# Patient Record
Sex: Male | Born: 1950 | Race: Black or African American | Hispanic: No | Marital: Single
Health system: Midwestern US, Community
[De-identification: ages and names within clinical notes are randomized; demographics above are authoritative.]

## PROBLEM LIST (undated history)

## (undated) DIAGNOSIS — I714 Abdominal aortic aneurysm, without rupture, unspecified: Secondary | ICD-10-CM

## (undated) DIAGNOSIS — K922 Gastrointestinal hemorrhage, unspecified: Secondary | ICD-10-CM

## (undated) DIAGNOSIS — E785 Hyperlipidemia, unspecified: Secondary | ICD-10-CM

## (undated) DIAGNOSIS — I10 Essential (primary) hypertension: Secondary | ICD-10-CM

## (undated) DIAGNOSIS — K219 Gastro-esophageal reflux disease without esophagitis: Secondary | ICD-10-CM

## (undated) DIAGNOSIS — E7800 Pure hypercholesterolemia, unspecified: Secondary | ICD-10-CM

## (undated) DIAGNOSIS — R3914 Feeling of incomplete bladder emptying: Secondary | ICD-10-CM

## (undated) DIAGNOSIS — K2961 Other gastritis with bleeding: Secondary | ICD-10-CM

## (undated) DIAGNOSIS — Z87891 Personal history of nicotine dependence: Secondary | ICD-10-CM

## (undated) DIAGNOSIS — N401 Enlarged prostate with lower urinary tract symptoms: Secondary | ICD-10-CM

## (undated) DIAGNOSIS — D509 Iron deficiency anemia, unspecified: Secondary | ICD-10-CM

## (undated) HISTORY — DX: Hyperlipidemia, unspecified: E78.5

## (undated) HISTORY — DX: Gastrointestinal hemorrhage, unspecified: K92.2

## (undated) HISTORY — DX: Gastro-esophageal reflux disease without esophagitis: K21.9

## (undated) HISTORY — DX: Essential (primary) hypertension: I10

## (undated) HISTORY — PX: OTHER SURGICAL HISTORY: SHX169

## (undated) HISTORY — DX: Abdominal aortic aneurysm, without rupture: I71.4

## (undated) MED ORDER — ENALAPRIL-HYDROCHLOROTHIAZIDE 5 MG-12.5 MG TAB
ORAL_TABLET | ORAL | Status: DC
Start: ? — End: 2012-12-14

## (undated) MED ORDER — LOVASTATIN 20 MG TAB
20 mg | ORAL_TABLET | Freq: Every evening | ORAL | Status: DC
Start: ? — End: 2013-11-01

## (undated) MED ORDER — RANITIDINE 150 MG CAP
150 mg | ORAL_CAPSULE | Freq: Two times a day (BID) | ORAL | Status: DC
Start: ? — End: 2013-05-31

## (undated) MED ORDER — ENALAPRIL-HYDROCHLOROTHIAZIDE 5 MG-12.5 MG TAB
ORAL_TABLET | ORAL | Status: DC
Start: ? — End: 2012-08-31

## (undated) MED ORDER — PRAVASTATIN 40 MG TAB
40 mg | ORAL_TABLET | ORAL | Status: DC
Start: ? — End: 2012-12-14

## (undated) MED ORDER — RANITIDINE 150 MG CAP
150 mg | ORAL_CAPSULE | Freq: Two times a day (BID) | ORAL | Status: DC
Start: ? — End: 2012-12-14

## (undated) MED ORDER — LOVASTATIN 20 MG TAB
20 mg | ORAL_TABLET | Freq: Every evening | ORAL | Status: DC
Start: ? — End: 2013-05-31

## (undated) MED ORDER — ENALAPRIL-HYDROCHLOROTHIAZIDE 5 MG-12.5 MG TAB
ORAL_TABLET | ORAL | Status: DC
Start: ? — End: 2013-11-01

## (undated) MED ORDER — ENALAPRIL-HYDROCHLOROTHIAZIDE 5 MG-12.5 MG TAB
ORAL_TABLET | ORAL | Status: DC
Start: ? — End: 2013-05-31

## (undated) MED ORDER — RANITIDINE 150 MG CAP
150 mg | ORAL_CAPSULE | Freq: Two times a day (BID) | ORAL | Status: DC | PRN
Start: ? — End: 2013-11-01

## (undated) MED ORDER — PRAVASTATIN 20 MG TAB
20 mg | ORAL_TABLET | ORAL | Status: DC
Start: ? — End: 2012-08-31

## (undated) MED ORDER — RANITIDINE 150 MG CAP
150 mg | ORAL_CAPSULE | Freq: Two times a day (BID) | ORAL | Status: DC
Start: ? — End: 2012-08-31

---

## 2009-10-09 LAB — METABOLIC PANEL, COMPREHENSIVE
A-G Ratio: 1 — ABNORMAL LOW (ref 1.1–2.2)
ALT (SGPT): 29 U/L (ref 12–78)
AST (SGOT): 17 U/L (ref 15–37)
Albumin: 4 g/dL (ref 3.5–5.0)
Alk. phosphatase: 103 U/L (ref 50–136)
Anion gap: 8 mmol/L (ref 5–15)
BUN/Creatinine ratio: 12 (ref 12–20)
BUN: 11 MG/DL (ref 6–20)
Bilirubin, total: 0.7 MG/DL (ref 0.2–1.0)
CO2: 27 MMOL/L (ref 21–32)
Calcium: 9.9 MG/DL (ref 8.5–10.1)
Chloride: 104 MMOL/L (ref 97–108)
Creatinine: 0.9 MG/DL (ref 0.6–1.3)
GFR est AA: >60 mL/min/{1.73_m2} (ref >60)
GFR est non-AA: >60 mL/min/{1.73_m2} (ref >60)
Globulin: 3.9 g/dL (ref 2.0–4.0)
Glucose: 78 MG/DL (ref 65–100)
Potassium: 4.6 MMOL/L (ref 3.5–5.1)
Protein, total: 7.9 g/dL (ref 6.4–8.2)
Sodium: 139 MMOL/L (ref 136–145)

## 2009-10-09 LAB — LIPID PANEL
CHOL/HDL Ratio: 6.4 — ABNORMAL HIGH (ref 0–5.0)
Cholesterol, total: 242 MG/DL — ABNORMAL HIGH (ref <200)
HDL Cholesterol: 38 MG/DL
LDL, calculated: 188.2 MG/DL — ABNORMAL HIGH (ref 0–100)
Triglyceride: 79 MG/DL (ref <150)
VLDL, calculated: 15.8 MG/DL

## 2010-11-18 LAB — METABOLIC PANEL, COMPREHENSIVE
A-G Ratio: 0.9 — ABNORMAL LOW (ref 1.1–2.2)
ALT (SGPT): 28 U/L (ref 12–78)
AST (SGOT): 20 U/L (ref 15–37)
Albumin: 4.1 g/dL (ref 3.5–5.0)
Alk. phosphatase: 106 U/L (ref 50–136)
Anion gap: 9 mmol/L (ref 5–15)
BUN/Creatinine ratio: 18 (ref 12–20)
BUN: 16 MG/DL (ref 6–20)
Bilirubin, total: 1 MG/DL (ref 0.2–1.0)
CO2: 27 MMOL/L (ref 21–32)
Calcium: 9.4 MG/DL (ref 8.5–10.1)
Chloride: 102 MMOL/L (ref 97–108)
Creatinine: 0.9 MG/DL (ref 0.6–1.3)
GFR est AA: >60 mL/min/{1.73_m2} (ref >60)
GFR est non-AA: >60 mL/min/{1.73_m2} (ref >60)
Globulin: 4.5 g/dL — ABNORMAL HIGH (ref 2.0–4.0)
Glucose: 88 MG/DL (ref 65–100)
Potassium: 4.6 MMOL/L (ref 3.5–5.1)
Protein, total: 8.6 g/dL — ABNORMAL HIGH (ref 6.4–8.2)
Sodium: 138 MMOL/L (ref 136–145)

## 2010-11-18 LAB — LIPID PANEL
CHOL/HDL Ratio: 4.4 (ref 0–5.0)
Cholesterol, total: 160 MG/DL (ref <200)
HDL Cholesterol: 36 MG/DL
LDL, calculated: 107.6 MG/DL — ABNORMAL HIGH (ref 0–100)
Triglyceride: 82 MG/DL (ref <150)
VLDL, calculated: 16.4 MG/DL

## 2011-03-31 LAB — METABOLIC PANEL, COMPREHENSIVE
A-G Ratio: 0.9 — ABNORMAL LOW (ref 1.1–2.2)
ALT (SGPT): 20 U/L (ref 12–78)
AST (SGOT): 12 U/L — ABNORMAL LOW (ref 15–37)
Albumin: 4.3 g/dL (ref 3.5–5.0)
Alk. phosphatase: 131 U/L (ref 50–136)
Anion gap: 9 mmol/L (ref 5–15)
BUN/Creatinine ratio: 13 (ref 12–20)
BUN: 13 MG/DL (ref 6–20)
Bilirubin, total: 0.9 MG/DL (ref 0.2–1.0)
CO2: 32 MMOL/L (ref 21–32)
Calcium: 10 MG/DL (ref 8.5–10.1)
Chloride: 101 MMOL/L (ref 97–108)
Creatinine: 1 MG/DL (ref 0.6–1.3)
GFR est AA: >60 mL/min/{1.73_m2} (ref >60)
GFR est non-AA: >60 mL/min/{1.73_m2} (ref >60)
Globulin: 4.6 g/dL — ABNORMAL HIGH (ref 2.0–4.0)
Glucose: 75 MG/DL (ref 65–100)
Potassium: 4.5 MMOL/L (ref 3.5–5.1)
Protein, total: 8.9 g/dL — ABNORMAL HIGH (ref 6.4–8.2)
Sodium: 142 MMOL/L (ref 136–145)

## 2011-08-20 LAB — METABOLIC PANEL, COMPREHENSIVE
A-G Ratio: 0.9 — ABNORMAL LOW (ref 1.1–2.2)
ALT (SGPT): 22 U/L (ref 12–78)
AST (SGOT): 12 U/L — ABNORMAL LOW (ref 15–37)
Albumin: 4 g/dL (ref 3.5–5.0)
Alk. phosphatase: 106 U/L (ref 50–136)
Anion gap: 7 mmol/L (ref 5–15)
BUN/Creatinine ratio: 18 (ref 12–20)
BUN: 16 MG/DL (ref 6–20)
Bilirubin, total: 0.8 MG/DL (ref 0.2–1.0)
CO2: 28 MMOL/L (ref 21–32)
Calcium: 9.6 MG/DL (ref 8.5–10.1)
Chloride: 102 MMOL/L (ref 97–108)
Creatinine: 0.87 MG/DL (ref 0.45–1.15)
GFR est AA: >60 mL/min/{1.73_m2} (ref >60)
GFR est non-AA: >60 mL/min/{1.73_m2} (ref >60)
Globulin: 4.5 g/dL — ABNORMAL HIGH (ref 2.0–4.0)
Glucose: 86 MG/DL (ref 65–100)
Potassium: 4.8 MMOL/L (ref 3.5–5.1)
Protein, total: 8.5 g/dL — ABNORMAL HIGH (ref 6.4–8.2)
Sodium: 137 MMOL/L (ref 136–145)

## 2011-11-04 DIAGNOSIS — K219 Gastro-esophageal reflux disease without esophagitis: Secondary | ICD-10-CM

## 2011-11-04 DIAGNOSIS — I10 Essential (primary) hypertension: Secondary | ICD-10-CM

## 2011-11-04 HISTORY — DX: Essential (primary) hypertension: I10

## 2011-11-04 HISTORY — DX: Gastro-esophageal reflux disease without esophagitis: K21.9

## 2011-11-04 MED ORDER — HYDROCHLOROTHIAZIDE 25 MG TAB
25 mg | ORAL_TABLET | Freq: Every day | ORAL | Status: DC
Start: 2011-11-04 — End: 2012-03-02

## 2011-11-04 MED ORDER — RANITIDINE 150 MG CAP
150 mg | ORAL_CAPSULE | Freq: Two times a day (BID) | ORAL | Status: DC
Start: 2011-11-04 — End: 2012-03-02

## 2011-11-04 NOTE — Progress Notes (Signed)
Care A Randa Ngo, RN, MSN, FNP-BC      Chief Complaint:   Chief Complaint   Patient presents with   ??? Hypertension     Pt needs refill on medication. Pt has not had any meds x 1 day.   ??? Indigestion     Pt states that meds make him feel better, but still contiunues to have sx and he needs refill on medication .       Medications:   No current outpatient prescriptions on file.         BP 183/83   Pulse 68   Temp(Src) 98.2 ??F (36.8 ??C) (Oral)   Wt 175 lb (79.379 kg)       HISTORY OF PRESENT ILLNESS  Damon [name] is a 61 y.o. male.  HPI    Review of Systems   Constitutional: Negative.    HENT: Negative.    Eyes: Negative for blurred vision and double vision.   Respiratory: Negative.    Cardiovascular: Negative.    Gastrointestinal: Negative.        Physical Exam   Constitutional: He appears well-developed.   Eyes: EOM are normal. Pupils are equal, round, and reactive to light.   Neck: Neck supple. No JVD present. No thyromegaly present.   Cardiovascular: Normal rate, regular rhythm, normal heart sounds and intact distal pulses.  Exam reveals no gallop and no friction rub.    No murmur heard.  Pulmonary/Chest: Effort normal and breath sounds normal.   Lymphadenopathy:     He has no cervical adenopathy.       ASSESSMENT and PLAN  Damon [name] was seen today for hypertension and indigestion.    Diagnoses and associated orders for this visit:    Hypertension  - hydrochlorothiazide (HYDRODIURIL) 25 mg tablet; Take 1 Tab by mouth daily for 120 days.    Gerd (gastroesophageal reflux disease)  - ranitidine hcl 150 mg capsule; Take 150 mg by mouth two (2) times a day.

## 2012-03-02 MED ORDER — RANITIDINE 150 MG CAP
150 mg | ORAL_CAPSULE | Freq: Two times a day (BID) | ORAL | Status: DC
Start: 2012-03-02 — End: 2012-06-29

## 2012-03-02 MED ORDER — ENALAPRIL-HYDROCHLOROTHIAZIDE 5 MG-12.5 MG TAB
ORAL_TABLET | ORAL | Status: DC
Start: 2012-03-02 — End: 2012-06-29

## 2012-03-02 NOTE — Progress Notes (Signed)
S: here for f/up bp and took his last HCTZ this morning. He states he feels fine now. He has not had any side effects from the HCTZ. He still smokes and add salt to most of his food.     O: Gen NAD, WDWN BP 172/113  Cor RRR  Lungs CTA  EXt no cce    A: HTN not controlled on current med  Past hx of slightly elevated cholesterol    P: enalapril/hctz 5/12.5 mg start with 2 each morning  D/c HCTZ  RTC in one month  Healthy lifestyle discussed  Consider labs soon

## 2012-03-17 NOTE — Progress Notes (Signed)
Chart review, agree with plan.  Teresita Madura, MD  03/17/2012  2:53 PM

## 2012-03-30 NOTE — Progress Notes (Signed)
Subjective:     Damon [name] is a 61 y.o. male who presents for follow up of hypertension.    Diet and Lifestyle: not attempting to follow a low sodium diet, sedentary, smoker   Home BP Monitoring: is not measured at home    Cardiovascular ROS: taking medications as instructed, no medication side effects noted, no chest pain on exertion, no dyspnea on exertion, no swelling of ankles, no orthostatic dizziness or lightheadedness.     New concerns: none.     Patient Active Problem List   Diagnoses Code   ??? Hypertension 401.9   ??? GERD (gastroesophageal reflux disease) 530.81     Current Outpatient Prescriptions   Medication Sig Dispense Refill   ??? enalapril-hydrochlorothiazide (VASERETIC) 5-12.5 mg tablet Take 2 tabs in the morning  60 Tab  3   ??? ranitidine hcl 150 mg capsule Take 150 mg by mouth two (2) times a day.  60 Cap  4     No Known Allergies          Review of Systems, additional:  Pertinent items are noted in HPI.    Objective:     BP 144/83   Pulse 57   Temp 98.3 ??F (36.8 ??C)   Wt 171 lb (77.565 kg)  Appearance: alert, well appearing, and in no distress.  General exam: CVS exam BP noted to be borderline elevated today in office, S1, S2 normal, no gallop, no murmur, chest clear, no JVD, no HSM, no edema.  Lab review: no lab studies available for review at time of visit.     Assessment/Plan:     hypertension improved.  Discussed needing to be more vigilant about a low salt diet and start an exercise routine or the next step would be adding an additional  bp medication to get [place] to goal    1. Hypertension  LIPID PANEL, METABOLIC PANEL, BASIC     Damon [name] was seen today for hypertension.    Diagnoses and associated orders for this visit:    Hypertension  - LIPID PANEL  - METABOLIC PANEL, BASIC        Follow-up Disposition: Not on File

## 2012-03-30 NOTE — Addendum Note (Signed)
Addended by: Delmar Landau on: 03/30/2012 09:04 AM     Modules accepted: Orders

## 2012-03-31 LAB — METABOLIC PANEL, BASIC
Anion gap: 8 mmol/L (ref 5–15)
BUN/Creatinine ratio: 15 (ref 12–20)
BUN: 18 MG/DL (ref 6–20)
CO2: 26 MMOL/L (ref 21–32)
Calcium: 9 MG/DL (ref 8.5–10.1)
Chloride: 101 MMOL/L (ref 97–108)
Creatinine: 1.22 MG/DL — ABNORMAL HIGH (ref 0.45–1.15)
GFR est AA: >60 mL/min/{1.73_m2} (ref >60)
GFR est non-AA: >60 mL/min/{1.73_m2} (ref >60)
Glucose: 76 MG/DL (ref 65–100)
Potassium: 4.6 MMOL/L (ref 3.5–5.1)
Sodium: 135 MMOL/L — ABNORMAL LOW (ref 136–145)

## 2012-03-31 LAB — LIPID PANEL
CHOL/HDL Ratio: 7.7 — ABNORMAL HIGH (ref 0–5.0)
Cholesterol, total: 245 MG/DL — ABNORMAL HIGH (ref <200)
HDL Cholesterol: 32 MG/DL
LDL, calculated: 189.8 MG/DL — ABNORMAL HIGH (ref 0–100)
Triglyceride: 116 MG/DL (ref <150)
VLDL, calculated: 23.2 MG/DL

## 2012-04-11 NOTE — Progress Notes (Signed)
Quick Note:    Please notify patient that his cholesterol was above goal on his recent cholesterol screening. Please review a low cholesterol diet with him, encourage him to exercise daily (30 minutes of aerobic exercise) and offer him an appointment with our nutritionist.     This patient will also need to start cholesterol medications at this point in addition to making diet and exercise changes. Please send in a prescription for the the following to his preferred pharmacy <Pravastatin 10mg  #90 sig: one tab by mouth daily for cholesterol. No refills>    Please have him return to clinic for fasting lipid profile in 6 weeks. (He can make that an appointment for follow up on his blood pressure at that time as well)        ______

## 2012-04-15 ENCOUNTER — Encounter

## 2012-04-15 MED ORDER — PRAVASTATIN 10 MG TAB
10 mg | ORAL_TABLET | ORAL | Status: DC
Start: 2012-04-15 — End: 2012-06-29

## 2012-04-15 NOTE — Progress Notes (Signed)
Quick Note:    Pc to pt to discuss lab results. Told him that his cholesterol is too high and that he needs to start a medication for this. He is currently in [place], [place] and would like the rx sent to the [facility] there. Discussed low cholesterol diet and importance of exercise. Offered nutrition appt but pt wants to wait and talk to Dr. Vita Barley at next visit. Advised him that he should rtc in 6 wks for both provider visit and fasting labs. Pt indicated understanding and had no questions. Damon Jozwiak Concha Se, RN    ______

## 2012-05-04 NOTE — Progress Notes (Signed)
Subjective:     Damon [name] is a 61 y.o. male who presents for follow up of hypertension and hyperlipidemia.    Diet and Lifestyle: generally follows a low sodium diet, exercises sporadically, smoker   Home BP Monitoring: is not measured at home    Cardiovascular ROS: taking medications as instructed, no medication side effects noted, no TIA's, no chest pain on exertion, no dyspnea on exertion, no swelling of ankles, no orthostatic dizziness or lightheadedness.       Current Outpatient Prescriptions   Medication Sig Dispense Refill   ??? pravastatin (PRAVACHOL) 10 mg tablet Take 1 tablet by mouth daily for cholesterol  90 Tab  0   ??? enalapril-hydrochlorothiazide (VASERETIC) 5-12.5 mg tablet Take 2 tabs in the morning  60 Tab  3   ??? ranitidine hcl 150 mg capsule Take 150 mg by mouth two (2) times a day.  60 Cap  4     No Known Allergies  Past Medical History   Diagnosis Date   ??? HTN (hypertension)    ??? GERD (gastroesophageal reflux disease)    ??? Hyperlipidemia           Review of Systems, additional:  Pertinent items are noted in HPI.    Objective:     BP 137/87   Pulse 56   Temp(Src) 97.8 ??F (36.6 ??C) (Oral)   Wt 174 lb (78.926 kg)  Appearance: alert, well appearing, and in no distress.  General exam: CVS exam BP noted to be well controlled today in office, S1, S2 normal, no gallop, no murmur, chest clear, no JVD,  no edema.      Assessment/Plan:     hypertension well controlled.  current treatment plan is effective, no change in therapy.   1. Hypertension      Melanie was seen today for hypertension.    Diagnoses and associated orders for this visit:    Hypertension        Follow-up Disposition:  Return in about 8 weeks (around 06/29/2012) for htn, fasting lipids..    Cont current regimen  Phone consult nutrition  Smoking, advised smoking cessation  rtc 2 months, fasting for repeat lipids.

## 2012-05-20 NOTE — Telephone Encounter (Signed)
Low cholesterol diet education provided this date.  Nutrition education materials sent this date.

## 2012-06-29 NOTE — Progress Notes (Signed)
Kendrick Ranch, MSN, RN, FNP-BC, BC-ADM  [facility]    Subjective:     Damon [name] is a 61 y.o. male who presents for follow up of hypertension.  Chief Complaint   Patient presents with   ??? Hypertension   ??? Immunization/Injection   Also with VERY HIGH chol.  BP was 120s/80s at [employer].  He has a lady friend in [place] that he visits often.    History of Present Illness:  Diet and Lifestyle: not attempting to follow a low fat, low cholesterol diet, not attempting to follow a low sodium diet, sedentary, smoker   Home BP Monitoring: is well controlled at [employer] measurements, ranging 120's/80's    Cardiovascular Review of Systems:  taking medications as instructed, no medication side effects noted, no TIA's, no chest pain on exertion, no dyspnea on exertion, no swelling of ankles  Review of Systems, Additional:  Respiratory: negative  Gastrointestinal: negative  Genitourinary:negative  Musculoskeletal:negative    Patient Active Problem List    Diagnosis Date Noted   ??? HTN (hypertension)    ??? Hyperlipidemia    ??? Hypertension 11/04/2011   ??? GERD (gastroesophageal reflux disease) 11/04/2011     Current Outpatient Prescriptions   Medication Sig Dispense Refill   ??? pravastatin (PRAVACHOL) 20 mg tablet Take 1 tablet by mouth daily for cholesterol  90 Tab  0   ??? ranitidine hcl 150 mg capsule Take 150 mg by mouth two (2) times a day.  180 Cap  0   ??? enalapril-hydrochlorothiazide (VASERETIC) 5-12.5 mg tablet Take 2 tabs in the morning  180 Tab  0   Has been spreading out the vaseretic some.  HTN - brother, sister have, mom had it;   Increase prav to 20.  Sister on dialysis.      No Known Allergies  Past Medical History   Diagnosis Date   ??? HTN (hypertension)    ??? GERD (gastroesophageal reflux disease)    ??? Hyperlipidemia      No past surgical history on file.  Family History   Problem Relation Age of Onset   ??? Hypertension Mother    ??? Hypertension Sister    ??? Hypertension Brother    ??? Kidney Disease Sister      on  dialysis     History   Substance Use Topics   ??? Smoking status: Current Everyday Smoker -- 0.5 packs/day     Types: Cigarettes   ??? Smokeless tobacco: Not on file    Comment: pack last 2 days   ??? Alcohol Use: Yes      occasional        Hospital Outpatient Visit on 03/30/2012   Component Date Value Range Status   ??? Sodium 03/30/2012 135* 136 - 145 MMOL/L Final   ??? Potassium 03/30/2012 4.6  3.5 - 5.1 MMOL/L Final   ??? Chloride 03/30/2012 101  97 - 108 MMOL/L Final   ??? CO2 03/30/2012 26  21 - 32 MMOL/L Final   ??? Anion gap 03/30/2012 8  5 - 15 mmol/L Final   ??? Glucose 03/30/2012 76  65 - 100 MG/DL Final   ??? BUN 16/06/9603 18  6 - 20 MG/DL Final   ??? Creatinine 03/30/2012 1.22* 0.45 - 1.15 MG/DL Final   ??? BUN/Creatinine ratio 03/30/2012 15  12 - 20   Final   ??? GFR est AA 03/30/2012 >60  >60 ml/min/1.69m2 Final   ??? GFR est non-AA 03/30/2012 >60  >60 ml/min/1.24m2 Final  Comment: Estimated GFR is calculated using the IDMS-traceable Modification of Diet in                            Renal Disease (MDRD) Study equation, reported for both African Americans                            (GFRAA) and non-African Americans (GFRNA), and normalized to 1.48m2 body                            surface area. The physician must decide which value applies to the patient.                           The MDRD study equation should only be used in individuals age 57 or older. It                            has not been validated for the following: pregnant women, patients with                            serious comorbid conditions, or on certain medications, or persons with                            extremes of body size, muscle mass, or nutritional status.   ??? Calcium 03/30/2012 9.0  8.5 - 10.1 MG/DL Final   ??? LIPID PROFILE 03/30/2012       Final   ??? Cholesterol, total 03/30/2012 245* <200 MG/DL Final   ??? Triglyceride 03/30/2012 116  <150 MG/DL Final    Comment: Based on NCEP-ATP III:  Triglycerides <150 mg/dL  is considered normal,                             150-199 mg/dL  borderline high,  161-096 mg/dL high and  greater than or equal                            to 500 mg/dL very high.   ??? HDL Cholesterol 03/30/2012 32   Final    Comment: Based on NCEP ATP III, HDL Cholesterol <40 mg/dL is considered low and >04                            mg/dL is elevated.   ??? LDL, calculated 03/30/2012 189.8* 0 - 100 MG/DL Final    Comment: Based on the NCEP-ATP: LDL-C concentrations are considered  optimal <100                            mg/dL,                           near optimal/above Normal 100-129 mg/dL                           Borderline High: 130-159, High: 160-189 mg/dL  Very High: Greater than or equal to 190 mg/dL   ??? VLDL, calculated 03/30/2012 23.2   Final   ??? CHOL/HDL Ratio 03/30/2012 7.7* 0 - 5.0   Final       Objective:     BP 141/82   Pulse 52   Temp 97.5 ??F (36.4 ??C) (Oral)   Wt 173 lb (78.472 kg)  Appearance: alert, well appearing, and in no distress.  General exam: CVS exam BP noted to be well controlled today in office, S1, S2 normal, no gallop, no murmur, chest clear, no JVD, no HSM, no edema.  Neck: supple, symmetrical, trachea midline, no adenopathy, thyroid: not enlarged, symmetric, no tenderness/mass/nodules, no carotid bruit and no JVD  Lungs: clear to auscultation bilaterally  Abdomen: soft, non-tender. Bowel sounds normal. No masses,  no organomegaly  Pulses: 2+ and symmetric    Assessment/Plan:     hypertension well controlled, hyperlipidemia poorly controlled.  1. HTN (hypertension)  METABOLIC PANEL, BASIC   2. Hyperlipidemia  LIPID PANEL   3. GERD (gastroesophageal reflux disease)  ranitidine hcl 150 mg capsule   4. Hyperlipemia  pravastatin (PRAVACHOL) 20 mg tablet   5. Hypertension  enalapril-hydrochlorothiazide (VASERETIC) 5-12.5 mg tablet   6. Need for influenza vaccination  INFLUENZA VIRUS VAC QUAD,SPLIT,PRESV,FREE 3/> YRS IM     Orders Placed This Encounter   ??? Influenza vaccine, Quadrivalent, PF, 3/> yrs,  IM   ??? LIPID PANEL     Standing Status: Future      Number of Occurrences:       Standing Expiration Date: 12/28/2012   ??? METABOLIC PANEL, BASIC     Standing Status: Future      Number of Occurrences:       Standing Expiration Date: 12/28/2012   ??? pravastatin (PRAVACHOL) 20 mg tablet     Sig: Take 1 tablet by mouth daily for cholesterol     Dispense:  90 Tab     Refill:  0   ??? ranitidine hcl 150 mg capsule     Sig: Take 150 mg by mouth two (2) times a day.     Dispense:  180 Cap     Refill:  0   ??? enalapril-hydrochlorothiazide (VASERETIC) 5-12.5 mg tablet     Sig: Take 2 tabs in the morning     Dispense:  180 Tab     Refill:  0     Follow-up Disposition:  Return in about 8 days (around 07/07/2012) for 8-10 weeks, monitor creatinine/lipids/HTN.    -needs fasting lipids, repeat creatinine  -started taking 325mg  ASA  -lipids, cmp    40+ years, 1/2 PPD, used to be 1PPD  Tried a year ago to quit, made it 4-5 days, had overwhelming cravings, started smoking again.

## 2012-06-29 NOTE — Progress Notes (Signed)
The following was documented by Leonard Hendler, RN.  Immunization given per protocol.  Documented in [facility] and updated copy given to patient. [facility] information sheet given with instructions concerning adverse effects and allergic reaction which would indicate need to be seen in the ER. Patieent expressed understanding.  Pt had no adverse reaction at time of discharge.  Kamaljit Hizer, RN

## 2012-06-30 LAB — METABOLIC PANEL, BASIC
Anion gap: 5 mmol/L (ref 5–15)
BUN/Creatinine ratio: 15 (ref 12–20)
BUN: 16 MG/DL (ref 6–20)
CO2: 29 MMOL/L (ref 21–32)
Calcium: 9.7 MG/DL (ref 8.5–10.1)
Chloride: 102 MMOL/L (ref 97–108)
Creatinine: 1.05 MG/DL (ref 0.45–1.15)
GFR est AA: >60 mL/min/{1.73_m2} (ref >60)
GFR est non-AA: >60 mL/min/{1.73_m2} (ref >60)
Glucose: 75 MG/DL (ref 65–100)
Potassium: 4.2 MMOL/L (ref 3.5–5.1)
Sodium: 136 MMOL/L (ref 136–145)

## 2012-06-30 LAB — LIPID PANEL
CHOL/HDL Ratio: 6.3 — ABNORMAL HIGH (ref 0–5.0)
Cholesterol, total: 209 MG/DL — ABNORMAL HIGH (ref <200)
HDL Cholesterol: 33 MG/DL
LDL, calculated: 155.2 MG/DL — ABNORMAL HIGH (ref 0–100)
Triglyceride: 104 MG/DL (ref <150)
VLDL, calculated: 20.8 MG/DL

## 2012-06-30 NOTE — Progress Notes (Signed)
Quick Note:    Cholesterol has gone up; will need to review chart and he will need to take double the cholesterol medication.  ______

## 2012-08-22 NOTE — Progress Notes (Signed)
Quick Note:    LDL 155, down from 189 but should be < 100. I recommend increasing pravastatin to 40mg , 1 po qhs, meet with nutritionist to discuss low chol diet, and recheck 3 months.  Will have nurses discuss with patient, then plan to e-script rx to his pharmacy of choice.  ______

## 2012-08-24 NOTE — Progress Notes (Signed)
Quick Note:    Called pt today. At this point, he has a follow up with you on 12/11. He will double up on his pravastatin so that he takes 2 20 mg tabs between this week and when he sees you. I told him Misty Stanley could reorder at the new does when he sees you next week. Also, told him that Misty Stanley suggested a follow up with nutrition and that he could discuss with provider and make follow up appt with nutrition as he leaves on the 11th. Ewing Schlein, RN    ______

## 2012-08-31 NOTE — Progress Notes (Signed)
Kendrick Ranch, MSN, RN, FNP-BC, BC-ADM  [facility]    Subjective:     Damon [name] is a 61 y.o. male who presents for follow up of hypertension.  Chief Complaint   Patient presents with   ??? Cholesterol Problem     Cholestero Recheck, Lab results   ??? Hypertension     Damon [name] has not run out of medications.    Cardiovascular ROS: taking medications as instructed, no medication side effects noted, no TIA's, no chest pain on exertion, no dyspnea on exertion, no swelling of ankles.     New concerns: high cholesterol.     Patient Active Problem List    Diagnosis Date Noted   ??? Hyperlipidemia    ??? Hypertension 11/04/2011   ??? GERD (gastroesophageal reflux disease) 11/04/2011     Current Outpatient Prescriptions   Medication Sig Dispense Refill   ??? pravastatin (PRAVACHOL) 20 mg tablet Take 2 tablets by mouth daily for cholesterol - he has made this change  90 Tab  0   ??? ranitidine hcl 150 mg capsule Take 150 mg by mouth two (2) times a day.  180 Cap  0   ??? enalapril-hydrochlorothiazide (VASERETIC) 5-12.5 mg tablet Take 2 tabs in the morning  180 Tab  0     No current facility-administered medications for this visit.     No Known Allergies  Past Medical History   Diagnosis Date   ??? HTN (hypertension)    ??? GERD (gastroesophageal reflux disease)    ??? Hyperlipidemia      No past surgical history on file.  Family History   Problem Relation Age of Onset   ??? Hypertension Mother    ??? Hypertension Sister    ??? Hypertension Brother    ??? Kidney Disease Sister      on dialysis     History   Substance Use Topics   ??? Smoking status: Current Every Day Smoker -- 0.50 packs/day     Types: Cigarettes   ??? Smokeless tobacco: Not on file      Comment: pack last 2 days   ??? Alcohol Use: Yes      Comment: occasional        Hospital Outpatient Visit on 06/29/2012   Component Date Value Range Status   ??? LIPID PROFILE 06/29/2012       Final   ??? Cholesterol, total 06/29/2012 209* <200 MG/DL Final   ??? Triglyceride 06/29/2012 104  <150 MG/DL Final     Comment: Based on NCEP-ATP III:  Triglycerides <150 mg/dL  is considered normal,                            150-199 mg/dL  borderline high,  161-096 mg/dL high and  greater than or equal                            to 500 mg/dL very high.   ??? HDL Cholesterol 06/29/2012 33   Final    Comment: Based on NCEP ATP III, HDL Cholesterol <40 mg/dL is considered low and >04                            mg/dL is elevated.   ??? LDL, calculated 06/29/2012 155.2* 0 - 100 MG/DL Final    Comment: Based on the NCEP-ATP: LDL-C concentrations are considered  optimal <100                            mg/dL,                           near optimal/above Normal 100-129 mg/dL                           Borderline High: 130-159, High: 160-189 mg/dL                           Very High: Greater than or equal to 190 mg/dL   ??? VLDL, calculated 06/29/2012 20.8   Final   ??? CHOL/HDL Ratio 06/29/2012 6.3* 0 - 5.0   Final   ??? Sodium 06/29/2012 136  136 - 145 MMOL/L Final   ??? Potassium 06/29/2012 4.2  3.5 - 5.1 MMOL/L Final   ??? Chloride 06/29/2012 102  97 - 108 MMOL/L Final   ??? CO2 06/29/2012 29  21 - 32 MMOL/L Final   ??? Anion gap 06/29/2012 5  5 - 15 mmol/L Final   ??? Glucose 06/29/2012 75  65 - 100 MG/DL Final   ??? BUN 57/84/6962 16  6 - 20 MG/DL Final   ??? Creatinine 06/29/2012 1.05  0.45 - 1.15 MG/DL Final   ??? BUN/Creatinine ratio 06/29/2012 15  12 - 20   Final   ??? GFR est AA 06/29/2012 >60  >60 ml/min/1.25m2 Final   ??? GFR est non-AA 06/29/2012 >60  >60 ml/min/1.67m2 Final    Comment: Estimated GFR is calculated using the IDMS-traceable Modification of Diet in                            Renal Disease (MDRD) Study equation, reported for both African Americans                            (GFRAA) and non-African Americans (GFRNA), and normalized to 1.67m2 body                            surface area. The physician must decide which value applies to the patient.                           The MDRD study equation should only be used in individuals age 59 or  older. It                            has not been validated for the following: pregnant women, patients with                            serious comorbid conditions, or on certain medications, or persons with                            extremes of body size, muscle mass, or nutritional status.   ??? Calcium 06/29/2012 9.7  8.5 - 10.1 MG/DL Final       Review of Systems, additional:  Pertinent items are noted in HPI.    Objective:  BP 127/73   Pulse 68   Temp(Src) 98.1 ??F (36.7 ??C) (Oral)   Wt 174 lb (78.926 kg)  Appearance: alert, well appearing, and in no distress.  General exam: [facility]S exam BP noted to be well controlled today in office, S1, S2 normal, no gallop, no murmur, chest clear, no JVD, no HSM, no edema.    Assessment/Plan:     hypertension well controlled, hyperlipidemia poorly controlled.  1. Hyperlipemia  pravastatin (PRAVACHOL) 40 mg tablet (understands that with this new rx he need only take ONE tablet po qhs)   2. HTN (hypertension)  enalapril-hydrochlorothiazide (VASERETIC) 5-12.5 mg tablet   3. GERD (gastroesophageal reflux disease)  ranitidine hcl 150 mg capsule          Orders Placed This Encounter   ??? pravastatin (PRAVACHOL) 40 mg tablet     Sig: Take 1 tablet by mouth daily for cholesterol     Dispense:  90 Tab     Refill:  0   ??? ranitidine hcl 150 mg capsule     Sig: Take 150 mg by mouth two (2) times a day.     Dispense:  180 Cap     Refill:  0   ??? enalapril-hydrochlorothiazide (VASERETIC) 5-12.5 mg tablet     Sig: Take 2 tabs in the morning     Dispense:  180 Tab     Refill:  0     Follow-up Disposition:  Return in about 3 months (around 11/29/2012) for fasting labs, lipid panel, in 2.5 months.    -follow up 3  Months  -follow up 2.5 months fasting lipids

## 2012-11-16 LAB — AMB POC GLUCOSE BLOOD, BY GLUCOSE MONITORING DEVICE: Glucose POC: 125 mg/dL

## 2012-11-16 NOTE — Progress Notes (Signed)
11/16/2012  [facility]  [facility] [employer]  Phone: [phone]      Fax: [phone]    Subjective:     Damon [name] is a 62 y.o. male.  Chief Complaint   Patient presents with   ??? Hypertension   ??? Cholesterol Problem   ??? Labs Only     History of Present Illness/Summary of last visit:  Smoking less than previously.  Now 1 pack lasts 2 days.  He recalls a paternal uncle who died from diabetes in his 65s.  He died from heart trouble, had HTN.  Occ cough which is relieved at times by smoking, not bothering him at night.  No emesis or hematemesis.  No n/v.  No fever/chills.  No chest pain/shortness of breath with activity or rest.    Review of Systems:  See HPI.    Outpatient Prescriptions Prior to Visit   Medication Sig Dispense Refill   ??? aspirin delayed-release 81 mg tablet Take  by mouth daily.       ??? pravastatin (PRAVACHOL) 40 mg tablet Take 1 tablet by mouth daily for cholesterol  90 Tab  0   ??? ranitidine hcl 150 mg capsule Take 150 mg by mouth two (2) times a day.  180 Cap  0   ??? enalapril-hydrochlorothiazide (VASERETIC) 5-12.5 mg tablet Take 2 tabs in the morning  180 Tab  0     No facility-administered medications prior to visit.     There are no discontinued medications.  No Known Allergies  Patient Active Problem List    Diagnosis Date Noted   ??? Hyperlipidemia    ??? Hypertension 11/04/2011   ??? GERD (gastroesophageal reflux disease) 11/04/2011     Past Medical History   Diagnosis Date   ??? HTN (hypertension)    ??? GERD (gastroesophageal reflux disease)    ??? Hyperlipidemia       reports that he has been smoking Cigarettes.  He started smoking about 44 years ago. He has a 22 pack-year smoking history. He does not have any smokeless tobacco history on file. He reports that  drinks alcohol. He reports that he does not use illicit drugs.    Objective:     There were no vitals taken for this visit.    Component Value Date/Time   Cholesterol, total 209 06/29/2012  2:51 PM   HDL Cholesterol 33  06/29/2012  2:51 PM   LDL, calculated 155.2 06/29/2012  2:51 PM   Triglyceride 104 06/29/2012  2:51 PM   CHOL/HDL Ratio 6.3 06/29/2012  2:51 PM     Lab Results   Component Value Date/Time    GFR est AA >60 06/29/2012  2:51 PM    GFR est non-AA >60 06/29/2012  2:51 PM    Creatinine 1.05 06/29/2012  2:51 PM    BUN 16 06/29/2012  2:51 PM    Sodium 136 06/29/2012  2:51 PM    Potassium 4.2 06/29/2012  2:51 PM    Chloride 102 06/29/2012  2:51 PM    CO2 29 06/29/2012  2:51 PM       Physical Examination:   General appearance - well developed, no acute distress.  Neck - supple, no lymphadenopathy, no thyromegaly.    Chest - clear to auscultation.  Heart - regular rate and rhythm without murmurs, rubs, or gallops.  Abdomen - bowel sounds normoactive x 4, nontender, nondistended.  Extremities - pulses intact.  No peripheral edema.    Discussion/counseling regarding smoking cessation:  he was able to identify the times when he most wants to smoke.  Those times are first thing in the morning and after a meal.  He was able to articulate that smoking has a relaxing effect.  At times he has gotten to coughing and smoking will actually calm his cough.    Discussed that for the first 9-12 months after quitting many people do find they cough more initially and it takes a while for the cough to improve.  The good news is, after that timeframe, the cough is often much better.  He would like to quit, knows it's bad for him, but it's very difficult, especially for those times when he really wants a cigarette.  Discussed even if unable to quit completely at this time, every less cigarette he is able to smoke is an added health benefit for him.  He began to think about other ways that he knows how to relax and do not involve smoking.    Assessment / Plan:     1. Encounter for smoking cessation counseling     2. Hyperlipidemia     3. Hypertension  AMB POC GLUCOSE BLOOD, BY GLUCOSE MONITORING DEVICE   4. GERD (gastroesophageal reflux disease)       Orders  Placed This Encounter   ??? AMB POC GLUCOSE BLOOD, BY GLUCOSE MONITORING DEVICE     Follow-up Disposition:  Return in about 4 weeks (around 12/14/2012) for has appt [facility].  Plan:  Already has follow up scheduled with [facility]K for 12/14/12.  We will need to switch his pravastatin to something more affordable.  Given his previous LDL of 155, I am anticipating switching to simvastatin.  The [employer] have a good price on simvastatin 40mg .  He is in the contemplative stage of change, beginning to transition toward the planning stage.  Will continue to facilitate movement in this direction, as he is ready.

## 2012-11-16 NOTE — Progress Notes (Signed)
I draw blood per [facility] Lipid panel, CMP

## 2012-11-17 LAB — METABOLIC PANEL, COMPREHENSIVE
A-G Ratio: 1 — ABNORMAL LOW (ref 1.1–2.2)
ALT (SGPT): 16 U/L (ref 12–78)
AST (SGOT): 9 U/L — ABNORMAL LOW (ref 15–37)
Albumin: 4.1 g/dL (ref 3.5–5.0)
Alk. phosphatase: 105 U/L (ref 50–136)
Anion gap: 9 mmol/L (ref 5–15)
BUN/Creatinine ratio: 21 — ABNORMAL HIGH (ref 12–20)
BUN: 19 MG/DL (ref 6–20)
Bilirubin, total: 0.6 MG/DL (ref 0.2–1.0)
CO2: 27 mmol/L (ref 21–32)
Calcium: 9.8 MG/DL (ref 8.5–10.1)
Chloride: 101 mmol/L (ref 97–108)
Creatinine: 0.92 MG/DL (ref 0.45–1.15)
GFR est AA: >60 mL/min/{1.73_m2} (ref >60)
GFR est non-AA: >60 mL/min/{1.73_m2} (ref >60)
Globulin: 4 g/dL (ref 2.0–4.0)
Glucose: 76 mg/dL (ref 65–100)
Potassium: 4.3 mmol/L (ref 3.5–5.1)
Protein, total: 8.1 g/dL (ref 6.4–8.2)
Sodium: 137 mmol/L (ref 136–145)

## 2012-11-17 LAB — LIPID PANEL
CHOL/HDL Ratio: 4.4 (ref 0–5.0)
Cholesterol, total: 176 MG/DL (ref <200)
HDL Cholesterol: 40 MG/DL
LDL, calculated: 118.4 MG/DL — ABNORMAL HIGH (ref 0–100)
Triglyceride: 88 MG/DL (ref <150)
VLDL, calculated: 17.6 MG/DL

## 2012-11-23 NOTE — Progress Notes (Signed)
Chart review, agree.

## 2012-11-23 NOTE — Progress Notes (Signed)
Quick Note:    Lipids improved; cmp normal.  ______

## 2012-12-01 NOTE — Progress Notes (Signed)
Quick Note:    Cholesterol is better, cmp normal; dialtell.  ______

## 2012-12-14 NOTE — Progress Notes (Signed)
Kendrick Ranch, MSN, RN, FNP-BC, BC-ADM  Office Visit  12/14/2012  [facility]    Subjective:   Damon [name] is a 62 y.o. male.  Chief Complaint   Patient presents with   ??? Hypertension     History of Present Illness:  Feeling well.  Still smoking about 1/2 PPD.  Review of Systems:  Constitutional: No fever.  Eyes: No visual changes.  Cardiovascular: No chest pain.  No leg swelling.  Respiratory: No shortness of breath.    Past Medical History:  Current Outpatient Prescriptions   Medication Sig Dispense Refill   ??? lovastatin (MEVACOR) 20 mg tablet Take 2 Tabs by mouth nightly. For cholesterol.  This is in place of Pravastatin.  180 Tab  1   ??? enalapril-hydrochlorothiazide (VASERETIC) 5-12.5 mg tablet Take 2 tabs in the morning  180 Tab  1   ??? ranitidine hcl 150 mg capsule Take 150 mg by mouth two (2) times a day.  180 Cap  1   ??? aspirin delayed-release 81 mg tablet Take  by mouth daily.         No Known Allergies   reports that he has been smoking Cigarettes.  He started smoking about 45 years ago. He has a 22 pack-year smoking history. He does not have any smokeless tobacco history on file. He reports that  drinks alcohol. He reports that he does not use illicit drugs.    Objective:   BP 112/70   Pulse 82   Temp(Src) 97.5 ??F (36.4 ??C) (Oral)   Wt 177 lb (80.287 kg)  No results found for any visits on 12/14/12.  Wt Readings from Last 3 Encounters:   12/14/12 177 lb (80.287 kg)   08/31/12 174 lb (78.926 kg)   06/29/12 173 lb (78.472 kg)     Filed Vitals:    12/14/12 0859 12/14/12 0902 12/14/12 0952   BP: 145/83 149/79 112/70   Pulse: 84 82    Temp: 97.5 ??F (36.4 ??C)     TempSrc: Oral     Weight: 177 lb (80.287 kg)           Physical Examination:   General appearance - well developed, no acute distress.  Neck - supple, no lymphadenopathy, no thyromegaly.    Chest - clear to auscultation.  Heart - regular rate and rhythm without murmurs, rubs, or gallops.  Abdomen - bowel sounds normoactive x 4,  nontender, nondistended.  Extremities - pulses intact.  No peripheral edema.    Assessment / Plan:       ICD-9-CM    1. Hypertension 401.9 enalapril-hydrochlorothiazide (VASERETIC) 5-12.5 mg tablet   2. GERD (gastroesophageal reflux disease) 530.81 ranitidine hcl 150 mg capsule   3. Hyperlipidemia 272.4 lovastatin (MEVACOR) 20 mg tablet     Orders Placed This Encounter   ??? lovastatin (MEVACOR) 20 mg tablet     Sig: Take 2 Tabs by mouth nightly. For cholesterol.  This is in place of Pravastatin.     Dispense:  180 Tab     Refill:  1   ??? enalapril-hydrochlorothiazide (VASERETIC) 5-12.5 mg tablet     Sig: Take 2 tabs in the morning     Dispense:  180 Tab     Refill:  1   ??? ranitidine hcl 150 mg capsule     Sig: Take 150 mg by mouth two (2) times a day.     Dispense:  180 Cap     Refill:  1  Follow-up Disposition:  Return in about 6 months (around 06/16/2013).

## 2013-05-31 NOTE — Progress Notes (Signed)
Kendrick Ranch, MSN, RN, FNP-BC, BC-ADM  Office Visit    05/31/2013  [facility]  Subjective:     Damon [name] is a 62 y.o. male who returns for follow up.  Chief Complaint   Patient presents with   ??? Hypertension     f/u   ??? Cholesterol Problem     f/u   ??? Medication Refill     Review of Systems:  Negative for chest pain, shortness of breath, palpitations.  Medications:  Current Outpatient Prescriptions   Medication Sig Dispense Refill   ??? lovastatin (MEVACOR) 20 mg tablet Take 2 Tabs by mouth nightly. For cholesterol.  This is in place of Pravastatin.  180 Tab  1   ??? enalapril-hydrochlorothiazide (VASERETIC) 5-12.5 mg tablet Take 2 tabs in the morning  180 Tab  1   ??? ranitidine hcl 150 mg capsule Take 150 mg by mouth two (2) times a day.  180 Cap  1   ??? aspirin delayed-release 81 mg tablet Take  by mouth daily.         Allergies:  No Known Allergies  Past Medical History:  Past Medical History   Diagnosis Date   ??? HTN (hypertension)    ??? GERD (gastroesophageal reflux disease)    ??? Hyperlipidemia      Social History:  reports that he has been smoking Cigarettes.  He started smoking about 45 years ago. He has a 22 pack-year smoking history. He does not have any smokeless tobacco history on file. He reports that  drinks alcohol. He reports that he does not use illicit drugs.      Objective:     BP 141/83   Pulse 63   Temp(Src) 97.9 ??F (36.6 ??C) (Oral)   Wt 172 lb 12.8 oz (78.382 kg)  No results found for any visits on 05/31/13.  No results found for this basename: HBA1C, HGBE8, HA1CLT     Wt Readings from Last 3 Encounters:   05/31/13 172 lb 12.8 oz (78.382 kg)   12/14/12 177 lb (80.287 kg)   08/31/12 174 lb (78.926 kg)       Physical Examination:   General appearance - well developed, no acute distress.  Neck - supple, no lymphadenopathy, no thyromegaly.    Chest - clear to auscultation.  Heart - regular rate and rhythm without murmurs, rubs, or gallops.  Abdomen - bowel sounds normoactive x 4,  nontender, nondistended.  Extremities - pulses intact.  No peripheral edema.    Assessment / Plan:       ICD-9-CM    1. Hyperlipidemia 272.4 lovastatin (MEVACOR) 20 mg tablet   2. Hypertension 401.9 enalapril-hydrochlorothiazide (VASERETIC) 5-12.5 mg tablet   3. GERD (gastroesophageal reflux disease) 530.81 ranitidine hcl 150 mg capsule

## 2013-11-01 MED ORDER — ENALAPRIL-HYDROCHLOROTHIAZIDE 5 MG-12.5 MG TAB
ORAL_TABLET | ORAL | Status: DC
Start: 2013-11-01 — End: 2014-07-04

## 2013-11-01 MED ORDER — RANITIDINE 150 MG CAP
150 mg | ORAL_CAPSULE | Freq: Two times a day (BID) | ORAL | Status: DC | PRN
Start: 2013-11-01 — End: 2014-07-04

## 2013-11-01 MED ORDER — LOVASTATIN 20 MG TAB
20 mg | ORAL_TABLET | Freq: Every evening | ORAL | Status: DC
Start: 2013-11-01 — End: 2014-07-04

## 2013-11-01 NOTE — Progress Notes (Signed)
An After Visit Summary was printed and given to the patient. Earl Gala, RN

## 2013-11-01 NOTE — Progress Notes (Signed)
1. Have you been to the ER, urgent care clinic since your last visit?  Hospitalized since your last visit?No    2. Have you seen or consulted any other health care providers outside of the [facility] since your last visit?  Include any pap smears or colon screening. No

## 2013-11-01 NOTE — Progress Notes (Signed)
Subjective:     Chief Complaint   Patient presents with   ??? Hypertension     f/u   ??? Medication Refill     Damon [name] is a 63 y.o. male who presents for follow up of hypertension and hyperlipidemia.    Diet and Lifestyle: generally follows a low fat low cholesterol diet, not attempting to follow a low sodium diet  Home BP Monitoring: is not measured at home    Cardiovascular ROS: taking medications as instructed, no medication side effects noted, no TIA's, no chest pain on exertion, no dyspnea on exertion, no swelling of ankles.      Review of Systems, additional:  Pertinent items are noted in HPI.    Objective:     BP 144/85    Pulse 52    Temp(Src) 97.9 ??F (36.6 ??C) (Oral)    Ht 5\' 10"  (1.778 m)    Wt 174 lb (78.926 kg)    BMI 24.97 kg/m2     Wt Readings from Last 3 Encounters:   11/01/13 174 lb (78.926 kg)   05/31/13 172 lb 12.8 oz (78.382 kg)   12/14/12 177 lb (80.287 kg)     Appearance: alert, well appearing, and in no distress.  General exam: CVS exam BP noted to be mildly elevated today in office, S1, S2 normal, no gallop, no murmur, chest clear, no JVD, no HSM, no edema.  Lab review: no lab studies available for review at time of visit.     Assessment/Plan:     hypertension stable, hyperlipidemia control uncertain.  reviewed diet, exercise and weight control  recommended sodium restriction  very strongly urged to quit smoking to reduce cardiovascular risk.     ICD-9-CM    1. Hyperlipidemia 272.4 LIPID PANEL     lovastatin (MEVACOR) 20 mg tablet     COLLECTION VENOUS BLOOD,VENIPUNCTURE     SPECIMEN HANDLING,DR OFF->LAB   2. Hypertension 401.9 enalapril-hydrochlorothiazide (VASERETIC) 5-12.5 mg tablet   3. GERD (gastroesophageal reflux disease) 530.81 ranitidine hcl 150 mg capsule     Follow-up Disposition:  Return in about 5 months (around 03/31/2014) for return before your medicines run out.

## 2013-11-02 LAB — LIPID PANEL
CHOL/HDL Ratio: 4 (ref 0–5.0)
Cholesterol, total: 175 MG/DL (ref <200)
HDL Cholesterol: 44 MG/DL
LDL, calculated: 112.4 MG/DL — ABNORMAL HIGH (ref 0–100)
Triglyceride: 93 MG/DL (ref <150)
VLDL, calculated: 18.6 MG/DL

## 2014-02-26 NOTE — Progress Notes (Signed)
Quick Note:    Cholesterol in February looks good. Please return as planned in July. [place].  ______

## 2014-07-04 ENCOUNTER — Ambulatory Visit: Admit: 2014-07-04 | Discharge: 2014-07-04 | Attending: Family | Primary: Internal Medicine

## 2014-07-04 DIAGNOSIS — E785 Hyperlipidemia, unspecified: Secondary | ICD-10-CM

## 2014-07-04 MED ORDER — LOVASTATIN 20 MG TAB
20 mg | ORAL_TABLET | Freq: Every evening | ORAL | Status: DC
Start: 2014-07-04 — End: 2014-11-28

## 2014-07-04 MED ORDER — RANITIDINE 150 MG CAP
150 mg | ORAL_CAPSULE | Freq: Two times a day (BID) | ORAL | Status: DC | PRN
Start: 2014-07-04 — End: 2014-07-05

## 2014-07-04 MED ORDER — ENALAPRIL-HYDROCHLOROTHIAZIDE 5 MG-12.5 MG TAB
ORAL_TABLET | ORAL | Status: DC
Start: 2014-07-04 — End: 2014-12-26

## 2014-07-04 NOTE — Progress Notes (Signed)
[facility]  Subjective:      Chief Complaint   Patient presents with   ??? Hypertension     f/u, Pt c/o pain on right side of abdomen when he coughed last week.   ??? Cholesterol Problem     f/u   ??? Medication Refill     History of Present Illness:  Soreness right lower quadrant last week, felt like he had been punched, feels fine now.  Stool was dark.  Not black in color or sticky.  Hurt when he coughed. No changes in urination.  Past Surgical History:  History reviewed. No pertinent past surgical history.  Review of Systems:  Negative for: fever, chest pain, shortness of breath, leg swelling, exertional dyspnea, palpitations; denies nausea, vomiting, diarrhea, constipation, bright red blood in stool, coughing up blood.  Objective:   BP 135/80 mmHg   Pulse 58   Temp(Src) 97.6 ??F (36.4 ??C) (Oral)   Wt 170 lb (77.111 kg)   SpO2 97%  Lab Review: No results found for any visits on 07/04/14.   Wt Readings from Last 3 Encounters:   07/04/14 170 lb (77.111 kg)   11/01/13 174 lb (78.926 kg)   05/31/13 172 lb 12.8 oz (78.382 kg)       Physical Examination:   General appearance - well developed, no acute distress.   Chest - clear to auscultation.  Heart - regular rate and rhythm without murmurs, rubs, or gallops.  Abdomen - soft, non-tender. Bowel sounds normal. No masses,  no organomegaly  Extremities - pulses intact.  No peripheral edema.    Assessment/Plan:       ICD-10-CM ICD-9-CM    1. Hyperlipidemia E78.5 272.4 lovastatin (MEVACOR) 20 mg tablet   2. Essential hypertension I10 401.9 enalapril-hydrochlorothiazide (VASERETIC) 5-12.5 mg tablet   3. Gastroesophageal reflux disease without esophagitis K21.9 530.81 ranitidine hcl 150 mg capsule     Follow-up Disposition: Not on File  Plan:   -labs next time

## 2014-07-04 NOTE — Progress Notes (Signed)
1. Have you been to the ER, urgent care clinic since your last visit?  Hospitalized since your last visit?No    2. Have you seen or consulted any other health care providers outside of the [facility] since your last visit?  Include any pap smears or colon screening. No

## 2014-07-05 ENCOUNTER — Encounter

## 2014-07-05 MED ORDER — RANITIDINE 150 MG TAB
150 mg | ORAL_TABLET | Freq: Two times a day (BID) | ORAL | Status: DC | PRN
Start: 2014-07-05 — End: 2015-02-15

## 2014-07-05 NOTE — Telephone Encounter (Signed)
Pharmacy asked for med to be ordered as tabs and not capsules due to cost so I changed it to tabs. Mancel Bale, RN

## 2014-11-28 ENCOUNTER — Institutional Professional Consult (permissible substitution): Admit: 2014-11-28 | Discharge: 2014-11-28 | Primary: Internal Medicine

## 2014-11-28 DIAGNOSIS — E785 Hyperlipidemia, unspecified: Secondary | ICD-10-CM

## 2014-11-28 MED ORDER — LOVASTATIN 20 MG TAB
20 mg | ORAL_TABLET | Freq: Every evening | ORAL | Status: DC
Start: 2014-11-28 — End: 2014-12-26

## 2014-11-28 NOTE — Progress Notes (Signed)
Statements below were documented by Patsy Lager.Pt here to be seen but pt now has insurance. Pt normally sees Lattie Haw, spoke with Catha Nottingham about pt, gave him referral to [facility] but he also states he only has 2 weeks of lovastatin left. Gave pt a month refill per ok from [employer]. Pt will make appt for [facility] financial screening before leaving.

## 2014-12-26 ENCOUNTER — Encounter: Admit: 2014-12-26 | Discharge: 2014-12-26 | Payer: MEDICARE | Primary: Internal Medicine

## 2014-12-26 ENCOUNTER — Institutional Professional Consult (permissible substitution): Admit: 2014-12-26 | Discharge: 2014-12-26 | Payer: MEDICARE | Primary: Internal Medicine

## 2014-12-26 DIAGNOSIS — E785 Hyperlipidemia, unspecified: Secondary | ICD-10-CM

## 2014-12-26 MED ORDER — LOVASTATIN 20 MG TAB
20 mg | ORAL_TABLET | Freq: Every evening | ORAL | Status: DC
Start: 2014-12-26 — End: 2015-02-15

## 2014-12-26 MED ORDER — ENALAPRIL-HYDROCHLOROTHIAZIDE 5 MG-12.5 MG TAB
ORAL_TABLET | ORAL | Status: DC
Start: 2014-12-26 — End: 2014-12-26

## 2014-12-26 MED ORDER — ENALAPRIL-HYDROCHLOROTHIAZIDE 5 MG-12.5 MG TAB
ORAL_TABLET | ORAL | Status: DC
Start: 2014-12-26 — End: 2015-02-15

## 2014-12-26 MED ORDER — LOVASTATIN 20 MG TAB
20 mg | ORAL_TABLET | Freq: Every evening | ORAL | Status: DC
Start: 2014-12-26 — End: 2014-12-26

## 2014-12-26 NOTE — Telephone Encounter (Signed)
Opened in error

## 2014-12-26 NOTE — Progress Notes (Signed)
Patient here for f/s update because he now has Medicare. His appt is scheduled for 5/27 because pt will be traveling and is requesting a refill to take him through appt date. Previous note from nurse visit on 3/9 read. Refills for lovastatin and enalapril-hydrochlorothiazide. Per verbal okay and medication protocol, authorizing refill.

## 2014-12-26 NOTE — Addendum Note (Signed)
Addended by: Dirk Dress T on: 12/26/2014 02:12 PM      Modules accepted: Orders

## 2014-12-26 NOTE — Progress Notes (Signed)
Addendum to nurse visit for refill: Needed to route [facility] medications [facility] in [place], [place] so had to dc the previous refill order and re-order.

## 2015-02-15 ENCOUNTER — Ambulatory Visit: Admit: 2015-02-15 | Discharge: 2015-02-15 | Payer: MEDICARE | Attending: Family | Primary: Internal Medicine

## 2015-02-15 DIAGNOSIS — I10 Essential (primary) hypertension: Secondary | ICD-10-CM

## 2015-02-15 MED ORDER — LISINOPRIL-HYDROCHLOROTHIAZIDE 20 MG-25 MG TAB
20-25 mg | ORAL_TABLET | Freq: Every day | ORAL | Status: DC
Start: 2015-02-15 — End: 2015-09-04

## 2015-02-15 MED ORDER — LOVASTATIN 40 MG TAB
40 mg | ORAL_TABLET | Freq: Every evening | ORAL | Status: DC
Start: 2015-02-15 — End: 2015-07-04

## 2015-02-15 MED ORDER — RANITIDINE 150 MG TAB
150 mg | ORAL_TABLET | Freq: Two times a day (BID) | ORAL | Status: DC | PRN
Start: 2015-02-15 — End: 2015-06-27

## 2015-02-15 NOTE — Progress Notes (Signed)
1. Have you been to the ER, urgent care clinic since your last visit?  Hospitalized since your last visit?No    2. Have you seen or consulted any other health care providers outside of the [facility] since your last visit?  Include any pap smears or colon screening. No

## 2015-02-15 NOTE — Progress Notes (Signed)
[facility]  Subjective:      Chief Complaint   Patient presents with   ??? Hypertension     follow up   room 4   ??? Cholesterol Problem   Has a lady friend in [place], [place] and is spending a lot of time there lately, still lives in [place].  History of Present Illness:  Took bp med this morning, 2 of those.  146-7's/81-82.    Review of Systems: Negative for: fever, chest pain, shortness of breath, leg swelling, exertional dyspnea, palpitations.  Past Surgical History: He  has no past surgical history on file.  Family History: family history includes Diabetes in his paternal uncle; Hypertension in his brother, mother, and sister; Kidney Disease in his sister.  Social History: He  reports that he has been smoking Cigarettes.  He started smoking about 47 years ago. He has a 22 pack-year smoking history. He does not have any smokeless tobacco history on file. He reports that he drinks alcohol. He reports that he does not use illicit drugs. 1/2 PPD - used to be 1 PPD.  Objective:   BP 145/82 mmHg   Pulse 62   Temp(Src) 98.4 ??F (36.9 ??C) (Oral)   Ht 5' 8.5" (1.74 m)   Wt 173 lb (78.472 kg)   BMI 25.92 kg/m2  Lab Review: No results found for any visits on 02/15/15.  Physical Examination:    General appearance - well developed, no acute distress.   Chest - clear to auscultation.  Heart - regular rate and rhythm without murmurs, rubs, or gallops.  Abdomen - bowel sounds present x 4, NT, ND.  Extremities - pulses intact.  No peripheral edema.  Assessment/Plan:       ICD-10-CM ICD-9-CM    1. Essential hypertension with goal blood pressure less than 130/80 N23 557.3 METABOLIC PANEL, COMPREHENSIVE      lisinopril-hydrochlorothiazide (PRINZIDE, ZESTORETIC) 20-25 mg per tablet   2. Pure hypercholesterolemia E78.0 272.0 LIPID PANEL      lovastatin (MEVACOR) 40 mg tablet   3. Gastroesophageal reflux disease, esophagitis presence not specified K21.9 530.81      Current Outpatient Prescriptions    Medication Sig Dispense Refill   ??? lisinopril-hydrochlorothiazide (PRINZIDE, ZESTORETIC) 20-25 mg per tablet Take 1 Tab by mouth daily. 90 Tab 1   ??? lovastatin (MEVACOR) 40 mg tablet Take 1 Tab by mouth nightly. 90 Tab 1   ??? ranitidine (ACID CONTROL, RANITIDINE,) 150 mg tablet Take 1 Tab by mouth two (2) times daily as needed for Indigestion. 60 Tab 6   ??? aspirin delayed-release 81 mg tablet Take  by mouth daily.         Follow-up Disposition:  Return for 3-6 months, routine follow up; needs to schedule welcome to medicare visit.  Francisca December, MSN, RN, FNP-BC, BC-ADM  Alyson Locket Paris expressed understanding of this plan.

## 2015-02-16 LAB — LIPID PANEL
Cholesterol, total: 153 mg/dL (ref 100–199)
HDL Cholesterol: 36 mg/dL — ABNORMAL LOW (ref >39)
LDL, calculated: 100 mg/dL — ABNORMAL HIGH (ref 0–99)
Triglyceride: 84 mg/dL (ref 0–149)
VLDL, calculated: 17 mg/dL (ref 5–40)

## 2015-02-16 LAB — METABOLIC PANEL, COMPREHENSIVE
A-G Ratio: 1.4 (ref 1.1–2.5)
ALT (SGPT): 12 IU/L (ref 0–44)
AST (SGOT): 12 IU/L (ref 0–40)
Albumin: 4.2 g/dL (ref 3.6–4.8)
Alk. phosphatase: 93 IU/L (ref 39–117)
BUN/Creatinine ratio: 19 (ref 10–22)
BUN: 19 mg/dL (ref 8–27)
Bilirubin, total: 0.7 mg/dL (ref 0.0–1.2)
CO2: 22 mmol/L (ref 18–29)
Calcium: 9.8 mg/dL (ref 8.6–10.2)
Chloride: 99 mmol/L (ref 97–108)
Creatinine: 1.01 mg/dL (ref 0.76–1.27)
GFR est AA: 90 mL/min/{1.73_m2} (ref >59)
GFR est non-AA: 78 mL/min/{1.73_m2} (ref >59)
GLOBULIN, TOTAL: 3 g/dL (ref 1.5–4.5)
Glucose: 86 mg/dL (ref 65–99)
Potassium: 4.4 mmol/L (ref 3.5–5.2)
Protein, total: 7.2 g/dL (ref 6.0–8.5)
Sodium: 138 mmol/L (ref 134–144)

## 2015-02-24 NOTE — Progress Notes (Signed)
Quick Note:        You have slightly low "HDL" (good) cholesterol. We want this to be 40 or higher (for men) to provide heart and brain protection; yours is 36. Being more physically active is the only way to bring this up to healthier levels. Continue the cholesterol medication - your LDL (bad) cholesterol level is improved. [place].    ______

## 2015-03-07 ENCOUNTER — Ambulatory Visit: Admit: 2015-03-07 | Discharge: 2015-03-07 | Payer: MEDICARE | Attending: Family | Primary: Internal Medicine

## 2015-03-07 DIAGNOSIS — I10 Essential (primary) hypertension: Secondary | ICD-10-CM

## 2015-03-07 MED ORDER — AMLODIPINE 5 MG TAB
5 mg | ORAL_TABLET | Freq: Every day | ORAL | Status: DC
Start: 2015-03-07 — End: 2015-06-11

## 2015-03-07 NOTE — Progress Notes (Signed)
Subjective:   Damon [name] is a 64 y.o. male who presents for follow up of   Chief Complaint   Patient presents with   ??? Follow-up     Room 3      He also  has a past medical history of HTN (hypertension); GERD (gastroesophageal reflux disease); and Hyperlipidemia..  Blood pressures outside of clinic: 143-145 with bp.  1/2 ppd SMOKING.  Has quit for a few days.  After eating.  Habit.  Age 44 when started.  Last took medication: this morning Physical activity: minimal  Cardiovascular ROS: no chest pain on exertion, no dyspnea on exertion, no swelling of ankles.   Objective:     Filed Vitals:    03/07/15 1034   BP: 148/70   Pulse: 58   Temp: 98.6 ??F (37 ??C)   TempSrc: Oral   Weight: 174 lb (78.926 kg)     Wt Readings from Last 3 Encounters:   03/07/15 174 lb (78.926 kg)   02/15/15 173 lb (78.472 kg)   07/04/14 170 lb (77.111 kg)     Appearance: alert, well appearing, and in no distress.  General exam: CVS exam BP noted to be mildly elevated today in office, S1, S2 normal, no gallop, no murmur, chest clear, no JVD, no HSM, no edema, fundi - normal.  No results found for any visits on 03/07/15.  Assessment/Plan:   Hypertension in fair control. Rationale for adding amlodipine: Since he just bought his lisinopril/hctz 20mg /25mg , it would not make sense for me to write him a new prescription to take 2 pills of lisin/hctz 20mg /12.5mg .  In my experience, adding lisinopril 20mg  to lisinopril/hctz 20/25mg  leads to confusion at the pharmacy that there is an error in the prescription.  It made the most sense to add a small dose of amlodipine to his current lisinopril/hctz 20/25mg  and recheck in 4 weeks.  States he would probably be able to return in 6 weeks.  He has enough medicine to last 3 months.  I emphasized importance of returning no later than 6 weeks.  I advised he make the follow up appointment and the Medicare Wellness Visit on the same day, and to schedule the [place] [facility] with the nurse Cleophas Dunker.   He spends a lot of his time with his lady friend in [place], although he lives here in [place], and would prefer to make one trip for both purposes.  orders and follow up as documented in patient record  reviewed diet, exercise and weight control  recommended sodium restriction  very strongly urged to quit smoking to reduce cardiovascular risk.     ICD-10-CM ICD-9-CM    1. Pure hypercholesterolemia E78.0 272.0    2. Essential hypertension with goal blood pressure less than 130/80 I10 401.9    3. Gastroesophageal reflux disease, esophagitis presence not specified K21.9 530.81      Follow-up Disposition:  Return in about 4 weeks (around 04/04/2015) for blood pressure follow up; MEDICARE WELLNESS VISIT.  Amlodipine 5mg  was added for HTN.

## 2015-03-07 NOTE — Progress Notes (Signed)
Coordination of Care  1. Have you been to the ER, urgent care clinic since your last visit?  Hospitalized since your last visit? No    2. Have you seen or consulted any other health care providers outside of the [facility] since your last visit?  Include any pap smears or colon screening. No    Medications  Medication Reconciliation Performed: yes  Patient does not need refills     Learning Assessment Complete? yes

## 2015-04-17 NOTE — Progress Notes (Signed)
Adherence plan:    Reminded pt of welcome to medicare wellness visit tomorrow. Mancel Bale, RN

## 2015-04-18 ENCOUNTER — Ambulatory Visit: Admit: 2015-04-18 | Discharge: 2015-04-18 | Payer: MEDICARE | Attending: Family | Primary: Internal Medicine

## 2015-04-18 DIAGNOSIS — Z0000 Encounter for general adult medical examination without abnormal findings: Secondary | ICD-10-CM

## 2015-04-18 NOTE — Progress Notes (Signed)
Coordination of Care  1. Have you been to the ER, urgent care clinic since your last visit?  Hospitalized since your last visit? No    2. Have you seen or consulted any other health care providers outside of the [facility] since your last visit?  Include any pap smears or colon screening. No    Medications  Medication Reconciliation Performed: yes  Patient does not need refills     Learning Assessment Complete? yes

## 2015-04-18 NOTE — Progress Notes (Signed)
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     History     Past Medical History   Diagnosis Date   ??? HTN (hypertension)    ??? GERD (gastroesophageal reflux disease)    ??? Hyperlipidemia       History reviewed. No pertinent past surgical history.  Current Outpatient Prescriptions   Medication Sig Dispense Refill   ??? amLODIPine (NORVASC) 5 mg tablet Take 1 Tab by mouth daily. For blood pressure 90 Tab 0   ??? lisinopril-hydrochlorothiazide (PRINZIDE, ZESTORETIC) 20-25 mg per tablet Take 1 Tab by mouth daily. 90 Tab 1   ??? lovastatin (MEVACOR) 40 mg tablet Take 1 Tab by mouth nightly. 90 Tab 1   ??? ranitidine (ACID CONTROL, RANITIDINE,) 150 mg tablet Take 1 Tab by mouth two (2) times daily as needed for Indigestion. 60 Tab 6   ??? aspirin delayed-release 81 mg tablet Take  by mouth daily.       No Known Allergies  Family History   Problem Relation Age of Onset   ??? Hypertension Mother    ??? Hypertension Sister    ??? Kidney Disease Sister    ??? Hypertension Brother    ??? Heart Disease Brother    ??? Kidney Disease Sister      on dialysis   ??? Heart Disease Sister    ??? Hypertension Sister    ??? Diabetes Paternal Uncle      deceased, 48's, heart trouble   ??? Diabetes Father    ??? Heart Disease Father    ??? Hypertension Father      History   Substance Use Topics   ??? Smoking status: Current Every Day Smoker -- 0.50 packs/day for 44 years     Types: Cigarettes     Start date: 12/11/1967   ??? Smokeless tobacco: Not on file      Comment: 1/2 pack cig. a day   ??? Alcohol Use: 0.0 oz/week     0 Standard drinks or equivalent per week      Comment: occasional     Diet, Lifestyle: generally follows a low fat low cholesterol diet, generally follows a low sodium diet, does not rigorously follow a diabetic  diet, exercises sporadically, smoker 1 cup coffee a day, caffeine intake rare, alcohol intake smoking - 1/2 PPD    Exercise level: not active    Depression Risk Screen     PHQ 2 / 9, over the last two weeks 04/18/2015   Little interest or pleasure in doing things Not at all   Feeling down, depressed or hopeless Not at all   Total Score PHQ 2 0     Alcohol Risk Screen   On any occasion during the past 3 months, have you had more than 4 drinks containing alcohol?  No    Do you average more than 14 drinks per week?  No    Functional Ability and Level of Safety     Hearing Loss   No    Activities of Daily Living   Self-care     Fall Risk Screen     Fall Risk Assessment, last 12 mths 04/18/2015   Able to walk? Yes   Fall in past 12 months? No     Abuse Screen   Patient  is not abused    Review of Systems   Eyes: negative except for can't read as well as he used to    Physical Examination     No exam data present     No exam performed today, Medicare Wellness Visit.    EKG Screening: not indicated.     No care team member to display     End-of-life planning  Advanced Directive discussed and documented: YES    Assessment/Plan   Education and counseling provided:  End-of-Life planning (with patient's consent)  Pneumococcal Vaccine  Prostate cancer screening tests (PSA, covered annually)  Colorectal cancer screening tests  Screening for glaucoma    ICD-10-CM ICD-9-CM    1. Routine general medical examination at a health care facility Z00.00 V70.0    2. Screening for alcoholism Z13.89 V79.1    3. Special screening for malignant neoplasm of prostate Z12.5 V76.44 REFERRAL FOR COLONOSCOPY      PR PROSTATE CA SCREENING; DRE      CANCELED: PR PROSTATE CA SCREENING; DRE   4. Personal history of tobacco use, presenting hazards to health Z87.891 V15.82 DUPLEX AORTA/IVC/ILIAC/GRAFTS COMPLETE   5. Nicotine dependence, unspecified, uncomplicated Y10.175 102.5    6. Encounter for immunization Z23 V03.89 ADMIN PNEUMOCOCCAL VACCINE       PNEUMOCOCCAL POLYSACCHARIDE VACCINE, 23-VALENT, ADULT OR IMMUNOSUPPRESSED PT DOSE,   reviewed diet, exercise and weight control  very strongly urged to quit smoking to reduce cardiovascular risk  cardiovascular risk and specific lipid/LDL goals reviewed  use of aspirin to prevent MI and TIA's discussed.   He is interested in smoking cessation counseling appointments, the ones that his insurance will pay for.  He will return for DRE/discussion of prostate screening.

## 2015-04-18 NOTE — Progress Notes (Signed)
Damon [name] Offerdahl completed screening documentation. No contraindications for administering today's ordered vaccines.  Vaccine Immunization Statement(s) given and instructions for adverse reaction. No adverse reaction noted at time of discharge, explained possible s/e. Reviewed symptoms indicating need to be seen in ER.  Patient given PPV23 vaccine; denies fever.  Pt had no adverse reaction at time of d/c. Vaccine administration documented into [place] Immunization Information System.

## 2015-04-18 NOTE — Patient Instructions (Addendum)
Today's Date -  04/18/15  Medicare Part B Preventive Services Limitations Recommendation/Date completed if known Scheduled/ Next Due   Bone Mass Measurement  (age 65 & older, biennial) Requires diagnosis related to osteoporosis or estrogen deficiency. Biennial benefit unless patient has history of long-term glucocorticoid tx or baseline is needed because initial test was by other method Completed:N/A      Recommended every 2 years DUE:   Cardiovascular Screening Blood Tests (every 5 years)  Total cholesterol, HDL, Triglycerides Order as a panel if possible Completed:02/15/15      Recommended annually DUE:02/15/20 or per provider recommendation   Colorectal Cancer Screening  -Fecal occult blood test (annual)  -Flexible sigmoidoscopy (5y)  -Screening colonoscopy (10y)  -Barium Enema  Completed:Never done        Recommended every 10 years DUE:Now   Counseling to Prevent Tobacco Use (up to 8 sessions per year)  - Counseling greater than 3 and up to 10 minutes  - Counseling greater than 10 minutes Patients must be asymptomatic of tobacco-related conditions to receive as preventive service You report smoking 1/2 ppd.  This support is available for you. Talk with provider.    Diabetes Screening Tests (at least every 3 years, Medicare covers annually or at 74-month intervals for prediabetic patients)    Fasting blood sugar (FBS) or glucose tolerance test (GTT) Patient must be diagnosed with one of the following:  -Hypertension, Dyslipidemia, obesity, previous impaired FBS or GTT  ???Or any two of the following: overweight, FH of diabetes, age ?68, history of gestational diabetes, birth of baby weighing more than 9 pounds Completed:02/15/15 you had a glucose of 86 which was normal        Recommended annually DUE:02/15/16   Diabetes Self-Management Training (DSMT) (no USPSTF recommendation) Requires referral by treating physician for patient with diabetes or renal  disease. 10 hours of initial DSMT session of no less than 30 minutes each in a continuous 68-month period.  2 hours of follow-up DSMT in subsequent years. N/A    Glaucoma Screening (no USPSTF recommendation) Diabetes mellitus, family history, African American, age 49 or over, Hispanic American, age 24 or over Completed:  You report never done      Recommended annually DUE:Now and annually   Human Immunodeficiency Virus (HIV) Screening (annually for increased risk patients)  HIV-1 and HIV-2 by EIA, ELISA, rapid antibody test, or oral mucosa transudate Patient must be at increased risk for HIV infection per USPSTF guidelines or pregnant.  Tests covered annually for patients at increased risk.  Pregnant patients may receive up to 3 test during pregnancy.  Ask provider   Medical Nutrition Therapy (MNT) (for diabetes or renal disease not recommended schedule) Requires referral by treating physician for patient with diabetes or renal disease.  Can be provided in same year as diabetes self-management training (DSMT), and CMS recommends medical nutrition therapy take place after DSMT.  Up to 3 hours for initial year and 2 hours in subsequent years.     Prostate Cancer Screening (annually up to age 6)  - Digital rectal exam (DRE)  - Prostate specific antigen (PSA) Annually (age 41 or over), DRE not paid separately when covered E/M service is provided on same date Completed:  You report never done      Recommended annually DUE:Now   Seasonal Influenza Vaccination (annually)  Completed:       Recommended annually    DUE every Fall    Pneumococcal Vaccination (once after 59)  Completed:Not done  Due Now   Hepatitis B Vaccinations (if medium/high risk) Medium/high risk factors:  End-stage renal disease,  Hemophiliacs who received Factor VIII or IX concentrates, Clients of institutions for the mentally retarded, Persons who live in the same house as a HepB virus carrier, Homosexual men, Illicit injectable drug abusers.      Screening Mammography (biennial age 102-74)? Annually (age 35 or over) Completed: N/A      Recommended annually DUE:   Screening Pap Tests and Pelvic Examination (up to age 93 and after 30 if unknown history or abnormal study last 10 years) Every 51 months except high risk Completed:N/A        Recommended every 2 years DUE:   Ultrasound Screening for Abdominal Aortic Aneurysm (AAA) (once) Patient must be referred through IPPE and not have had a screening for abdominal aortic aneurysm before under Medicare.  Limited to patients who meet one of the following criteria:  - Men who are 30-25 years old and have smoked more than 100 cigarettes in their lifetime.  -Anyone with a FH of AAA  -Anyone recommended for screening by USPSTF Not covered by Medicare as preventive.     Discuss with provider. You are eligible for screening.    Thanks for coming in today. It was nice to spend some time with you. If you have any questions about your visit today, please call [phone] and ask for [facility].   Patient verbalized understanding of information presented. AVS and Medicare Part B Preventive Services Table printed and given to pt and reviewed. See table for findings under Recommendation and Scheduled. All of the patient's questions were answered.           Learning About Colonoscopy  What is a colonoscopy?     A colonoscopy is a test (also called a procedure) that lets a doctor look inside your large intestine. The doctor uses a thin, lighted tube called a colonoscope. The doctor uses it to look for small growths called polyps, colon or rectal cancer (colorectal cancer), or other problems like bleeding.  During the procedure, the doctor can take samples of tissue. The samples can then be checked for cancer or other conditions. The doctor can also take out polyps.  How is colonoscopy done?  This procedure is done in a doctor's office or a clinic or hospital. You  will get medicine to help you relax and not feel pain. Some people find that they do not remember having the test because of the medicine.  The doctor gently moves the colonoscope, or scope, through the colon. The scope is also a small video camera. It lets the doctor see the colon and take pictures.  A colonoscopy usually takes 30 to 45 minutes. It may take longer if the doctor has to remove polyps.  How do you prepare for the procedure?  You need to clean out your colon before the procedure so the doctor can see all of your colon. You may start the cleaning process a day or two before the test. This depends on which "colon prep" your doctor recommends.  To clean your colon, you stop eating solid foods and drink only clear liquids. You can have water, tea, coffee, clear juices, clear broths, flavored ice pops, and gelatin (such as Jell-O). Do not drink anything red or purple, such as grape juice or fruit punch. And do not eat red or purple foods, such as grape ice pops or cherry gelatin.  The day or night before the procedure, you  drink a large amount of a special liquid. This causes loose, frequent stools. You will go to the bathroom a lot. It is very important to drink all of the colon prep liquid. If you have problems drinking the liquid, call your doctor.  For many people, the prep is worse than the test. It may be uncomfortable, and you may feel hungry on the clear liquid diet. Some people do not go to work or do their usual activities on the day of the prep.  Arrange to have someone take you home after the test.  What can you expect after a colonoscopy?  The nurses will watch you for 1 to 2 hours until the medicines wear off. Then you can go home. You will need a ride. Your doctor will tell you when you can eat and do your usual activities.  Your doctor will talk to you about when you will need your next colonoscopy. The results of your test and your risk for colorectal cancer  will help your doctor decide how often you need to be checked.  Follow-up care is a key part of your treatment and safety. Be sure to make and go to all appointments, and call your doctor if you are having problems. It's also a good idea to know your test results and keep a list of the medicines you take.  Where can you learn more?  Go to [employer]  Enter [phone] in the search box to learn more about "Learning About Colonoscopy."  ?? 2006-2016 [employer], [employer]. Care instructions adapted under license by [employer] (which disclaims liability or warranty for this information). This care instruction is for use with your licensed healthcare professional. If you have questions about a medical condition or this instruction, always ask your healthcare professional. [place] any warranty or liability for your use of this information.  Content Version: 10.9.538570; Current as of: August 10, 2014          Advance Directives: Care Instructions  Your Care Instructions  An advance directive is a legal way to state your wishes at the end of your life. It tells your family and your doctor what to do if you can no longer say what you want.  There are two main types of advance directives. You can change them any time that your wishes change.  ?? A living will tells your family and your doctor your wishes about life support and other treatment.  ?? A medical power of attorney lets you name a person to make treatment decisions for you when you can't speak for yourself. This person is called a health care agent.  If you do not have an advance directive, decisions about your medical care may be made by a doctor or a judge who doesn't know you.  It may help to think of an advance directive as a gift to the people who care for you. If you have one, they won't have to make tough decisions by themselves.   Follow-up care is a key part of your treatment and safety. Be sure to make and go to all appointments, and call your doctor if you are having problems. It's also a good idea to know your test results and keep a list of the medicines you take.  How can you care for yourself at home?  ?? Discuss your wishes with your loved ones and your doctor. This way, there are no surprises.  ?? Many states have a unique form. Or you  might use a universal form that has been approved by many states. This kind of form can sometimes be completed and stored online. Your electronic copy will then be available wherever you have a connection to the Internet. In most cases, doctors will respect your wishes even if you have a form from a different state.  ?? You don't need a lawyer to do an advance directive. But you may want to get legal advice.  ?? Think about these questions when you prepare an advance directive:  ?? Who do you want to make decisions about your medical care if you are not able to? Many people choose a family member, close friend, or doctor.  ?? Do you know enough about life support methods that might be used? If not, talk to your doctor so you understand.  ?? What are you most afraid of that might happen? You might be afraid of having pain, losing your independence, or being kept alive by machines.  ?? Where would you prefer to die? Choices include your home, a hospital, or a nursing home.  ?? Would you like to have information about hospice care to support you and your family?  ?? Do you want to donate organs when you die?  ?? Do you want certain religious practices performed before you die? If so, put your wishes in the advance directive.  ?? Read your advance directive every year, and make changes as needed.  When should you call for help?  Be sure to contact your doctor if you have any questions.  Where can you learn more?  Go to [employer]   Enter R264 in the search box to learn more about "Advance Directives: Care Instructions."  ?? 2006-2016 Health[employer], [employer]. Care instructions adapted under license by [employer] (which disclaims liability or warranty for this information). This care instruction is for use with your licensed healthcare professional. If you have questions about a medical condition or this instruction, always ask your healthcare professional. [place] any warranty or liability for your use of this information.  Content Version: 10.9.538570; Current as of: November 14, 2014

## 2015-04-24 ENCOUNTER — Ambulatory Visit: Payer: MEDICARE | Primary: Internal Medicine

## 2015-05-13 NOTE — Addendum Note (Signed)
Addended by: Catha Nottingham T on: 05/13/2015 01:03 PM      Modules accepted: Level of Service

## 2015-05-15 ENCOUNTER — Telehealth

## 2015-05-15 ENCOUNTER — Inpatient Hospital Stay: Payer: MEDICARE | Attending: Family | Primary: Internal Medicine

## 2015-05-15 ENCOUNTER — Inpatient Hospital Stay: Admit: 2015-05-15 | Payer: MEDICARE | Attending: Family | Primary: Internal Medicine

## 2015-05-15 DIAGNOSIS — Z87891 Personal history of nicotine dependence: Secondary | ICD-10-CM

## 2015-05-15 DIAGNOSIS — I714 Abdominal aortic aneurysm, without rupture: Secondary | ICD-10-CM

## 2015-05-15 NOTE — Progress Notes (Signed)
Called and LM on VM asking pt to call the clinic for his test results.

## 2015-05-15 NOTE — Progress Notes (Signed)
Spoke to the patient, made an appt for him to see Dr Jones Bales, 8/31 at 8am, pt asked to arrive 30 mins early and bring his insurance card and a picture ID.  Gave pt the address - [address], [place], [address], [place]  Ph # [phone].  Explained what his diagnosis is and how important it is that he not smoke as this can cause the problem to get worse.  Explained that the aneurysm can leak or rupture and this can be life threatening.

## 2015-05-15 NOTE — Telephone Encounter (Signed)
[place] [facility] called and they said the initial ultrasound order was incorrect for the diagnosis for this patient and recommended the ultrasound screening AAA, patient was already present and ready for this test. Order placed.

## 2015-05-16 NOTE — Addendum Note (Signed)
Addended by: Marlane Mingle on: 05/16/2015 03:05 PM      Modules accepted: Level of Service

## 2015-05-18 NOTE — Progress Notes (Signed)
Please assist this patient in making a follow up appt with vascular surgery for guidance and management of his Abdominal Aortic Aneurysm.

## 2015-05-18 NOTE — Progress Notes (Signed)
Your screening for "Abdominal Aortic Aneurysm" (AAA) was positive.  Yours is 4.7 cm.  This needs to be followed by a vascular surgeon.  Dr. Sinclair Ship or Dr. Linzie Collin.  To prevent problems, you need to quit smoking.  Your blood pressure needs to be in the 130's or possibly lower to prevent further problems.

## 2015-05-21 NOTE — Telephone Encounter (Signed)
Dr Olevia Perches [facility]'s office called this morning, he does not treat Aneurysm's, cancelled the appointment the patient had for tomorrow.  They suggested trying Dr Jeneen Rinks, called his office at Ph# [phone] had to leave a VM on his personal assistants line.  Asked her to call the office for me to schedule an appointment for pt with an aneurysm- left the back line phone #.  Called the patient to let him know he should no keep tomorrow's appointment that I will call him when I get an appointment with a different surgeon.

## 2015-05-21 NOTE — Telephone Encounter (Signed)
Called Dr Kathrin Penner, [facility].  Made an appointment for the pt to be seen on Thursday 9/1 at 64 am.  Called the patient,  Gave him appointment date and time, asked that he arrive at 8.45 am to complete paperwork, he should take a picture ID and his insurance card.  Address [address] [place] # [phone].  Faxed last office visit and radiology report to Dr Kathrin Penner at [facility] [phone]

## 2015-05-22 ENCOUNTER — Encounter: Attending: Surgery | Primary: Internal Medicine

## 2015-05-23 ENCOUNTER — Encounter

## 2015-05-28 ENCOUNTER — Inpatient Hospital Stay: Admit: 2015-05-28 | Payer: MEDICARE | Attending: Surgery | Primary: Internal Medicine

## 2015-05-28 ENCOUNTER — Encounter

## 2015-05-28 DIAGNOSIS — I714 Abdominal aortic aneurysm, without rupture: Secondary | ICD-10-CM

## 2015-05-28 LAB — CREATININE, POC
Creatinine (POC): 1.2 MG/DL (ref 0.6–1.3)
GFRAA, POC: >60 mL/min/{1.73_m2} (ref >60)
GFRNA, POC: >60 mL/min/{1.73_m2} (ref >60)

## 2015-05-28 MED ORDER — IOPAMIDOL 76 % IV SOLN
370 mg iodine /mL (76 %) | Freq: Once | INTRAVENOUS | Status: AC
Start: 2015-05-28 — End: 2015-05-28
  Administered 2015-05-28: 12:00:00 via INTRAVENOUS

## 2015-05-28 MED ORDER — SODIUM CHLORIDE 0.9 % IV
Freq: Once | INTRAVENOUS | Status: AC
Start: 2015-05-28 — End: 2015-05-28
  Administered 2015-05-28: 12:00:00 via INTRAVENOUS

## 2015-05-28 MED ORDER — SODIUM CHLORIDE 0.9 % IJ SYRG
Freq: Once | INTRAMUSCULAR | Status: AC
Start: 2015-05-28 — End: 2015-05-28
  Administered 2015-05-28: 12:00:00 via INTRAVENOUS

## 2015-05-28 MED FILL — SODIUM CHLORIDE 0.9 % IV: INTRAVENOUS | Qty: 1000

## 2015-06-11 ENCOUNTER — Inpatient Hospital Stay: Admit: 2015-06-11 | Payer: MEDICARE | Primary: Internal Medicine

## 2015-06-11 ENCOUNTER — Inpatient Hospital Stay: Admit: 2015-06-11 | Payer: MEDICARE | Attending: Surgery | Primary: Internal Medicine

## 2015-06-11 DIAGNOSIS — I714 Abdominal aortic aneurysm, without rupture: Secondary | ICD-10-CM

## 2015-06-11 DIAGNOSIS — Z01810 Encounter for preprocedural cardiovascular examination: Secondary | ICD-10-CM

## 2015-06-11 LAB — TYPE AND SCREEN
ABO/Rh: O POS
Antibody Screen: NEGATIVE

## 2015-06-11 LAB — METABOLIC PANEL, COMPREHENSIVE
A-G Ratio: 1.1 (ref 1.1–2.2)
ALT (SGPT): 26 U/L (ref 12–78)
AST (SGOT): 13 U/L — ABNORMAL LOW (ref 15–37)
Albumin: 3.9 g/dL (ref 3.5–5.0)
Alk. phosphatase: 92 U/L (ref 45–117)
Anion gap: 8 mmol/L (ref 5–15)
BUN/Creatinine ratio: 16 (ref 12–20)
BUN: 22 MG/DL — ABNORMAL HIGH (ref 6–20)
Bilirubin, total: 0.6 MG/DL (ref 0.2–1.0)
CO2: 28 mmol/L (ref 21–32)
Calcium: 9.7 MG/DL (ref 8.5–10.1)
Chloride: 102 mmol/L (ref 97–108)
Creatinine: 1.4 MG/DL — ABNORMAL HIGH (ref 0.70–1.30)
GFR est AA: >60 mL/min/{1.73_m2} (ref >60)
GFR est non-AA: 51 mL/min/{1.73_m2} — ABNORMAL LOW (ref >60)
Globulin: 3.7 g/dL (ref 2.0–4.0)
Glucose: 151 mg/dL — ABNORMAL HIGH (ref 65–100)
Potassium: 4.2 mmol/L (ref 3.5–5.1)
Protein, total: 7.6 g/dL (ref 6.4–8.2)
Sodium: 138 mmol/L (ref 136–145)

## 2015-06-11 LAB — EKG, 12 LEAD, INITIAL
Atrial Rate: 58 {beats}/min
Calculated P Axis: -7 degrees
Calculated R Axis: 7 degrees
Calculated T Axis: 62 degrees
P-R Interval: 168 ms
Q-T Interval: 428 ms
QRS Duration: 84 ms
QTC Calculation (Bezet): 420 ms
Ventricular Rate: 58 {beats}/min

## 2015-06-11 LAB — TYPE & SCREEN
ABO/Rh(D): O POS
Antibody screen: NEGATIVE

## 2015-06-11 LAB — CBC WITH AUTOMATED DIFF
ABS. BASOPHILS: 0 10*3/uL (ref 0.0–0.1)
ABS. EOSINOPHILS: 0.4 10*3/uL (ref 0.0–0.4)
ABS. LYMPHOCYTES: 2 10*3/uL (ref 0.8–3.5)
ABS. MONOCYTES: 0.6 10*3/uL (ref 0.0–1.0)
ABS. NEUTROPHILS: 3.6 10*3/uL (ref 1.8–8.0)
BASOPHILS: 0 % (ref 0–1)
EOSINOPHILS: 7 % (ref 0–7)
HCT: 45.4 % (ref 36.6–50.3)
HGB: 15.5 g/dL (ref 12.1–17.0)
LYMPHOCYTES: 30 % (ref 12–49)
MCH: 28.8 PG (ref 26.0–34.0)
MCHC: 34.1 g/dL (ref 30.0–36.5)
MCV: 84.4 FL (ref 80.0–99.0)
MONOCYTES: 9 % (ref 5–13)
NEUTROPHILS: 54 % (ref 32–75)
PLATELET: 305 10*3/uL (ref 150–400)
RBC: 5.38 M/uL (ref 4.10–5.70)
RDW: 14.6 % — ABNORMAL HIGH (ref 11.5–14.5)
WBC: 6.7 10*3/uL (ref 4.1–11.1)

## 2015-06-11 LAB — PROTHROMBIN TIME + INR
INR: 1 (ref 0.9–1.1)
Prothrombin time: 10 s (ref 9.0–11.1)

## 2015-06-11 LAB — PTT: aPTT: 28.6 s (ref 22.1–32.5)

## 2015-06-11 MED ORDER — AMLODIPINE 5 MG TAB
5 mg | ORAL_TABLET | ORAL | 0 refills | Status: DC
Start: 2015-06-11 — End: 2015-09-04

## 2015-06-11 NOTE — Other (Signed)
PATIENT GIVEN 2 PACKS OF 6 [facility] WIPES  WITH INSTRUCTIONS BOTH WRITTEN AND VERBAL FOR THEIR USE. PATIENT VOICED UNDERSTANDING OF SAME AND HAD ALL QUESTIONS ADDRESSED AND ANSWERED TO THEIR SATISFACTION.   PATIENT ALSO GIVEN PRE-OP INSTRUCTIONS AND VOICED UNDERSTANDING OF SAME.

## 2015-06-18 ENCOUNTER — Inpatient Hospital Stay: Admit: 2015-06-18 | Payer: MEDICARE | Primary: Internal Medicine

## 2015-06-18 ENCOUNTER — Inpatient Hospital Stay
Admit: 2015-06-18 | Discharge: 2015-06-20 | Disposition: A | Discharge status: Home / Self Care | Payer: MEDICARE | Attending: Surgery | Admitting: Surgery

## 2015-06-18 DIAGNOSIS — I714 Abdominal aortic aneurysm, without rupture, unspecified: Secondary | ICD-10-CM | POA: Insufficient documentation

## 2015-06-18 HISTORY — DX: Abdominal aortic aneurysm, without rupture: I71.4

## 2015-06-18 HISTORY — DX: Abdominal aortic aneurysm, without rupture, unspecified: I71.40

## 2015-06-18 MED ORDER — FAMOTIDINE 20 MG TAB
20 mg | Freq: Two times a day (BID) | ORAL | Status: DC | PRN
Start: 2015-06-18 — End: 2015-06-20
  Administered 2015-06-18 – 2015-06-20 (×2): via ORAL

## 2015-06-18 MED ORDER — SODIUM CHLORIDE 0.9 % IJ SYRG
Freq: Three times a day (TID) | INTRAMUSCULAR | Status: DC
Start: 2015-06-18 — End: 2015-06-20
  Administered 2015-06-18 – 2015-06-20 (×6): via INTRAVENOUS

## 2015-06-18 MED ORDER — ACETAMINOPHEN 325 MG TABLET
325 mg | ORAL | Status: DC | PRN
Start: 2015-06-18 — End: 2015-06-20

## 2015-06-18 MED ORDER — NALOXONE 0.4 MG/ML INJECTION
0.4 mg/mL | INTRAMUSCULAR | Status: DC | PRN
Start: 2015-06-18 — End: 2015-06-20

## 2015-06-18 MED ORDER — DEXAMETHASONE SODIUM PHOSPHATE 4 MG/ML IJ SOLN
4 mg/mL | INTRAMUSCULAR | Status: DC | PRN
Start: 2015-06-18 — End: 2015-06-18
  Administered 2015-06-18: 13:00:00 via INTRAVENOUS

## 2015-06-18 MED ORDER — SODIUM CHLORIDE 0.9 % IV
INTRAVENOUS | Status: DC
Start: 2015-06-18 — End: 2015-06-18

## 2015-06-18 MED ORDER — 1/2 NS WITH POTASSIUM CHLORIDE 20 MEQ/L IV
20 mEq/L | INTRAVENOUS | Status: DC
Start: 2015-06-18 — End: 2015-06-20
  Administered 2015-06-18 – 2015-06-20 (×2): via INTRAVENOUS

## 2015-06-18 MED ORDER — ALBUTEROL SULFATE 0.083 % (0.83 MG/ML) SOLN FOR INHALATION
2.5 mg /3 mL (0.083 %) | RESPIRATORY_TRACT | Status: DC | PRN
Start: 2015-06-18 — End: 2015-06-18

## 2015-06-18 MED ORDER — CEFAZOLIN 2 GRAM/50 ML NS IVPB
Freq: Three times a day (TID) | INTRAVENOUS | Status: DC
Start: 2015-06-18 — End: 2015-06-19
  Administered 2015-06-18 – 2015-06-19 (×3): via INTRAVENOUS

## 2015-06-18 MED ORDER — PHENYLEPHRINE 10 MG/ML INJECTION
10 mg/mL | INTRAMUSCULAR | Status: DC | PRN
Start: 2015-06-18 — End: 2015-06-18
  Administered 2015-06-18: 12:00:00 via INTRAVENOUS

## 2015-06-18 MED ORDER — HEPARIN (PORCINE) 1,000 UNIT/ML IJ SOLN
1000 unit/mL | INTRAMUSCULAR | Status: DC | PRN
Start: 2015-06-18 — End: 2015-06-18
  Administered 2015-06-18: 12:00:00 via INTRAVENOUS

## 2015-06-18 MED ORDER — GLYCOPYRROLATE 0.2 MG/ML IJ SOLN
0.2 mg/mL | INTRAMUSCULAR | Status: DC | PRN
Start: 2015-06-18 — End: 2015-06-18
  Administered 2015-06-18: 13:00:00 via INTRAVENOUS

## 2015-06-18 MED ORDER — CEFAZOLIN 1 GRAM SOLUTION FOR INJECTION
1 gram | INTRAMUSCULAR | Status: DC | PRN
Start: 2015-06-18 — End: 2015-06-18
  Administered 2015-06-18: 12:00:00 via INTRAVENOUS

## 2015-06-18 MED ORDER — MORPHINE 10 MG/ML INJ SOLUTION
10 mg/ml | INTRAMUSCULAR | Status: DC | PRN
Start: 2015-06-18 — End: 2015-06-18

## 2015-06-18 MED ORDER — ROPIVACAINE (PF) 5 MG/ML (0.5 %) INJECTION
5 mg/mL (0. %) | INTRAMUSCULAR | Status: DC | PRN
Start: 2015-06-18 — End: 2015-06-18

## 2015-06-18 MED ORDER — LACTATED RINGERS IV
INTRAVENOUS | Status: DC
Start: 2015-06-18 — End: 2015-06-18

## 2015-06-18 MED ORDER — HYDROMORPHONE (PF) 1 MG/ML IJ SOLN
1 mg/mL | INTRAMUSCULAR | Status: DC | PRN
Start: 2015-06-18 — End: 2015-06-20
  Administered 2015-06-19 – 2015-06-20 (×2): via INTRAVENOUS

## 2015-06-18 MED ORDER — GLYCOPYRROLATE 0.2 MG/ML IJ SOLN
0.2 mg/mL | Freq: Once | INTRAMUSCULAR | Status: DC | PRN
Start: 2015-06-18 — End: 2015-06-18

## 2015-06-18 MED ORDER — SODIUM CHLORIDE 0.9 % IV
INTRAVENOUS | Status: DC | PRN
Start: 2015-06-18 — End: 2015-06-18
  Administered 2015-06-18: 13:00:00 via INTRAVENOUS

## 2015-06-18 MED ORDER — LIDOCAINE (PF) 20 MG/ML (2 %) IJ SOLN
20 mg/mL (2 %) | INTRAMUSCULAR | Status: DC | PRN
Start: 2015-06-18 — End: 2015-06-18
  Administered 2015-06-18: 12:00:00 via INTRAVENOUS

## 2015-06-18 MED ORDER — MIDAZOLAM 1 MG/ML IJ SOLN
1 mg/mL | INTRAMUSCULAR | Status: DC | PRN
Start: 2015-06-18 — End: 2015-06-18

## 2015-06-18 MED ORDER — SODIUM CHLORIDE 0.9 % IJ SYRG
INTRAMUSCULAR | Status: DC | PRN
Start: 2015-06-18 — End: 2015-06-20

## 2015-06-18 MED ORDER — ROCURONIUM 10 MG/ML IV
10 mg/mL | INTRAVENOUS | Status: DC | PRN
Start: 2015-06-18 — End: 2015-06-18
  Administered 2015-06-18 (×4): via INTRAVENOUS

## 2015-06-18 MED ORDER — HYDROMORPHONE (PF) 1 MG/ML IJ SOLN
1 mg/mL | INTRAMUSCULAR | Status: DC | PRN
Start: 2015-06-18 — End: 2015-06-18

## 2015-06-18 MED ORDER — HYDROMORPHONE (PF) 1 MG/ML IJ SOLN
1 mg/mL | INTRAMUSCULAR | Status: AC
Start: 2015-06-18 — End: ?

## 2015-06-18 MED ORDER — NEOSTIGMINE METHYLSULFATE 1 MG/ML INJECTION
1 mg/mL | INTRAMUSCULAR | Status: DC | PRN
Start: 2015-06-18 — End: 2015-06-18
  Administered 2015-06-18: 13:00:00 via INTRAVENOUS

## 2015-06-18 MED ORDER — ASPIRIN 81 MG TAB, DELAYED RELEASE
81 mg | Freq: Every day | ORAL | Status: DC
Start: 2015-06-18 — End: 2015-06-20
  Administered 2015-06-18 – 2015-06-20 (×3): via ORAL

## 2015-06-18 MED ORDER — HYDROCHLOROTHIAZIDE 25 MG TAB
25 mg | Freq: Every day | ORAL | Status: DC
Start: 2015-06-18 — End: 2015-06-20
  Administered 2015-06-18 – 2015-06-20 (×3): via ORAL

## 2015-06-18 MED ORDER — EPHEDRINE SULFATE 50 MG/ML IJ SOLN
50 mg/mL | INTRAMUSCULAR | Status: AC
Start: 2015-06-18 — End: ?

## 2015-06-18 MED ORDER — ONDANSETRON (PF) 4 MG/2 ML INJECTION
4 mg/2 mL | INTRAMUSCULAR | Status: DC | PRN
Start: 2015-06-18 — End: 2015-06-18
  Administered 2015-06-18: 13:00:00 via INTRAVENOUS

## 2015-06-18 MED ORDER — ENOXAPARIN 40 MG/0.4 ML SUB-Q SYRINGE
40 mg/0.4 mL | SUBCUTANEOUS | Status: DC
Start: 2015-06-18 — End: 2015-06-20
  Administered 2015-06-19 – 2015-06-20 (×2): via SUBCUTANEOUS

## 2015-06-18 MED ORDER — HYDROCODONE-ACETAMINOPHEN 7.5 MG-325 MG TAB
ORAL | Status: DC | PRN
Start: 2015-06-18 — End: 2015-06-20
  Administered 2015-06-20: 14:00:00 via ORAL

## 2015-06-18 MED ORDER — IOPAMIDOL 76 % IV SOLN
370 mg iodine /mL (76 %) | INTRAVENOUS | Status: DC | PRN
Start: 2015-06-18 — End: 2015-06-18
  Administered 2015-06-18: 13:00:00 via INTRAVENOUS

## 2015-06-18 MED ORDER — SODIUM CHLORIDE 0.9 % IV
1000 unit/mL | INTRAVENOUS | Status: DC | PRN
Start: 2015-06-18 — End: 2015-06-18
  Administered 2015-06-18: 13:00:00

## 2015-06-18 MED ORDER — LACTATED RINGERS IV
INTRAVENOUS | Status: DC | PRN
Start: 2015-06-18 — End: 2015-06-18
  Administered 2015-06-18: 12:00:00 via INTRAVENOUS

## 2015-06-18 MED ORDER — FENTANYL CITRATE (PF) 50 MCG/ML IJ SOLN
50 mcg/mL | INTRAMUSCULAR | Status: AC
Start: 2015-06-18 — End: ?

## 2015-06-18 MED ORDER — MEPERIDINE (PF) 25 MG/ML INJ SOLUTION
25 mg/ml | Freq: Once | INTRAMUSCULAR | Status: DC
Start: 2015-06-18 — End: 2015-06-18

## 2015-06-18 MED ORDER — EPHEDRINE SULFATE 50 MG/ML IJ SOLN
50 mg/mL | INTRAMUSCULAR | Status: DC | PRN
Start: 2015-06-18 — End: 2015-06-18

## 2015-06-18 MED ORDER — PROPOFOL 10 MG/ML IV EMUL
10 mg/mL | INTRAVENOUS | Status: DC | PRN
Start: 2015-06-18 — End: 2015-06-18
  Administered 2015-06-18 (×3): via INTRAVENOUS

## 2015-06-18 MED ORDER — LACTATED RINGERS IV
INTRAVENOUS | Status: DC
Start: 2015-06-18 — End: 2015-06-18
  Administered 2015-06-18: 11:00:00 via INTRAVENOUS

## 2015-06-18 MED ORDER — LIDOCAINE (PF) 10 MG/ML (1 %) IJ SOLN
10 mg/mL (1 %) | INTRAMUSCULAR | Status: DC | PRN
Start: 2015-06-18 — End: 2015-06-18

## 2015-06-18 MED ORDER — ONDANSETRON (PF) 4 MG/2 ML INJECTION
4 mg/2 mL | INTRAMUSCULAR | Status: DC | PRN
Start: 2015-06-18 — End: 2015-06-18

## 2015-06-18 MED ORDER — HYDROCODONE-ACETAMINOPHEN 5 MG-325 MG TAB
5-325 mg | ORAL | Status: DC | PRN
Start: 2015-06-18 — End: 2015-06-18

## 2015-06-18 MED ORDER — RANITIDINE 150 MG TAB
150 mg | Freq: Two times a day (BID) | ORAL | Status: DC | PRN
Start: 2015-06-18 — End: 2015-06-18

## 2015-06-18 MED ORDER — SUCCINYLCHOLINE CHLORIDE 20 MG/ML INJECTION
20 mg/mL | INTRAMUSCULAR | Status: DC | PRN
Start: 2015-06-18 — End: 2015-06-18
  Administered 2015-06-18: 12:00:00 via INTRAVENOUS

## 2015-06-18 MED ORDER — MIDAZOLAM 1 MG/ML IJ SOLN
1 mg/mL | INTRAMUSCULAR | Status: DC | PRN
Start: 2015-06-18 — End: 2015-06-18
  Administered 2015-06-18: 12:00:00 via INTRAVENOUS

## 2015-06-18 MED ORDER — DIPHENHYDRAMINE HCL 50 MG/ML IJ SOLN
50 mg/mL | INTRAMUSCULAR | Status: DC | PRN
Start: 2015-06-18 — End: 2015-06-18

## 2015-06-18 MED ORDER — FENTANYL CITRATE (PF) 50 MCG/ML IJ SOLN
50 mcg/mL | INTRAMUSCULAR | Status: DC | PRN
Start: 2015-06-18 — End: 2015-06-18

## 2015-06-18 MED ORDER — MIDAZOLAM 1 MG/ML IJ SOLN
1 mg/mL | INTRAMUSCULAR | Status: AC
Start: 2015-06-18 — End: ?

## 2015-06-18 MED ORDER — LOVASTATIN 20 MG TAB
20 mg | Freq: Every evening | ORAL | Status: DC
Start: 2015-06-18 — End: 2015-06-20
  Administered 2015-06-19 – 2015-06-20 (×2): via ORAL

## 2015-06-18 MED ORDER — AMLODIPINE 5 MG TAB
5 mg | Freq: Every day | ORAL | Status: DC
Start: 2015-06-18 — End: 2015-06-20
  Administered 2015-06-19 – 2015-06-20 (×2): via ORAL

## 2015-06-18 MED ORDER — PHENYLEPHRINE IN 0.9 % SODIUM CL (40 MCG/ML) IV SYRINGE
0.4 mg/10 mL (40 mcg/mL) | INTRAVENOUS | Status: DC | PRN
Start: 2015-06-18 — End: 2015-06-18
  Administered 2015-06-18 (×2): via INTRAVENOUS

## 2015-06-18 MED ORDER — EPHEDRINE SULFATE 50 MG/ML IJ SOLN
50 mg/mL | INTRAMUSCULAR | Status: DC | PRN
Start: 2015-06-18 — End: 2015-06-18
  Administered 2015-06-18: 12:00:00 via INTRAVENOUS

## 2015-06-18 MED ORDER — FENTANYL CITRATE (PF) 50 MCG/ML IJ SOLN
50 mcg/mL | INTRAMUSCULAR | Status: DC | PRN
Start: 2015-06-18 — End: 2015-06-18
  Administered 2015-06-18 (×3): via INTRAVENOUS

## 2015-06-18 MED ORDER — LISINOPRIL-HYDROCHLOROTHIAZIDE 20 MG-25 MG TAB
20-25 mg | Freq: Every day | ORAL | Status: DC
Start: 2015-06-18 — End: 2015-06-18

## 2015-06-18 MED FILL — XYLOCAINE-MPF 20 MG/ML (2 %) INJECTION SOLUTION: 20 mg/mL (2 %) | INTRAMUSCULAR | Qty: 5

## 2015-06-18 MED FILL — QUELICIN 20 MG/ML INJECTION SOLUTION: 20 mg/mL | INTRAMUSCULAR | Qty: 5

## 2015-06-18 MED FILL — MIDAZOLAM 1 MG/ML IJ SOLN: 1 mg/mL | INTRAMUSCULAR | Qty: 5

## 2015-06-18 MED FILL — ROCURONIUM 10 MG/ML IV: 10 mg/mL | INTRAVENOUS | Qty: 5

## 2015-06-18 MED FILL — DEXAMETHASONE SODIUM PHOSPHATE 4 MG/ML IJ SOLN: 4 mg/mL | INTRAMUSCULAR | Qty: 1

## 2015-06-18 MED FILL — GLYCOPYRROLATE 0.2 MG/ML IJ SOLN: 0.2 mg/mL | INTRAMUSCULAR | Qty: 0.7

## 2015-06-18 MED FILL — BD POSIFLUSH NORMAL SALINE 0.9 % INJECTION SYRINGE: INTRAMUSCULAR | Qty: 10

## 2015-06-18 MED FILL — FENTANYL CITRATE (PF) 50 MCG/ML IJ SOLN: 50 mcg/mL | INTRAMUSCULAR | Qty: 5

## 2015-06-18 MED FILL — LACTATED RINGERS IV: INTRAVENOUS | Qty: 1000

## 2015-06-18 MED FILL — PHENYLEPHRINE IN 0.9 % SODIUM CL (40 MCG/ML) IV SYRINGE: 0.4 mg/10 mL (40 mcg/mL) | INTRAVENOUS | Qty: 300

## 2015-06-18 MED FILL — NEOSTIGMINE METHYLSULFATE 1 MG/ML INJECTION: 1 mg/mL | INTRAMUSCULAR | Qty: 4

## 2015-06-18 MED FILL — HYDROMORPHONE (PF) 1 MG/ML IJ SOLN: 1 mg/mL | INTRAMUSCULAR | Qty: 1

## 2015-06-18 MED FILL — FAMOTIDINE 20 MG TAB: 20 mg | ORAL | Qty: 1

## 2015-06-18 MED FILL — VAZCULEP 10 MG/ML INJECTION SOLUTION: 10 mg/mL | INTRAMUSCULAR | Qty: 1

## 2015-06-18 MED FILL — ONDANSETRON (PF) 4 MG/2 ML INJECTION: 4 mg/2 mL | INTRAMUSCULAR | Qty: 2

## 2015-06-18 MED FILL — DIPRIVAN 10 MG/ML INTRAVENOUS EMULSION: 10 mg/mL | INTRAVENOUS | Qty: 37

## 2015-06-18 MED FILL — SODIUM CHLORIDE 0.9 % IV: INTRAVENOUS | Qty: 1000

## 2015-06-18 MED FILL — BAYER LOW DOSE ASPIRIN 81 MG TABLET,DELAYED RELEASE: 81 mg | ORAL | Qty: 1

## 2015-06-18 MED FILL — CEFAZOLIN 2 GRAM/50 ML NS IVPB: INTRAVENOUS | Qty: 50

## 2015-06-18 MED FILL — 1/2 NS WITH POTASSIUM CHLORIDE 20 MEQ/L IV: 20 mEq/L | INTRAVENOUS | Qty: 1000

## 2015-06-18 MED FILL — EPHEDRINE SULFATE 50 MG/ML IJ SOLN: 50 mg/mL | INTRAMUSCULAR | Qty: 10

## 2015-06-18 MED FILL — CEFAZOLIN 1 GRAM SOLUTION FOR INJECTION: 1 gram | INTRAMUSCULAR | Qty: 2000

## 2015-06-18 MED FILL — EPHEDRINE SULFATE 50 MG/ML IJ SOLN: 50 mg/mL | INTRAMUSCULAR | Qty: 1

## 2015-06-18 MED FILL — HYDROCHLOROTHIAZIDE 25 MG TAB: 25 mg | ORAL | Qty: 1

## 2015-06-18 NOTE — Op Note (Signed)
[place]   OP NOTE       Name:  Damon [name], Damon [name]   MR#:  [id number]   DOB:  03-Sep-1951   Account #:  [id number]   Date of Adm:  06/18/2015       PREOPERATIVE DIAGNOSES: A 5.2 cm abdominal aortic aneurysm.    POSTOPERATIVE DIAGNOSIS: A 5.2 cm abdominal aortic   aneurysm.        PROCEDURES PERFORMED:   1. Repair of abdominal aortic aneurysm with Endologix endograft with   proximal cuff.   2. Percutaneous approach for this procedure.   3. Bilateral common iliac artery angioplasty to allow delivery of the   device.    SURGEON: Antonieta Pert. Carole Binning., MD    ANESTHESIA: General.    ESTIMATED BLOOD LOSS: 100 mL.     Total amount of dye used was approximately 50 mL.    SPECIMENS REMOVED: None.    COMPLICATIONS: None.    INDICATIONS: A 64 year old gentleman referred by Catha Nottingham, his   primary care physician and his cardiologist for evaluation of a greater   than 5 cm aneurysm. The patient had very poor femoral pulses. It was   opted prior to the procedure to angioplasty his external iliac arteries,   which were quite small in his left common iliac. There were some   recoil, but we decided not to place a stent at that time. He is now for   aneurysm repair.    DESCRIPTION OF PROCEDURE: After informed consent, the patient   was placed on the operating room table. Foley catheter was inserted.   General anesthesia was prepped and draped as a sterile field.   Thorough time-out was done to identify the side, etc. Subsequently   initially sticks were made into bilateral femoral arteries, followed by a   guidewire and a short 6 sheath on the left. On the right, 2 ProGlide   Perclose devices were placed at 10 and 2 o'clock position, followed by   an 11 sheath. The patient was given 7500 units of heparin. The 11   sheath was exchanged for the sheath for the main body device which   was placed on the right or ipsi side and positioned at the aortic    bifurcation. It was quite tight there and a wire was placed. A 9 x 4   balloon was used to smooth this out. The same thing was done on the   other side, a J-wire was placed from the 6-French sheath. A 9 x 4   balloon was used to dilate a very calcified common iliac artery takeoff   followed by a long 7 sheath. The main body device contralateral limb   wire was then placed after a J-wire and a guiding catheter and then a   stiff wire was placed up from the right. The main body sheath was then   easily placed after snaring the contralateral limb wire. It was   then placed down on the aortic bifurcation. The tag was then removed   to allow for removal of the device. The tip for the sheath was then   pulled back in but it would not advance through the right-sided   aortic iliac limb and so for this reason the inner core was removed and   the dilator for the sheath was then placed into the aorta just above the   device. At this point, the dilator was removed and the sheath was then  placed. The cuff was then positioned. From the other side, a flush   catheter was placed and arteriograms were obtained. There was no   *paralax**. This allowed placement through the 28 x 80 body, a 34 x 80   proximal cuff was positioned dead level with the renals. At this point, a   9 x 4 was then used to redilate the left limb at the common iliac.   The soft balloon was then used to dilate the neck of the aneurysm, the   proximal aspect of the cuff, the overlap in the right limb. Completion   arteriogram showed no endoleak, good flow into the renals, good flow   into the left hypogastric. The right hypogastric could not be visualized.   Runoff arteriogram was also done into the femorals and showed no   significant stenosis on the right and a 50% stenosis in the left iliac,   which was not done at this time. The procedure was then completed.   The main sheath was removed, as the 2 Perclose were put down on    the right side. The wire was removed and there was good hemostasis   on the left. Through the long 7 sheath, a J-wire was placed, followed   by a ProGlide. This was then used to control hemostasis on the left   side with good hemostasis. At this point, Steri-Strips were applied. The   feet were pink and warm. Hemostasis was excellent. The wounds were   irrigated. The patient was extubated and taken to the recovery room in   satisfactory condition.        Alesia Morin., MD      FDS / CP   D:  06/18/2015   09:33   T:  06/18/2015   11:19   Job #:  [id number]

## 2015-06-18 NOTE — Anesthesia Procedure Notes (Signed)
Arterial Line Placement    Start time: 06/18/2015 7:14 AM  End time: 06/18/2015 7:23 AM  Performed by: Lorn Junes  Authorized by: Melody Comas R     Pre-Procedure  Indications:  Arterial pressure monitoring and blood sampling  Preanesthetic Checklist: risks and benefits discussed      Procedure:   Prep:  Chlorhexidine  Orientation:  Right  Location:  Radial artery  Catheter size:  20 G  Number of attempts:  1  Cont Cardiac Output Sensor: No      Assessment:   Post-procedure:  Line secured and sterile dressing applied  Patient Tolerance:  Patient tolerated the procedure well with no immediate complications

## 2015-06-18 NOTE — Op Note (Signed)
[place]   OP NOTE       Name:  Damon [name], Damon [name]   MR#:  [id number]   DOB:  July 05, 1951   Account #:  [id number]   Date of Adm:  06/18/2015       PREOPERATIVE DIAGNOSES: A 5.2 cm abdominal aortic aneurysm.    POSTOPERATIVE DIAGNOSIS: A 5.2 cm abdominal aortic   aneurysm.        PROCEDURES PERFORMED:   1. Repair of abdominal aortic aneurysm with Endologix endograft with   proximal cuff.   2. Percutaneous approach for this procedure.   3. Bilateral common iliac artery angioplasty to allow delivery of the   device.    SURGEON: Antonieta Pert. Carole Binning., MD    ANESTHESIA: General.    ESTIMATED BLOOD LOSS: 100 mL.     Total amount of dye used was approximately 50 mL.    SPECIMENS REMOVED: None.    COMPLICATIONS: None.    INDICATIONS: A 64 year old gentleman referred by Catha Nottingham, his   primary care physician and his cardiologist for evaluation of a greater   than 5 cm aneurysm. The patient had very poor femoral pulses. It was   opted prior to the procedure to angioplasty his external iliac arteries,   which were quite small in his left common iliac. There were some   recoil, but we decided not to place a stent at that time. He is now for   aneurysm repair.    DESCRIPTION OF PROCEDURE: After informed consent, the patient   was placed on the operating room table. Foley catheter was inserted.   General anesthesia was prepped and draped as a sterile field.   Thorough time-out was done to identify the side, etc. Subsequently   initially sticks were made into bilateral femoral arteries, followed by a   guidewire and a short 6 sheath on the left. On the right, 2 ProGlide   Perclose devices were placed at 10 and 2 o'clock position, followed by   an 11 sheath. The patient was given 7500 units of heparin. The 11   sheath was exchanged for the sheath for the main body device which   was placed on the right or ipsi side and positioned at the aortic   bifurcation. It was quite tight there and a wire was placed. A 9  x 4   balloon was used to smooth this out. The same thing was done on the   other side, a J-wire was placed from the 6-French sheath. A 9 x 4   balloon was used to dilate a very calcified common iliac artery takeoff   followed by a long 7 sheath. The main body device contralateral limb   wire was then placed after a J-wire and a guiding catheter and then a   stiff wire was placed up from the right. The main body sheath was then   easily placed after snaring the contralateral limb wire. It was   then placed down on the aortic bifurcation. The tag was then removed   to allow for removal of the device. The tip for the sheath was then   pulled back in but it would not advance through the right-sided   aortic iliac limb and so for this reason the inner core was removed and   the dilator for the sheath was then placed into the aorta just above the   device. At this point, the dilator was removed and the sheath was then  placed. The cuff was then positioned. From the other side, a flush   catheter was placed and arteriograms were obtained. There was no   *paralax**. This allowed placement through the 28 x 80 body, a 34 x 80   proximal cuff was positioned dead level with the renals. At this point, a   9 x 4 was then used to redilate the left limb at the common iliac.   The soft balloon was then used to dilate the neck of the aneurysm, the   proximal aspect of the cuff, the overlap in the right limb. Completion   arteriogram showed no endoleak, good flow into the renals, good flow   into the left hypogastric. The right hypogastric could not be visualized.   Runoff arteriogram was also done into the femorals and showed no   significant stenosis on the right and a 50% stenosis in the left iliac,   which was not done at this time. The procedure was then completed.   The main sheath was removed, as the 2 Perclose were put down on   the right side. The wire was removed and there was good hemostasis   on the left. Through the  long 7 sheath, a J-wire was placed, followed   by a ProGlide. This was then used to control hemostasis on the left   side with good hemostasis. At this point, Steri-Strips were applied. The   feet were pink and warm. Hemostasis was excellent. The wounds were   irrigated. The patient was extubated and taken to the recovery room in   satisfactory condition.        Alesia Morin., MD      FDS / CP   D:  06/18/2015   09:33   T:  06/18/2015   11:19   Job #:  [id number]

## 2015-06-18 NOTE — Other (Signed)
SBAR report from CRNA

## 2015-06-18 NOTE — Other (Signed)
70 - Family called to notify of start of surgery and update.

## 2015-06-18 NOTE — Other (Signed)
TRANSFER - OUT REPORT:    Verbal report given to Barnett Applebaum (name) on Damon [name]  being transferred to [facility] (unit) for routine post - op       Report consisted of patient???s Situation, Background, Assessment and   Recommendations(SBAR).     Time Pre op antibiotic given:0750   Anesthesia Stop time: 0932     Information from the following report(s) SBAR, Kardex, OR Summary, Intake/Output, MAR, Med Rec Status and Cardiac Rhythm SR was reviewed with the receiving nurse.    Opportunity for questions and clarification was provided.     Is the patient on 02? YES       L/Min 2L         Is the patient on a monitor? YES    Is the nurse transporting with the patient? YES    Surgical Waiting Area notified of patient's transfer from PACU? YES      The following personal items collected during your admission accompanied patient upon transfer:   Dental Appliance: Dental Appliances: None  Clothing: Clothing:  (bag of clothes returned to pt)

## 2015-06-18 NOTE — Other (Signed)
IDR/SLIDR Summary          Patient: Damon [name] MRN: [id number]    Age: 64 y.o.     Birthdate: 11-26-1950 Room/Bed: 444/01   Admit Diagnosis: AAA  Abdominal aortic aneurysm without rupture (HCC)  Principal Diagnosis: <principal problem not specified>     Goals: pain control, rest, ambulation POD 1, monitor labs   Readmission: NO  Quality Measure: SCIP  VTE Prophylaxis: Mechanical and Chemical  Influenza Vaccine screening completed? NO  Pneumococcal Vaccine screening completed? YES  Mobility needs: Yes   Nutrition plan:No  Consults:   Financial concerns:No  Escalated to CM? NO  RRAT Score: 12   Interventions:H2H  Testing due for pt today? NO  LOS: 0 days Expected length of stay 1 days  Discharge plan: Home   PCP: Francisca December, NP  Transportation needs: No    Days before discharge:one day until discharge   Discharge disposition: Home    Interdisciplinary team rounds were held 06/19/2015 with the following team members:Care Management, Nursing and Clinical Coordinator and the patient.    Plan of care discussed. See clinical pathway and/or care plan for interventions and desired outcomes.  Signed:     Lavell Anchors, RN  06/18/2015  9:24 PM

## 2015-06-18 NOTE — Anesthesia Pre-Procedure Evaluation (Signed)
Anesthetic History   No history of anesthetic complications            Review of Systems / Medical History  Patient summary reviewed, nursing notes reviewed and pertinent labs reviewed    Pulmonary                   Neuro/Psych   Within defined limits           Cardiovascular    Hypertension              Exercise tolerance: <4 METS     GI/Hepatic/Renal     GERD: well controlled           Endo/Other  Within defined limits           Other Findings              Physical Exam    Airway  Mallampati: II  TM Distance: > 6 cm  Neck ROM: normal range of motion   Mouth opening: Normal     Cardiovascular  Regular rate and rhythm,  S1 and S2 normal,  no murmur, click, rub, or gallop             Dental  No notable dental hx       Pulmonary  Breath sounds clear to auscultation               Abdominal  GI exam deferred       Other Findings            Anesthetic Plan    ASA: 3  Anesthesia type: general    Monitoring Plan: Arterial line and CVP      Induction: Intravenous  Anesthetic plan and risks discussed with: Patient

## 2015-06-18 NOTE — Anesthesia Post-Procedure Evaluation (Signed)
Post-Anesthesia Evaluation and Assessment    Patient: Damon [name] MRN: [id number]  SSN: [id number]    Date of Birth: 1951/07/24  Age: 64 y.o.  Sex: male       Cardiovascular Function/Vital Signs  Visit Vitals   ??? BP 126/73   ??? Pulse 60   ??? Temp 36.4 ??C (97.6 ??F)   ??? Resp 18   ??? Ht 5\' 10"  (1.778 m)   ??? Wt 79.8 kg (176 lb)   ??? SpO2 99%   ??? BMI 25.25 kg/m2       Patient is status post general anesthesia for Procedure(s):  PERCUTANEOUS AORTIC ABDOMINAL ANEURYSM REPAIR ENDOVASCULAR (EVAR).    Nausea/Vomiting: None    Postoperative hydration reviewed and adequate.    Pain:  Pain Scale 1: Numeric (0 - 10) (06/18/15 1025)  Pain Intensity 1: 0 (06/18/15 1025)   Managed    Neurological Status:   Neuro (WDL): Within Defined Limits (06/18/15 1025)  Neuro  Neurologic State: Appropriate for age;Eyes open spontaneously;Eyes open to voice (06/18/15 1025)  LUE Motor Response: Purposeful (06/18/15 1025)  LLE Motor Response: Purposeful (06/18/15 1025)  RUE Motor Response: Purposeful (06/18/15 1025)  RLE Motor Response: Purposeful (06/18/15 1025)   At baseline    Mental Status and Level of Consciousness: Arousable    Pulmonary Status:   O2 Device: Nasal cannula (06/18/15 1025)   Adequate oxygenation and airway patent    Complications related to anesthesia: None    Post-anesthesia assessment completed. No concerns    Signed By: Beryle Quant, MD     June 18, 2015

## 2015-06-18 NOTE — Undefined (Signed)
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 a day   Alcohol Use Yes; 0.0 oz alcohol/week; 0 Standard drinks or equivalent per week; (occasional)   Drug Use No   Sexually Active Not Asked            The Beta blocker the patient is taking is @BETABLOCKERTAKING @, NONE          STAND:  NEEDS DYSPHAGIA SCREEN IF INDICATED  Voice quality/secretions:    Hx of dysphagia:    O2 sat:    Alertness:      Flu vaccination received for current season: NEED TO SCREEN FOR FLU     Anticoagulant Therapy: No-YES    VTE Documentation-CHEMICAL  VTE Risk Assessment VTE Risk Assessment: Age >40, Surgery within month  Mechanical VTE orders:    Venous Foot Pump:    Graduated Compression Stockings: Graduated Compression Stockings: Bilateral  Sequential Compression Devices:    Patient Refused VTE:        Diet: DIET REGULAR              Assessment:    Lines/Drains/Airways:   Peripheral IV 06/18/15 Left Hand (Active)   Site Assessment Clean, dry, & intact 06/18/2015  9:40 AM   Phlebitis Assessment 0 06/18/2015  9:40 AM   Infiltration Assessment 0 06/18/2015  9:40 AM   Dressing Status Clean, dry, & intact 06/18/2015  9:40 AM    Dressing Type Tape;Transparent 06/18/2015  9:40 AM   Hub Color/Line Status Green;Patent;Infusing 06/18/2015  9:40 AM   Action Taken Open ports on tubing capped 06/18/2015  9:40 AM   Alcohol Cap Used Yes 06/18/2015  9:40 AM       Peripheral IV 06/18/15 Right Hand (Active)   Site Assessment Clean, dry, & intact 06/18/2015  9:40 AM   Phlebitis Assessment 0 06/18/2015  9:40 AM   Infiltration Assessment 0 06/18/2015  9:40 AM   Dressing Status Clean, dry, & intact 06/18/2015  9:40 AM   Dressing Type Tape;Transparent 06/18/2015  9:40 AM   Hub Color/Line Status Pink;Patent;Flushed 06/18/2015  9:40 AM   Action Taken Open ports on tubing capped 06/18/2015  9:40 AM   Alcohol Cap Used Yes 06/18/2015  9:40 AM       Wound Groin Left (Active)   DRESSING STATUS Clean, dry, and intact 06/18/2015 10:25 AM   DRESSING TYPE 4 x 4;Adhesive wound closure strips (Steri-Strips);Special tape (comment) 06/18/2015 10:25 AM   Drainage Amount  None 06/18/2015 10:25 AM   Wound Odor None  06/18/2015 10:25 AM   Periwound Skin Condition Intact 06/18/2015 10:25 AM       Wound Groin Right (Active)   DRESSING STATUS Clean, dry, and intact 06/18/2015 10:25 AM   DRESSING TYPE 4 x 4;Adhesive wound closure strips (Steri-Strips);Special tape (comment) 06/18/2015 10:25 AM   Drainage Amount  None 06/18/2015 10:25 AM   Wound Odor None 06/18/2015 10:25 AM   Periwound Skin Condition Intact 06/18/2015 10:25 AM       Urinary Catheter 06/18/15 2- way (Active)   Criteria for Appropriate Use Surgical procedure 06/18/2015  9:40 AM   Status Draining;Patent 06/18/2015  9:40 AM   Site Condition No abnormalities 06/18/2015  9:40 AM   Drainage Tube Clipped to Bed Yes 06/18/2015  9:40 AM   Catheter Secured to Thigh Yes 06/18/2015  9:40 AM   Tamper Seal Intact Yes 06/18/2015  9:40 AM   Bag Below Bladder/Not on Floor Yes 06/18/2015  9:40 AM   Lack of Dependent Loop in Tubing Yes 06/18/2015  9:40 AM   Drainage Bag Less Than Half Full Yes 06/18/2015  9:40 AM    Sterile Solution Used for  Irrigation N/A 06/18/2015  9:40 AM   Urine Output (mL) 200 ml 06/18/2015 10:25 AM       Last 3 Weights:  Last 3 Recorded Weights in this Encounter    06/18/15 0706   Weight: 79.8 kg (176 lb)     Recommendations: vascular pathway  Consult Orders:   Recent orders:  FOLEY CATHETER, INSERTION/CARE  CARDIAC MONITORING  NOTIFY ANESTHESIA  APPLY GRADED COMPRESSION STOCKINGS  NURSING-MISCELLANEOUS:  D/C ARTERIAL LINE  XR FLUOROSCOPY OVER 60 MINUTES  INITIAL PHYSICIAN ORDER: INPATIENT  DIET REGULAR  VITAL SIGNS PER UNIT ROUTINE  ELEVATE HEAD OF BED  NOTIFY PROVIDER: VITAL SIGNS CHANGES  COUGH, DEEP BREATHE  FOLEY CATHETER, INSERTION/CARE  WOUND CARE, DRESSING CHANGE  SALINE LOCK IV    Core Measures Compliant   CHF:na   Pneumonia:na   Vaccines:screen for flu   SCIP:yes   Foley:na   AMI:na

## 2015-06-18 NOTE — Other (Signed)
Patient: Damon [name] MRN: [id number]  SSN: [id number]   Date of Birth: [birth year]  Age: 64 y.o.  Sex: male     Patient is status post Procedure(s):  PERCUTANEOUS AORTIC ABDOMINAL ANEURYSM REPAIR ENDOVASCULAR (EVAR).    Surgeon(s) and Role:     * Alesia Morin., MD - Primary    Local/Dose/Irrigation:  n/a                  Peripheral IV 06/18/15 Left Hand (Active)   Site Assessment Clean, dry, & intact 06/18/2015  7:00 AM   Phlebitis Assessment 0 06/18/2015  7:00 AM   Infiltration Assessment 0 06/18/2015  7:00 AM   Dressing Status Clean, dry, & intact 06/18/2015  7:00 AM       Peripheral IV 06/18/15 Right Hand (Active)   Site Assessment Clean, dry, & intact 06/18/2015  7:18 AM   Phlebitis Assessment 0 06/18/2015  7:18 AM   Infiltration Assessment 0 06/18/2015  7:18 AM   Dressing Status Clean, dry, & intact 06/18/2015  7:18 AM      Arterial Line 06/18/15 Right Radial artery (Active)          Airway - Endotracheal Tube 06/18/15 Oral (Active)                   Dressing/Packing:  Wound Groin Left-DRESSING TYPE: 4 x 4;Adhesive wound closure strips (Steri-Strips);Special tape (comment) (06/18/15 0913)  Wound Groin Right-DRESSING TYPE: 4 x 4;Adhesive wound closure strips (Steri-Strips);Special tape (comment) (06/18/15 0913)  Splint/Cast:  ]    Other:  Foley Catheter

## 2015-06-19 LAB — CBC W/O DIFF
HCT: 38.3 % (ref 36.6–50.3)
HGB: 13 g/dL (ref 12.1–17.0)
MCH: 28.1 PG (ref 26.0–34.0)
MCHC: 33.9 g/dL (ref 30.0–36.5)
MCV: 82.9 FL (ref 80.0–99.0)
PLATELET: 269 10*3/uL (ref 150–400)
RBC: 4.62 M/uL (ref 4.10–5.70)
RDW: 14.1 % (ref 11.5–14.5)
WBC: 10.5 10*3/uL (ref 4.1–11.1)

## 2015-06-19 LAB — METABOLIC PANEL, BASIC
Anion gap: 10 mmol/L (ref 5–15)
BUN/Creatinine ratio: 14 (ref 12–20)
BUN: 15 MG/DL (ref 6–20)
CO2: 23 mmol/L (ref 21–32)
Calcium: 8.7 MG/DL (ref 8.5–10.1)
Chloride: 103 mmol/L (ref 97–108)
Creatinine: 1.08 MG/DL (ref 0.70–1.30)
GFR est AA: >60 mL/min/{1.73_m2} (ref >60)
GFR est non-AA: >60 mL/min/{1.73_m2} (ref >60)
Glucose: 113 mg/dL — ABNORMAL HIGH (ref 65–100)
Potassium: 4.2 mmol/L (ref 3.5–5.1)
Sodium: 136 mmol/L (ref 136–145)

## 2015-06-19 MED ORDER — SODIUM CHLORIDE 0.9 % IV
10 mg/mL | INTRAVENOUS | Status: DC | PRN
Start: 2015-06-19 — End: 2015-06-19
  Administered 2015-06-19 (×2): via INTRAVENOUS

## 2015-06-19 MED ORDER — ONDANSETRON (PF) 4 MG/2 ML INJECTION
4 mg/2 mL | INTRAMUSCULAR | Status: DC | PRN
Start: 2015-06-19 — End: 2015-06-19

## 2015-06-19 MED ORDER — FENTANYL CITRATE (PF) 50 MCG/ML IJ SOLN
50 mcg/mL | INTRAMUSCULAR | Status: DC | PRN
Start: 2015-06-19 — End: 2015-06-19

## 2015-06-19 MED ORDER — GLYCOPYRROLATE 0.2 MG/ML IJ SOLN
0.2 mg/mL | INTRAMUSCULAR | Status: DC | PRN
Start: 2015-06-19 — End: 2015-06-19
  Administered 2015-06-19 (×2): via INTRAVENOUS

## 2015-06-19 MED ORDER — SODIUM BICARBONATE 8.4 % IV
1 mEq/mL (8.4 %) | INTRAVENOUS | Status: AC
Start: 2015-06-19 — End: ?

## 2015-06-19 MED ORDER — NEOSTIGMINE METHYLSULFATE 1 MG/ML INJECTION
1 mg/mL | INTRAMUSCULAR | Status: DC | PRN
Start: 2015-06-19 — End: 2015-06-19
  Administered 2015-06-19: 19:00:00 via INTRAVENOUS

## 2015-06-19 MED ORDER — EPHEDRINE SULFATE 50 MG/ML IJ SOLN
50 mg/mL | INTRAMUSCULAR | Status: DC | PRN
Start: 2015-06-19 — End: 2015-06-19

## 2015-06-19 MED ORDER — SODIUM CHLORIDE 0.9 % IJ SYRG
Freq: Three times a day (TID) | INTRAMUSCULAR | Status: DC
Start: 2015-06-19 — End: 2015-06-19
  Administered 2015-06-19: 18:00:00 via INTRAVENOUS

## 2015-06-19 MED ORDER — PHENYLEPHRINE IN 0.9 % SODIUM CL (40 MCG/ML) IV SYRINGE
0.4 mg/10 mL (40 mcg/mL) | INTRAVENOUS | Status: DC | PRN
Start: 2015-06-19 — End: 2015-06-19
  Administered 2015-06-19: 18:00:00 via INTRAVENOUS

## 2015-06-19 MED ORDER — SUCCINYLCHOLINE CHLORIDE 20 MG/ML INJECTION
20 mg/mL | INTRAMUSCULAR | Status: DC | PRN
Start: 2015-06-19 — End: 2015-06-19
  Administered 2015-06-19: 18:00:00 via INTRAVENOUS

## 2015-06-19 MED ORDER — SODIUM CHLORIDE 0.9 % IJ SYRG
INTRAMUSCULAR | Status: DC | PRN
Start: 2015-06-19 — End: 2015-06-19

## 2015-06-19 MED ORDER — MIDAZOLAM 1 MG/ML IJ SOLN
1 mg/mL | INTRAMUSCULAR | Status: DC | PRN
Start: 2015-06-19 — End: 2015-06-19

## 2015-06-19 MED ORDER — ACETAMINOPHEN 325 MG TABLET
325 mg | Freq: Once | ORAL | Status: DC
Start: 2015-06-19 — End: 2015-06-19

## 2015-06-19 MED ORDER — HYDROMORPHONE (PF) 1 MG/ML IJ SOLN
1 mg/mL | INTRAMUSCULAR | Status: DC | PRN
Start: 2015-06-19 — End: 2015-06-19

## 2015-06-19 MED ORDER — DEXAMETHASONE SODIUM PHOSPHATE 4 MG/ML IJ SOLN
4 mg/mL | INTRAMUSCULAR | Status: DC | PRN
Start: 2015-06-19 — End: 2015-06-19
  Administered 2015-06-19: 19:00:00 via INTRAVENOUS

## 2015-06-19 MED ORDER — FENTANYL CITRATE (PF) 50 MCG/ML IJ SOLN
50 mcg/mL | INTRAMUSCULAR | Status: DC | PRN
Start: 2015-06-19 — End: 2015-06-19
  Administered 2015-06-19: 18:00:00 via INTRAVENOUS

## 2015-06-19 MED ORDER — LIDOCAINE (PF) 10 MG/ML (1 %) IJ SOLN
10 mg/mL (1 %) | INTRAMUSCULAR | Status: DC | PRN
Start: 2015-06-19 — End: 2015-06-19

## 2015-06-19 MED ORDER — ONDANSETRON (PF) 4 MG/2 ML INJECTION
4 mg/2 mL | INTRAMUSCULAR | Status: DC | PRN
Start: 2015-06-19 — End: 2015-06-19
  Administered 2015-06-19: 19:00:00 via INTRAVENOUS

## 2015-06-19 MED ORDER — SODIUM CHLORIDE 0.9 % IV
1000 unit/mL | INTRAVENOUS | Status: DC | PRN
Start: 2015-06-19 — End: 2015-06-19
  Administered 2015-06-19: 19:00:00

## 2015-06-19 MED ORDER — LACTATED RINGERS IV
INTRAVENOUS | Status: DC
Start: 2015-06-19 — End: 2015-06-19
  Administered 2015-06-19: 20:00:00 via INTRAVENOUS

## 2015-06-19 MED ORDER — 1/2 NS WITH POTASSIUM CHLORIDE 20 MEQ/L IV
20 mEq/L | INTRAVENOUS | Status: AC
Start: 2015-06-19 — End: 2015-06-19
  Administered 2015-06-19: 20:00:00 via INTRAVENOUS

## 2015-06-19 MED ORDER — LACTATED RINGERS IV
INTRAVENOUS | Status: DC
Start: 2015-06-19 — End: 2015-06-19
  Administered 2015-06-19: 18:00:00 via INTRAVENOUS

## 2015-06-19 MED ORDER — DIPHENHYDRAMINE HCL 50 MG/ML IJ SOLN
50 mg/mL | INTRAMUSCULAR | Status: DC | PRN
Start: 2015-06-19 — End: 2015-06-19

## 2015-06-19 MED ORDER — HEPARIN (PORCINE) 1,000 UNIT/ML IJ SOLN
1000 unit/mL | INTRAMUSCULAR | Status: DC | PRN
Start: 2015-06-19 — End: 2015-06-19
  Administered 2015-06-19: 19:00:00 via INTRAVENOUS

## 2015-06-19 MED ORDER — DEXTROSE 5%-LACTATED RINGERS IV
INTRAVENOUS | Status: DC
Start: 2015-06-19 — End: 2015-06-19
  Administered 2015-06-19: 18:00:00 via INTRAVENOUS

## 2015-06-19 MED ORDER — MEPERIDINE (PF) 25 MG/ML INJ SOLUTION
25 mg/ml | Freq: Once | INTRAMUSCULAR | Status: DC
Start: 2015-06-19 — End: 2015-06-19
  Administered 2015-06-19: 20:00:00 via INTRAVENOUS

## 2015-06-19 MED ORDER — PHENYLEPHRINE 10 MG/ML INJECTION
10 mg/mL | INTRAMUSCULAR | Status: DC | PRN
Start: 2015-06-19 — End: 2015-06-19
  Administered 2015-06-19 (×6): via INTRAVENOUS

## 2015-06-19 MED ORDER — PROPOFOL 10 MG/ML IV EMUL
10 mg/mL | INTRAVENOUS | Status: DC | PRN
Start: 2015-06-19 — End: 2015-06-19
  Administered 2015-06-19: 18:00:00 via INTRAVENOUS

## 2015-06-19 MED ORDER — MIDAZOLAM 1 MG/ML IJ SOLN
1 mg/mL | INTRAMUSCULAR | Status: DC | PRN
Start: 2015-06-19 — End: 2015-06-19
  Administered 2015-06-19: 18:00:00 via INTRAVENOUS

## 2015-06-19 MED ORDER — MIDAZOLAM 1 MG/ML IJ SOLN
1 mg/mL | INTRAMUSCULAR | Status: AC
Start: 2015-06-19 — End: ?

## 2015-06-19 MED ORDER — IOPAMIDOL 76 % IV SOLN
370 mg iodine /mL (76 %) | INTRAVENOUS | Status: AC
Start: 2015-06-19 — End: ?

## 2015-06-19 MED ORDER — ROCURONIUM 10 MG/ML IV
10 mg/mL | INTRAVENOUS | Status: DC | PRN
Start: 2015-06-19 — End: 2015-06-19
  Administered 2015-06-19 (×2): via INTRAVENOUS

## 2015-06-19 MED ORDER — CEFAZOLIN 2 GRAM/50 ML NS IVPB
INTRAVENOUS | Status: AC
Start: 2015-06-19 — End: 2015-06-19
  Administered 2015-06-19: 18:00:00 via INTRAVENOUS

## 2015-06-19 MED ORDER — LIDOCAINE HCL 1 % (10 MG/ML) IJ SOLN
10 mg/mL (1 %) | INTRAMUSCULAR | Status: AC
Start: 2015-06-19 — End: ?

## 2015-06-19 MED ORDER — LACTATED RINGERS IV
INTRAVENOUS | Status: DC
Start: 2015-06-19 — End: 2015-06-19
  Administered 2015-06-19: 17:00:00 via INTRAVENOUS

## 2015-06-19 MED ORDER — FENTANYL CITRATE (PF) 50 MCG/ML IJ SOLN
50 mcg/mL | INTRAMUSCULAR | Status: AC
Start: 2015-06-19 — End: ?

## 2015-06-19 MED ORDER — MORPHINE 10 MG/ML INJ SOLUTION
10 mg/ml | INTRAMUSCULAR | Status: DC | PRN
Start: 2015-06-19 — End: 2015-06-19

## 2015-06-19 MED ORDER — LIDOCAINE (PF) 20 MG/ML (2 %) IJ SOLN
20 mg/mL (2 %) | INTRAMUSCULAR | Status: DC | PRN
Start: 2015-06-19 — End: 2015-06-19
  Administered 2015-06-19: 18:00:00 via INTRAVENOUS

## 2015-06-19 MED ORDER — PROTAMINE 10 MG/ML IV
10 mg/mL | INTRAVENOUS | Status: DC | PRN
Start: 2015-06-19 — End: 2015-06-19
  Administered 2015-06-19: 19:00:00 via INTRAVENOUS

## 2015-06-19 MED ORDER — OXYCODONE 5 MG TAB
5 mg | ORAL | Status: DC | PRN
Start: 2015-06-19 — End: 2015-06-19

## 2015-06-19 MED FILL — 1/2 NS WITH POTASSIUM CHLORIDE 20 MEQ/L IV: 20 mEq/L | INTRAVENOUS | Qty: 1000

## 2015-06-19 MED FILL — LACTATED RINGERS IV: INTRAVENOUS | Qty: 1000

## 2015-06-19 MED FILL — CEFAZOLIN 2 GRAM/50 ML NS IVPB: INTRAVENOUS | Qty: 50

## 2015-06-19 MED FILL — ISOVUE-370  76 % INTRAVENOUS SOLUTION: 370 mg iodine /mL (76 %) | INTRAVENOUS | Qty: 50

## 2015-06-19 MED FILL — HYDROCHLOROTHIAZIDE 25 MG TAB: 25 mg | ORAL | Qty: 1

## 2015-06-19 MED FILL — DEXTROSE 5%-LACTATED RINGERS IV: INTRAVENOUS | Qty: 1000

## 2015-06-19 MED FILL — HYDROMORPHONE (PF) 1 MG/ML IJ SOLN: 1 mg/mL | INTRAMUSCULAR | Qty: 1

## 2015-06-19 MED FILL — MIDAZOLAM 1 MG/ML IJ SOLN: 1 mg/mL | INTRAMUSCULAR | Qty: 2

## 2015-06-19 MED FILL — LOVASTATIN 20 MG TAB: 20 mg | ORAL | Qty: 2

## 2015-06-19 MED FILL — BAYER LOW DOSE ASPIRIN 81 MG TABLET,DELAYED RELEASE: 81 mg | ORAL | Qty: 1

## 2015-06-19 MED FILL — FENTANYL CITRATE (PF) 50 MCG/ML IJ SOLN: 50 mcg/mL | INTRAMUSCULAR | Qty: 2

## 2015-06-19 MED FILL — SODIUM BICARBONATE 8.4 % IV: 1 mEq/mL (8.4 %) | INTRAVENOUS | Qty: 50

## 2015-06-19 MED FILL — AMLODIPINE 5 MG TAB: 5 mg | ORAL | Qty: 1

## 2015-06-19 MED FILL — LIDOCAINE HCL 1 % (10 MG/ML) IJ SOLN: 10 mg/mL (1 %) | INTRAMUSCULAR | Qty: 20

## 2015-06-19 MED FILL — LOVENOX 40 MG/0.4 ML SUBCUTANEOUS SYRINGE: 40 mg/0.4 mL | SUBCUTANEOUS | Qty: 0.4

## 2015-06-19 NOTE — Progress Notes (Signed)
Bedside shift change report given to Olivia Mackie, RN (oncoming nurse) by Franchot Gallo (offgoing nurse). Report included the following information SBAR, Kardex, Procedure Summary, Intake/Output, Recent Results and Cardiac Rhythm NSR.

## 2015-06-19 NOTE — Other (Signed)
Patient: Damon [name] MRN: [id number]  SSN: [id number]   Date of Birth: 05-30-1951  Age: 64 y.o.  Sex: male     Patient is status post Procedure(s):  THROMBECTOMY RIGHT LEG.    Surgeon(s) and Role:     * Alesia Morin., MD - Primary    Local/Dose/Irrigation: no local                   Peripheral IV 06/18/15 Left Hand (Active)   Site Assessment Clean, dry, & intact 06/19/2015 11:15 AM   Phlebitis Assessment 0 06/19/2015 11:15 AM   Infiltration Assessment 0 06/19/2015 11:15 AM   Dressing Status Clean, dry, & intact 06/19/2015 11:15 AM   Dressing Type Transparent 06/19/2015 11:15 AM   Hub Color/Line Status Green;Capped 06/19/2015 11:15 AM   Action Taken Open ports on tubing capped 06/19/2015 11:15 AM   Alcohol Cap Used Yes 06/19/2015 11:15 AM       Peripheral IV 06/18/15 Right Hand (Active)   Site Assessment Clean, dry, & intact 06/19/2015 11:15 AM   Phlebitis Assessment 0 06/19/2015 11:15 AM   Infiltration Assessment 0 06/19/2015 11:15 AM   Dressing Status Clean, dry, & intact 06/19/2015 11:15 AM   Dressing Type Transparent;Tape 06/19/2015 11:15 AM   Hub Color/Line Status Pink;Infusing 06/19/2015 11:15 AM   Action Taken Open ports on tubing capped 06/19/2015 11:15 AM   Alcohol Cap Used Yes 06/19/2015 11:15 AM            Airway - Endotracheal Tube 06/19/15 Oral (Active)             Dressing/Packing:  Wound Groin Left-DRESSING TYPE: Adhesive wound dressing (Band-Aid) (06/19/15 1454)  Wound Groin Right-DRESSING TYPE: Adhesive wound closure strips (Steri-Strips);Open to air (06/19/15 1115)  Wound Groin Right-DRESSING TYPE: 4 x 4;Special tape (comment) (hypafix tape) (06/19/15 1454)

## 2015-06-19 NOTE — Progress Notes (Signed)
0941 During morning assessment of patient, pulses were assessed. Patient's pedal pulse and post-tibial to left foot assessed via doppler and present but faint. Patient's right pedal pulse, post tibial and femoral pulse were assessed via doppler but no pulse present or heard. Patient's right foot pale in color and cold to touch. Capillary refill to right foot, greater than 3 seconds. More color noted to left foot and temperature less cool then right foot as well as capillary refills less than 3 seconds. Charge nurse into reassessed patient's right leg and unable to doppler pulses also. MD paged, return call from nurse in [place], information told to nurse in OR and then given to Dr. Kathrin Penner due to MD was scrubbed in. Per MD stated to hold off on discharge and call Dr. Mardene Speak to see patient. Page out to Dr. Mardene Speak. Awaiting return call.    1610 Return call from Dr. Mardene Speak office, spoke with nurse and stated either Dr. Timmothy Sours or Dr. Mardene Speak will be in to see patient some time today.    1005 Return call from Dr. Timmothy Sours, stating either he or Dr. Mardene Speak will be into assess patient.    1025 Call from Dr. Kathrin Penner. Made him aware of assessment of pulses. Stated to cancel discharge and he will be over to see patient.    83 Dr. Kathrin Penner on unit to assess patient. Stated to keep patient NPO. Patient schedule for surgery. Will have consent signed. Patient notified family.

## 2015-06-19 NOTE — Other (Signed)
TRANSFER - OUT REPORT:    Verbal report given to [place] (name) on Damon [name]  being transferred to [place] (unit) for routine post - op       Report consisted of patient???s Situation, Background, Assessment and   Recommendations(SBAR).     Time Pre op antibiotic given:1423   Anesthesia Stop time: 1478    Information from the following report(s) SBAR, Kardex, OR Summary, Intake/Output, MAR, Med Rec Status and Cardiac Rhythm SR was reviewed with the receiving nurse.    Opportunity for questions and clarification was provided.     Is the patient on 02? YES       L/Min 2L       Is the patient on a monitor? YES    Is the nurse transporting with the patient? YES    Surgical Waiting Area notified of patient's transfer from PACU? YES      The following personal items collected during your admission accompanied patient upon transfer:   Dental Appliance: Dental Appliances: None  Vision: Visual Aid: Glasses  Hearing Aid:    Jewelry: Jewelry: None  Clothing: Clothing: Pants, Shirt, Undergarments, Footwear, Belt  Other Valuables:    Valuables sent to safe:

## 2015-06-19 NOTE — Op Note (Signed)
[place]   OP NOTE       Name:  Damon [name], Damon [name]   MR#:  [id number]   DOB:  [birth year]   Account #:  [id number]   Date of Adm:  06/18/2015       PREOPERATIVE DIAGNOSIS: Pulseless right leg 24 hours out from   percutaneous endovascular aneurysm repair.     POSTOPERATIVE DIAGNOSIS: Pulseless right leg 24 hours out from   percutaneous endovascular aneurysm repair.    PROCEDURES PERFORMED   1. Right femoral exploration.   2. Fogarty thrombectomy of common femoral, profunda femoral, and   superficial femoral artery.   3. Direct closure of artery.     SURGEON: Antonieta Pert. Carole Binning., MD.    ANESTHESIA: General.    ESTIMATED BLOOD LOSS: 100 mL.    COMPLICATIONS: None.     SPECIMENS REMOVED: Thrombus from the Perclose site.    INDICATIONS: This is a 64 year old gentleman with severe peripheral   arterial disease and a 5.3 cm aneurysm. He had a percutaneous EVAR   performed yesterday. He did well until just prior to leaving today to go   home, the nurses noted no femoral pulse, no pulses in his foot by   palpation or Doppler, and although the patient was relatively   asymptomatic, a cool leg, we were asked to see the patient and he   indeed had an ischemic leg, but very few symptoms. We opted to take   the patient to the OR, thinking that he may have thrombosed his   common femoral artery at the Perclose site.    DESCRIPTION OF PROCEDURE: After informed consent, he was   taken to the operating room, prepped and draped under general   anesthesia. An incision was made over the right groin. The stick site   was excised. Skin and subcutaneous tissues were divided down to the   vessel. There was no pulse in the common femoral, profunda femoral,   or superficial femoral vessels. Vessel loops were placed around the   vessels and right through the Perclose site the artery was opened. A   #4 Fogarty was passed into the iliac artery and removed approximately   2 cm of fresh clot with vigorous arterial bleeding.  This was flushed with   heparin and saline and clamped after giving the patient 7500 units of   heparin prior to opening the vessel. Distal thrombectomy yielded fresh   clot also from the proximal superficial femoral with vigorous back   bleeding. This was flushed with heparin and saline after 2 additional   passes showed no clot. The profunda Fogarty was placed and a small   amount of clot was removed with vigorous back bleeding. This was   also flushed. The vessel was then closed transversely with running 5-0   Prolene. Flow was restored with excellent Doppler signals and good   hemostasis. Final lap, Ray-Tec, instrument and needle counts were   announced as correct x2. The patient was given 30 mg of protamine   sulfate and some Fibrillar for the arteriotomy closure. The wounds   were then closed in 2 running layers of 2-0 Vicryl, the skin with staples.   Sterile dressings were applied. The patient tolerated the procedure   well and was taken to the recovery room in satisfactory condition.        Alesia Morin., MD      FDS / [facility]   D:  06/19/2015   15:05  T:  06/19/2015   15:36   Job #:  [id number]

## 2015-06-19 NOTE — Anesthesia Pre-Procedure Evaluation (Signed)
Anesthetic History   No history of anesthetic complications            Review of Systems / Medical History  Patient summary reviewed, nursing notes reviewed and pertinent labs reviewed    Pulmonary                   Neuro/Psych   Within defined limits           Cardiovascular    Hypertension              Exercise tolerance: <4 METS     GI/Hepatic/Renal     GERD: well controlled           Endo/Other  Within defined limits           Other Findings              Physical Exam    Airway  Mallampati: II  TM Distance: > 6 cm  Neck ROM: normal range of motion   Mouth opening: Normal     Cardiovascular  Regular rate and rhythm,  S1 and S2 normal,  no murmur, click, rub, or gallop             Dental  No notable dental hx       Pulmonary  Breath sounds clear to auscultation               Abdominal  GI exam deferred       Other Findings            Anesthetic Plan    ASA: 3  Anesthesia type: general    Monitoring Plan: Arterial line and CVP      Induction: Intravenous  Anesthetic plan and risks discussed with: Patient

## 2015-06-19 NOTE — Discharge Summary (Signed)
[place] SUMMARY       Name:  TOMIO, KIRK   MR#:  [id number]   DOB:  Jul 22, 1951   Account #:  [id number]   Date of Adm:  06/18/2015       PREOPERATIVE DIAGNOSIS: Greater than 5 cm infrarenal   abdominal aortic aneurysm.    POSTOPERATIVE DIAGNOSIS: Greater than 5 cm infrarenal   abdominal aortic aneurysm.    PROCEDURES    1. Percutaneous repair of abdominal aortic aneurysm, with balloon   angioplasty of common iliac vessels.   2. Placement of a bifurcated endograft with a proximal cuff.     SURGEON: Antonieta Pert. Carole Binning., MD    HOSPITAL COURSE: A very pleasant 64 year old with a 5.3 cm   infrarenal abdominal aortic aneurysm. He underwent endograft repair   percutaneously. Foley was discontinued the following morning. He had   warm feet, good pulses. He was able to void. He was discharged home   on just Tylenol and his usual medicines. He will be given instructions   regarding activity and hygiene. He will be seen in the office in 2 weeks.        Alesia Morin., MD      FDS / [facility]   D:  06/19/2015   06:46   T:  06/19/2015   09:53   Job #:  [id number]

## 2015-06-19 NOTE — Other (Signed)
Family called with update at 37. Spoke with Harvin Hazel.

## 2015-06-19 NOTE — Other (Signed)
surgicel fibrillar used.

## 2015-06-19 NOTE — Progress Notes (Signed)
10:45 am. CM reviewed chart. Patient is s/p PERCUTANEOUS AORTIC ABDOMINAL ANEURYSM REPAIR ENDOVASCULAR (EVAR) on 06/18/15.    Met with patient, introduced self and discussed discharge planning. Patient lives with his daughter Varney Biles [phone]) in her apartment. He is independent without any assistive devices and was able to drive prior to surgery. He sees his healthcare practitioner Gabriel Earing, NP as needed and uses his local [place] for prescriptions. His daughter Varney Biles will provide transportation home. He did not voice any financial issues or discharge barriers.   Patient will be discharged home in care of his daughter. Will continue to follow. Frederica Kuster, MSA, RN, CRM.  Care Management Interventions  PCP Verified by CM: Yes Catha Nottingham, NP)  Palliative Care Consult: No  Mode of Transport at Discharge: Other (see comment) (Private car)  Transition of Care Consult (CM Consult): Discharge Planning  MyChart Signup: No  Discharge Durable Medical Equipment: No  Physical Therapy Consult: No  Occupational Therapy Consult: No  Speech Therapy Consult: No  Current Support Network: Other (Lives with his daughter Varney Biles.)  Confirm Follow Up Transport: Family  Plan discussed with Pt/Family/Caregiver: Yes  Discharge Location  Discharge Placement: Home with family assistance

## 2015-06-19 NOTE — Brief Op Note (Signed)
BRIEF OPERATIVE NOTE    Date of Procedure: 06/18/2015 - 06/19/2015   Preoperative Diagnosis: common femoral artery thromosis  Postoperative Diagnosis: common femoral artery thromosis    Procedure(s):  THROMBECTOMY RIGHT LEG  Surgeon(s) and Role:     Alesia Morin., MD - Primary            Surgical Staff:  Circ-1: Marcelyn Bruins, RN; Rosine Beat, RN  Circ-2: Delfino Lovett, RN  Scrub RN-1: Gretta Began, RN  Surg Asst-1: Eddie North  Event Time In   Incision Start 1429   Incision Close      Anesthesia: General   Estimated Blood Loss: **153mls*  Specimens: *thrombus from thrombosis of right CFA *   Findings: **thrombosis of CFA at the perclose site*   Complications: **none*  Implants: * No implants in log *

## 2015-06-19 NOTE — Progress Notes (Addendum)
TRANSFER - IN REPORT:    Verbal report received from [place], Therapist, sports (name) on Damon [name]  being received from [facility]) for routine post - op      Report consisted of patient???s Situation, Background, Assessment and   Recommendations(SBAR).     Information from the following report(s) SBAR, Kardex, Procedure Summary, Intake/Output and Cardiac Rhythm NSR was reviewed with the receiving nurse.    Opportunity for questions and clarification was provided.      Assessment completed upon patient???s arrival to unit and care assumed.

## 2015-06-19 NOTE — Other (Signed)
1305: RT popliteal pulse strong per Doppler; RT PT is faint per Doppler and RT DP is not audible by doppler. RT toes are cooler than LT; Capillary refill on RT is > 3secs. Nailbeds are pink both feet but blanch on RT foot. Denies any numbness in feet/toes. Denies any pain.

## 2015-06-19 NOTE — Progress Notes (Signed)
Vascular  No femoral pulse or signal on the right with no pedal pulses. Patient is asymptomatic at rest but needs thrombectomy today. He seems to understand and wishes to proceed.

## 2015-06-19 NOTE — Op Note (Signed)
[name]   OP NOTE       Name:  Damon [name], Damon [name]   MR#:  [id number]   DOB:  February 26, 1951   Account #:  [id number]   Date of Adm:  06/18/2015       PREOPERATIVE DIAGNOSIS: Pulseless right leg 24 hours out from   percutaneous endovascular aneurysm repair.     POSTOPERATIVE DIAGNOSIS: Pulseless right leg 24 hours out from   percutaneous endovascular aneurysm repair.    PROCEDURES PERFORMED   1. Right femoral exploration.   2. Fogarty thrombectomy of common femoral, profunda femoral, and   superficial femoral artery.   3. Direct closure of artery.     SURGEON: Antonieta Pert. Carole Binning., MD.    ANESTHESIA: General.    ESTIMATED BLOOD LOSS: 100 mL.    COMPLICATIONS: None.     SPECIMENS REMOVED: Thrombus from the Perclose site.    INDICATIONS: This is a 64 year old gentleman with severe peripheral   arterial disease and a 5.3 cm aneurysm. He had a percutaneous EVAR   performed yesterday. He did well until just prior to leaving today to go   home, the nurses noted no femoral pulse, no pulses in his foot by   palpation or Doppler, and although the patient was relatively   asymptomatic, a cool leg, we were asked to see the patient and he   indeed had an ischemic leg, but very few symptoms. We opted to take   the patient to the OR, thinking that he may have thrombosed his   common femoral artery at the Perclose site.    DESCRIPTION OF PROCEDURE: After informed consent, he was   taken to the operating room, prepped and draped under general   anesthesia. An incision was made over the right groin. The stick site   was excised. Skin and subcutaneous tissues were divided down to the   vessel. There was no pulse in the common femoral, profunda femoral,   or superficial femoral vessels. Vessel loops were placed around the   vessels and right through the Perclose site the artery was opened. A   #4 Fogarty was passed into the iliac artery and removed approximately    2 cm of fresh clot with vigorous arterial bleeding. This was flushed with   heparin and saline and clamped after giving the patient 7500 units of   heparin prior to opening the vessel. Distal thrombectomy yielded fresh   clot also from the proximal superficial femoral with vigorous back   bleeding. This was flushed with heparin and saline after 2 additional   passes showed no clot. The profunda Fogarty was placed and a small   amount of clot was removed with vigorous back bleeding. This was   also flushed. The vessel was then closed transversely with running 5-0   Prolene. Flow was restored with excellent Doppler signals and good   hemostasis. Final lap, Ray-Tec, instrument and needle counts were   announced as correct x2. The patient was given 30 mg of protamine   sulfate and some Fibrillar for the arteriotomy closure. The wounds   were then closed in 2 running layers of 2-0 Vicryl, the skin with staples.   Sterile dressings were applied. The patient tolerated the procedure   well and was taken to the recovery room in satisfactory condition.        Alesia Morin., MD      FDS / [facility]   D:  06/19/2015   15:05  T:  06/19/2015   15:36   Job #:  [id number]

## 2015-06-19 NOTE — Progress Notes (Signed)
TRANSFER - OUT REPORT:    Verbal report given to [facility], RN(name) on Damon [name]  being transferred to OR holding (unit) for ordered procedure       Report consisted of patient???s Situation, Background, Assessment and   Recommendations(SBAR).     Information from the following report(s) SBAR, Kardex, Intake/Output, Recent Results and Cardiac Rhythm SB was reviewed with the receiving nurse.    Lines:   Peripheral IV 06/18/15 Left Hand (Active)   Site Assessment Clean, dry, & intact 06/19/2015 11:15 AM   Phlebitis Assessment 0 06/19/2015 11:15 AM   Infiltration Assessment 0 06/19/2015 11:15 AM   Dressing Status Clean, dry, & intact 06/19/2015 11:15 AM   Dressing Type Transparent 06/19/2015 11:15 AM   Hub Color/Line Status Green;Capped 06/19/2015 11:15 AM   Action Taken Open ports on tubing capped 06/19/2015 11:15 AM   Alcohol Cap Used Yes 06/19/2015 11:15 AM       Peripheral IV 06/18/15 Right Hand (Active)   Site Assessment Clean, dry, & intact 06/19/2015 11:15 AM   Phlebitis Assessment 0 06/19/2015 11:15 AM   Infiltration Assessment 0 06/19/2015 11:15 AM   Dressing Status Clean, dry, & intact 06/19/2015 11:15 AM   Dressing Type Transparent;Tape 06/19/2015 11:15 AM   Hub Color/Line Status Pink;Infusing 06/19/2015 11:15 AM   Action Taken Open ports on tubing capped 06/19/2015 11:15 AM   Alcohol Cap Used Yes 06/19/2015 11:15 AM        Opportunity for questions and clarification was provided.

## 2015-06-19 NOTE — Other (Signed)
TRANSFER - IN REPORT:    Verbal report received from [place], RN (name) on Damon [name]  being received from [facility] (unit) for routine progression of care      Report consisted of patient???s Situation, Background, Assessment and   Recommendations(SBAR).     Information from the following report(s) SBAR, Kardex, Intake/Output, MAR and Accordion was reviewed with the receiving nurse.    Opportunity for questions and clarification was provided.      Assessment completed upon patient???s arrival to unit and care assumed.

## 2015-06-19 NOTE — Progress Notes (Signed)
Vascular  VSS  No complaints  Feet warm  Labs normal  Plan to d/c foley and send home on tylenol once he voids  Return to office in 2 weeks

## 2015-06-20 MED ORDER — NITROGLYCERIN 2 % TRANSDERMAL OINTMENT
2 % | TRANSDERMAL | Status: AC
Start: 2015-06-20 — End: 2015-06-20
  Administered 2015-06-20: 05:00:00 via TOPICAL

## 2015-06-20 MED ORDER — ALUM-MAG HYDROXIDE-SIMETH 400 MG-400 MG-40 MG/5 ML ORAL SUSP
400-400-40 mg/5 mL | ORAL | Status: DC | PRN
Start: 2015-06-20 — End: 2015-06-20
  Administered 2015-06-20: 11:00:00 via ORAL

## 2015-06-20 MED ORDER — NITROGLYCERIN 2 % TRANSDERMAL OINTMENT
2 % | Freq: Four times a day (QID) | TRANSDERMAL | Status: DC | PRN
Start: 2015-06-20 — End: 2015-06-20

## 2015-06-20 MED ORDER — NITROGLYCERIN 2 % TRANSDERMAL OINTMENT
2 % | Freq: Four times a day (QID) | TRANSDERMAL | Status: DC
Start: 2015-06-20 — End: 2015-06-20

## 2015-06-20 MED FILL — DIPRIVAN 10 MG/ML INTRAVENOUS EMULSION: 10 mg/mL | INTRAVENOUS | Qty: 20

## 2015-06-20 MED FILL — 1/2 NS WITH POTASSIUM CHLORIDE 20 MEQ/L IV: 20 mEq/L | INTRAVENOUS | Qty: 1000

## 2015-06-20 MED FILL — XYLOCAINE-MPF 20 MG/ML (2 %) INJECTION SOLUTION: 20 mg/mL (2 %) | INTRAMUSCULAR | Qty: 5

## 2015-06-20 MED FILL — HYDROCODONE-ACETAMINOPHEN 7.5 MG-325 MG TAB: ORAL | Qty: 1

## 2015-06-20 MED FILL — NITRO-BID 2 % TRANSDERMAL OINTMENT: 2 % | TRANSDERMAL | Qty: 1

## 2015-06-20 MED FILL — ONDANSETRON (PF) 4 MG/2 ML INJECTION: 4 mg/2 mL | INTRAMUSCULAR | Qty: 2

## 2015-06-20 MED FILL — BAYER LOW DOSE ASPIRIN 81 MG TABLET,DELAYED RELEASE: 81 mg | ORAL | Qty: 1

## 2015-06-20 MED FILL — NEOSTIGMINE METHYLSULFATE 1 MG/ML INJECTION: 1 mg/mL | INTRAMUSCULAR | Qty: 3

## 2015-06-20 MED FILL — LOVENOX 40 MG/0.4 ML SUBCUTANEOUS SYRINGE: 40 mg/0.4 mL | SUBCUTANEOUS | Qty: 0.4

## 2015-06-20 MED FILL — GLYCOPYRROLATE 0.2 MG/ML IJ SOLN: 0.2 mg/mL | INTRAMUSCULAR | Qty: 4

## 2015-06-20 MED FILL — HYDROCHLOROTHIAZIDE 25 MG TAB: 25 mg | ORAL | Qty: 1

## 2015-06-20 MED FILL — PROTAMINE 10 MG/ML IV: 10 mg/mL | INTRAVENOUS | Qty: 3

## 2015-06-20 MED FILL — MAG-AL PLUS EXTRA STRENGTH 400 MG-400 MG-40 MG/5 ML ORAL SUSPENSION: 400-400-40 mg/5 mL | ORAL | Qty: 30

## 2015-06-20 MED FILL — PHENYLEPHRINE IN 0.9 % SODIUM CL (40 MCG/ML) IV SYRINGE: 0.4 mg/10 mL (40 mcg/mL) | INTRAVENOUS | Qty: 4

## 2015-06-20 MED FILL — ROCURONIUM 10 MG/ML IV: 10 mg/mL | INTRAVENOUS | Qty: 2

## 2015-06-20 MED FILL — AMLODIPINE 5 MG TAB: 5 mg | ORAL | Qty: 1

## 2015-06-20 MED FILL — QUELICIN 20 MG/ML INJECTION SOLUTION: 20 mg/mL | INTRAMUSCULAR | Qty: 7

## 2015-06-20 MED FILL — LOVASTATIN 20 MG TAB: 20 mg | ORAL | Qty: 2

## 2015-06-20 MED FILL — VAZCULEP 10 MG/ML INJECTION SOLUTION: 10 mg/mL | INTRAMUSCULAR | Qty: 1

## 2015-06-20 MED FILL — BD POSIFLUSH NORMAL SALINE 0.9 % INJECTION SYRINGE: INTRAMUSCULAR | Qty: 10

## 2015-06-20 MED FILL — FAMOTIDINE 20 MG TAB: 20 mg | ORAL | Qty: 1

## 2015-06-20 MED FILL — HYDROMORPHONE (PF) 1 MG/ML IJ SOLN: 1 mg/mL | INTRAMUSCULAR | Qty: 1

## 2015-06-20 MED FILL — DEXAMETHASONE SODIUM PHOSPHATE 4 MG/ML IJ SOLN: 4 mg/mL | INTRAMUSCULAR | Qty: 2

## 2015-06-20 NOTE — Progress Notes (Signed)
Vascular  Good signals in the right foot  Labs OK  Home on tylenol

## 2015-06-20 NOTE — Progress Notes (Signed)
Problem: Falls - Risk of  Goal: *Absence of falls  Outcome: Progressing Towards Goal  Patient to remain fall free while in hospital. Call bell , phone and bedside  Tray within reach. Bathroom light on at all times.     Problem: Pressure Ulcer - Risk of  Goal: *Prevention of pressure ulcer  Outcome: Progressing Towards Goal  Patient to remain pressure ulcer free while in the hospital. Patient is able to turn and reposition self.     Problem: Discharge Planning  Goal: *Discharge to safe environment  Outcome: Progressing Towards Goal  [place] working with patient for discharge to safe home enviroment.

## 2015-06-20 NOTE — Progress Notes (Signed)
Bedside and Verbal shift change report given to [place] (Soil scientist) by Meryle Ready  (offgoing nurse). Report included the following information SBAR, Kardex, Intake/Output, Recent Results and Cardiac Rhythm sinus Loletha Grayer to Normal Sinus.

## 2015-06-20 NOTE — Progress Notes (Signed)
Patient with bp of 209/87 and pulse 110. Patient complains of epigastric burning and a headache. Notified Dr Elder Negus team. Dr Aggie Moats returned page and gave order for Nitropaste 1" Q 6 hours if sbp is greater than 180.

## 2015-06-20 NOTE — Progress Notes (Signed)
Patient's sbp steady trending down since placement of 1" nitro paste. BP is now 115/677 p 64 Nitro paste removed at this time.

## 2015-06-20 NOTE — Anesthesia Post-Procedure Evaluation (Signed)
Post-Anesthesia Evaluation and Assessment    Patient: Damon [name] MRN: [id number]  SSN: [id number]    Date of Birth: 03/29/1951  Age: 64 y.o.  Sex: male       Cardiovascular Function/Vital Signs  Visit Vitals   ??? BP 168/82 (BP 1 Location: Right arm, BP Patient Position: At rest)   ??? Pulse 67   ??? Temp 36.7 ??C (98.1 ??F)   ??? Resp 16   ??? Ht 5\' 10"  (1.778 m)   ??? Wt 82.1 kg (181 lb)   ??? SpO2 96%   ??? BMI 25.97 kg/m2       Patient is status post general anesthesia for Procedure(s):  THROMBECTOMY RIGHT LEG.    Nausea/Vomiting: None    Postoperative hydration reviewed and adequate.    Pain:  Pain Scale 1: Numeric (0 - 10) (06/20/15 0130)  Pain Intensity 1: 7 (06/20/15 0130)   Managed    Neurological Status:   Neuro (WDL): Within Defined Limits (06/19/15 1515)  Neuro  Neurologic State: Alert;Appropriate for age;Eyes open spontaneously (06/20/15 0049)  LUE Motor Response: Purposeful (06/20/15 0049)  LLE Motor Response: Purposeful (06/20/15 0049)  RUE Motor Response: Purposeful (06/20/15 0049)  RLE Motor Response: Purposeful (06/20/15 0049)   At baseline    Mental Status and Level of Consciousness: Arousable    Pulmonary Status:   O2 Device: Room air (06/20/15 0049)   Adequate oxygenation and airway patent    Complications related to anesthesia: None    Post-anesthesia assessment completed. No concerns    Signed By: Anthony Sar, MD     June 20, 2015

## 2015-06-20 NOTE — Other (Shared)
IDR/SLIDR Summary          Patient: SHANAN MCMILLER MRN: [id number]    Age: 64 y.o.     Birthdate: 1950/12/05 Room/Bed: 444/01   Admit Diagnosis: AAA  Abdominal aortic aneurysm without rupture ([place])  unknown  Principal Diagnosis: <principal problem not specified>     Goals: ambulation  Readmission: NO  Quality Measure: SCIP  VTE Prophylaxis: Not needed  Influenza Vaccine screening completed? NO  Pneumococcal Vaccine screening completed? NO  Mobility needs: No   Nutrition plan:Yes  Consults:{Consult needed:21347}    Financial concerns:No  Escalated to CM? {YES/NO:18482}  RRAT Score: 12   Interventions:H2H  Testing due for pt today? NO  LOS: 2 days Expected length of stay 1 21 days  Discharge plan: home   PCP: Francisca December, NP  Transportation needs: No    Days before discharge:one day until discharge   Discharge disposition: Home      Interdisciplinary team rounds were held 06/20/2015 with the following team members:{INTERDISCIPLINARY TEAM:19067} and the {Persons; family members:18367}.    Plan of care discussed. See clinical pathway and/or care plan for interventions and desired outcomes.    Signed:     Lucillie Garfinkel, RN  06/20/2015  5:17 AM

## 2015-06-20 NOTE — Discharge Summary (Signed)
[place] SUMMARY       Name:  Damon [name], Damon [name]   MR#:  [id number]   DOB:  May 01, 1951   Account #:  [id number]   Date of Adm:  06/18/2015       ADDENDUM    The patient was basically ready for discharge. Nurse noted his right leg   to be cool. He had no femoral pulse. We were called.    The patient was taken down to the operating room on the afternoon of   06/19/2015 at 2 o'clock and had his right femoral area explored where   the Perclose had been placed for his percutaneous EVAR, the vessel   was occluded. We did a transverse arteriotomy and thrombectomy and   closure. The patient then had signals in his foot. He had no problems   through the night. He was able to void. He was discharged home on   his usual medications with all the other discharge plans to be seen in   the office in 2 weeks.        Alesia Morin., MD      FDS / [facility]   D:  06/20/2015   06:22   T:  06/20/2015   11:28   Job #:  [id number]

## 2015-06-21 NOTE — Progress Notes (Signed)
[facility] Follow Up for [facility] Admission from 06/18/15 - 06/20/15 for Percutaneous AAA repair endovascular (EVAR).      RRAT score: 12 Low  Medical History:     Past Medical History   Diagnosis Date   ??? 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 GERD (gastroesophageal reflux disease)    ??? HTN (hypertension)    ??? Hyperlipidemia        This represents Transitions of Care b/c NN spoke with patient and/or caregiver within 1 business days of discharge.  Pt's TCM follow up appt is scheduled with Dr. Kathrin Penner on 07/02/15  @ 1:30 pm which is within 12 days of discharge. He also scheduled a f/up with Catha Nottingham for 07/04/15 for his routine follow up appointment.     Called patient on 06/21/15 and verified with 2 identifiers. He reported that he would like to wait until Monday to have follow up call as he was tired and sore from recent hospital discharge. He did not have his wife present to talk on the phone in his place. We agreed to touch base on Monday.     Patient presenting symptoms scheduled for surgery.     Course of current Hospitalization (referenced by discharge summary 06/19/15 Dr. Elder Negus note): HOSPITAL COURSE: A very pleasant 64 year old with a 5.3 cm ??  infrarenal abdominal aortic aneurysm. He underwent endograft repair ??  percutaneously. Foley was discontinued the following morning. He had ??  warm feet, good pulses. He was able to void. He was discharged home ??  on just Tylenol and his usual medicines. He will be given instructions ??  regarding activity and hygiene. He will be seen in the office in 2 weeks.  Copied from 06/20/15 addendum from Dr. Kathrin Penner : ADDENDUM  ??  The patient was basically ready for discharge. Nurse noted his right leg   to be cool. He had no femoral pulse. We were called.  ??   The patient was taken down to the operating room on the afternoon of   06/19/2015 at 2 o'clock and had his right femoral area explored where   the Perclose had been placed for his percutaneous EVAR, the vessel   was occluded. We did a transverse arteriotomy and thrombectomy and   closure. The patient then had signals in his foot. He had no problems   through the night. He was able to void. He was discharged home on   his usual medications with all the other discharge plans to be seen in   the office in 2 weeks.  ??    Diagnosed with see above. Admitted to General Surgery Service with consults from n/a.  Consults recommended n/a.     Significant Lab/Diagnostic Findings:   Recent Results (from the past 168 hour(s))   METABOLIC PANEL, BASIC    Collection Time: 06/19/15  2:56 AM   Result Value Ref Range    Sodium 136 136 - 145 mmol/L    Potassium 4.2 3.5 - 5.1 mmol/L    Chloride 103 97 - 108 mmol/L    CO2 23 21 - 32 mmol/L    Anion gap 10 5 - 15 mmol/L    Glucose 113 (H) 65 - 100 mg/dL    BUN 15 6 - 20 MG/DL    Creatinine 1.08 0.70 -  1.30 MG/DL    BUN/Creatinine ratio 14 12 - 20      GFR est AA >60 >60 ml/min/1.73m2    GFR est non-AA >60 >60 ml/min/1.86m2    Calcium 8.7 8.5 - 10.1 MG/DL   CBC W/O DIFF    Collection Time: 06/19/15  2:56 AM   Result Value Ref Range    WBC 10.5 4.1 - 11.1 K/uL    RBC 4.62 4.10 - 5.70 M/uL    HGB 13.0 12.1 - 17.0 g/dL    HCT 38.3 36.6 - 50.3 %    MCV 82.9 80.0 - 99.0 FL    MCH 28.1 26.0 - 34.0 PG    MCHC 33.9 30.0 - 36.5 g/dL    RDW 14.1 11.5 - 14.5 %    PLATELET 269 150 - 400 K/uL         Medication Reconciliation completed: will discuss with pt next week  New medications at discharge include none.  Prescription Medication total: 5 (pharmacy consult for polypharm needed?) no     If multiple admissions, ED visits, check and reviewed PMP:  N/A.    Barriers to care?  none noted.     Support System consists of: family    Previous Use of [facility], if so what agency? no      Advance Medical Directive on file in EMR? no will discuss with pt during follow up phone call. 06/24/15: Pt says he has materials at home from [facility] and is "working on them".     Future Plan for Patient to include communication plan: 06/24/15: I reach pt today and gave him my contact information. He scheduled a follow up with Catha Nottingham for after his appointment with Dr. Kathrin Penner on 07/02/15. He will see Lattie Haw on 07/04/15. We reviewed his discharge instructions and he reports that his complaints are mostly pain is still a "5" but it has improved since I talked with him on Friday. He is walking around and denies any signs of infection at the site. He says that the leg remains warm as far as circulation. He says his appetite is limited and he is also low on energy. He had a BM yesterday and reports that he is feeling some improvement since that. He also notes that he quit smoking on 8/31 cold [place] and denies any craving for cigarettes. Pt has help from family and will call Dr. Elder Negus office if his pain does not continue to improve. He is only taking tylenol at this time.     Adherence to previous treatment and likelihood for f/u: Pt is engaged, recently quit smoking.     Past Hospitalizations/ED visits last 12 months? has never been hospitalized No known ER visits in last 12 months/ 0 known ER visits in last 12 months.

## 2015-06-24 ENCOUNTER — Inpatient Hospital Stay
Admit: 2015-06-24 | Discharge: 2015-06-27 | Disposition: A | Discharge status: Home / Self Care | Payer: MEDICARE | Attending: Hospitalist | Admitting: Hospitalist

## 2015-06-24 ENCOUNTER — Emergency Department: Admit: 2015-06-24 | Payer: MEDICARE | Primary: Internal Medicine

## 2015-06-24 DIAGNOSIS — K210 Gastro-esophageal reflux disease with esophagitis: Principal | ICD-10-CM

## 2015-06-24 LAB — URINALYSIS W/ REFLEX CULTURE
Bacteria: NEGATIVE /hpf
Bilirubin: NEGATIVE
Blood: NEGATIVE
Glucose: NEGATIVE mg/dL
Leukocyte Esterase: NEGATIVE
Nitrites: NEGATIVE
Protein: NEGATIVE mg/dL
Specific gravity: 1.02 (ref 1.003–1.030)
Urobilinogen: 0.2 EU/dL (ref 0.2–1.0)
pH (UA): 6.5 (ref 5.0–8.0)

## 2015-06-24 LAB — METABOLIC PANEL, COMPREHENSIVE
A-G Ratio: 0.7 — ABNORMAL LOW (ref 1.1–2.2)
ALT (SGPT): 33 U/L (ref 12–78)
AST (SGOT): 32 U/L (ref 15–37)
Albumin: 3.3 g/dL — ABNORMAL LOW (ref 3.5–5.0)
Alk. phosphatase: 93 U/L (ref 45–117)
Anion gap: 11 mmol/L (ref 5–15)
BUN/Creatinine ratio: 30 — ABNORMAL HIGH (ref 12–20)
BUN: 40 MG/DL — ABNORMAL HIGH (ref 6–20)
Bilirubin, total: 0.7 MG/DL (ref 0.2–1.0)
CO2: 23 mmol/L (ref 21–32)
Calcium: 9.7 MG/DL (ref 8.5–10.1)
Chloride: 99 mmol/L (ref 97–108)
Creatinine: 1.34 MG/DL — ABNORMAL HIGH (ref 0.70–1.30)
GFR est AA: >60 mL/min/{1.73_m2} (ref >60)
GFR est non-AA: 54 mL/min/{1.73_m2} — ABNORMAL LOW (ref >60)
Globulin: 4.6 g/dL — ABNORMAL HIGH (ref 2.0–4.0)
Glucose: 115 mg/dL — ABNORMAL HIGH (ref 65–100)
Potassium: 4.3 mmol/L (ref 3.5–5.1)
Protein, total: 7.9 g/dL (ref 6.4–8.2)
Sodium: 133 mmol/L — ABNORMAL LOW (ref 136–145)

## 2015-06-24 LAB — CBC WITH AUTOMATED DIFF
ABS. BASOPHILS: 0 10*3/uL (ref 0.0–0.1)
ABS. EOSINOPHILS: 0.2 10*3/uL (ref 0.0–0.4)
ABS. LYMPHOCYTES: 1.5 10*3/uL (ref 0.8–3.5)
ABS. MONOCYTES: 1.3 10*3/uL — ABNORMAL HIGH (ref 0.0–1.0)
ABS. NEUTROPHILS: 7.4 10*3/uL (ref 1.8–8.0)
BASOPHILS: 0 % (ref 0–1)
EOSINOPHILS: 2 % (ref 0–7)
HCT: 40.4 % (ref 36.6–50.3)
HGB: 13.7 g/dL (ref 12.1–17.0)
LYMPHOCYTES: 14 % (ref 12–49)
MCH: 28.5 PG (ref 26.0–34.0)
MCHC: 33.9 g/dL (ref 30.0–36.5)
MCV: 84.2 FL (ref 80.0–99.0)
MONOCYTES: 12 % (ref 5–13)
NEUTROPHILS: 72 % (ref 32–75)
PLATELET: 359 10*3/uL (ref 150–400)
RBC: 4.8 M/uL (ref 4.10–5.70)
RDW: 14.6 % — ABNORMAL HIGH (ref 11.5–14.5)
WBC: 10.4 10*3/uL (ref 4.1–11.1)

## 2015-06-24 MED ORDER — SODIUM CHLORIDE 0.9 % IV
INTRAVENOUS | Status: DC
Start: 2015-06-24 — End: 2015-06-27
  Administered 2015-06-24 – 2015-06-27 (×4): via INTRAVENOUS

## 2015-06-24 MED ORDER — SODIUM CHLORIDE 0.9% BOLUS IV
0.9 % | Freq: Once | INTRAVENOUS | Status: AC
Start: 2015-06-24 — End: 2015-06-24
  Administered 2015-06-24: 21:00:00 via INTRAVENOUS

## 2015-06-24 MED ORDER — LACTULOSE 10 GRAM/15 ML ORAL SOLUTION
10 gram/15 mL | Freq: Three times a day (TID) | ORAL | Status: DC
Start: 2015-06-24 — End: 2015-06-25
  Administered 2015-06-25 (×3): via ORAL

## 2015-06-24 MED ORDER — SODIUM CHLORIDE 0.9 % IJ SYRG
INTRAMUSCULAR | Status: DC | PRN
Start: 2015-06-24 — End: 2015-06-26

## 2015-06-24 MED ORDER — PANTOPRAZOLE 40 MG IV SOLR
40 mg | INTRAVENOUS | Status: AC
Start: 2015-06-24 — End: 2015-06-24
  Administered 2015-06-24: 22:00:00 via INTRAVENOUS

## 2015-06-24 MED ORDER — IOPAMIDOL 76 % IV SOLN
370 mg iodine /mL (76 %) | Freq: Once | INTRAVENOUS | Status: AC
Start: 2015-06-24 — End: 2015-06-24
  Administered 2015-06-24: 21:00:00 via INTRAVENOUS

## 2015-06-24 MED ORDER — SODIUM CHLORIDE 0.9 % IJ SYRG
Freq: Three times a day (TID) | INTRAMUSCULAR | Status: DC
Start: 2015-06-24 — End: 2015-06-26
  Administered 2015-06-25 – 2015-06-26 (×3): via INTRAVENOUS

## 2015-06-24 MED ORDER — SODIUM CHLORIDE 0.9 % IV
Freq: Once | INTRAVENOUS | Status: AC
Start: 2015-06-24 — End: 2015-06-24
  Administered 2015-06-24: 21:00:00 via INTRAVENOUS

## 2015-06-24 MED ORDER — ONDANSETRON (PF) 4 MG/2 ML INJECTION
4 mg/2 mL | INTRAMUSCULAR | Status: AC
Start: 2015-06-24 — End: 2015-06-24
  Administered 2015-06-24: 21:00:00 via INTRAVENOUS

## 2015-06-24 MED ORDER — SODIUM CHLORIDE 0.9 % IJ SYRG
Freq: Once | INTRAMUSCULAR | Status: AC
Start: 2015-06-24 — End: 2015-06-24
  Administered 2015-06-24: 21:00:00 via INTRAVENOUS

## 2015-06-24 MED FILL — ISOVUE-370  76 % INTRAVENOUS SOLUTION: 370 mg iodine /mL (76 %) | INTRAVENOUS | Qty: 100

## 2015-06-24 MED FILL — ONDANSETRON (PF) 4 MG/2 ML INJECTION: 4 mg/2 mL | INTRAMUSCULAR | Qty: 2

## 2015-06-24 MED FILL — SODIUM CHLORIDE 0.9 % IV: INTRAVENOUS | Qty: 1000

## 2015-06-24 MED FILL — MONOJECT PREFILL ADVANCED 0.9 % SODIUM CHLORIDE INJECTION SYRINGE: INTRAMUSCULAR | Qty: 10

## 2015-06-24 MED FILL — PROTONIX 40 MG INTRAVENOUS SOLUTION: 40 mg | INTRAVENOUS | Qty: 40

## 2015-06-24 NOTE — Progress Notes (Signed)
TRANSFER - IN REPORT:    Verbal report received from Kathyrn,RN(name) on Damon [name]  being received from ED(unit) for routine progression of care      Report consisted of patient???s Situation, Background, Assessment and   Recommendations(SBAR).     Information from the following report(s) SBAR, Kardex, ED Summary and Intake/Output was reviewed with the receiving nurse.    Opportunity for questions and clarification was provided.      Assessment completed upon patient???s arrival to unit and care assumed.

## 2015-06-24 NOTE — ED Notes (Signed)
Patient to X-ray via WC.

## 2015-06-24 NOTE — Other (Signed)
TRANSFER - OUT REPORT:    Verbal report given to Davon, RN(name) on Damon [name]  being transferred to 5S(unit) for routine progression of care       Report consisted of patient???s Situation, Background, Assessment and   Recommendations(SBAR).     Information from the following report(s) SBAR, ED Summary and MAR was reviewed with the receiving nurse.    Lines:   Peripheral IV 06/24/15 Left Hand (Active)   Site Assessment Clean, dry, & intact 06/24/2015  2:37 PM   Phlebitis Assessment 0 06/24/2015  2:37 PM   Infiltration Assessment 0 06/24/2015  2:37 PM   Dressing Status Clean, dry, & intact 06/24/2015  2:37 PM   Dressing Type Tape;Transparent 06/24/2015  2:37 PM   Hub Color/Line Status Pink;Flushed;Capped;Patent 06/24/2015  2:37 PM   Action Taken Blood drawn 06/24/2015  2:37 PM        Opportunity for questions and clarification was provided.

## 2015-06-24 NOTE — ED Provider Notes (Addendum)
Patient is a 64 y.o. male presenting with constipation.   Constipation    Associated symptoms include constipation.       64y M with hx of AAA repair (via femoral artery approach) about a week ago here with diffuse abd pain for about a week or so. No vomiting. Some nausea. No fever. Had a bowel movement yesterday after recent hospitalization and it seemed darker than normal. No blood noted. No dizziness or syncope. Pain is across the front, lower portion of the abdomen diffusely and doesn't radiate. At times also with some epigastric pain. Nothing brings it on. Nothing makes it better or worse. No chest pain. No shortness of breath. No hx of prior abdominal surgeries.    Past Medical History:   Diagnosis Date   ??? 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 GERD (gastroesophageal reflux disease)    ??? HTN (hypertension)    ??? Hyperlipidemia        Past Surgical History:   Procedure Laterality Date   ??? Vascular surgery procedure unlist       ENdovas aneurysm repair         Family History:   Problem Relation Age of Onset   ??? Hypertension Mother    ??? Hypertension Sister    ??? Kidney Disease Sister    ??? Hypertension Brother    ??? Heart Disease Brother    ??? Kidney Disease Sister      on dialysis   ??? Heart Disease Sister    ??? Hypertension Sister    ??? Diabetes Father    ??? Heart Disease Father    ??? Hypertension Father    ??? Diabetes Paternal Uncle      deceased, 32's, heart trouble       Social History     Social History   ??? Marital status: SINGLE     Spouse name: N/A   ??? Number of children: N/A   ??? Years of education: N/A     Occupational History   ??? Not on file.     Social History Main Topics   ??? Smoking status: Current Every Day Smoker     Packs/day: 0.50     Years: 44.00     Types: Cigarettes     Start date: 12/11/1967   ??? Smokeless tobacco: Former Systems developer     Quit date: 05/22/2015       Comment: 1/2 pack cig. a day   ??? Alcohol use 0.0 oz/week     0 Standard drinks or equivalent per week      Comment: occasional   ??? Drug use: No   ??? Sexual activity: Not on file     Other Topics Concern   ??? Not on file     Social History Narrative         ALLERGIES: Review of patient's allergies indicates no known allergies.    Review of Systems   Gastrointestinal: Positive for constipation.   Review of Systems   Constitutional: (-) weight loss.   HEENT: (-) stiff neck   Eyes: (-) discharge.   Respiratory: (-) for cough.    Cardiovascular: (-) syncope.   Gastrointestinal: (-) vomiting.  Genitourinary: (-) hematuria.  Musculoskeletal: (-) myalgias.   Neurological: (-) seizure.   Skin: (-) petechiae  Lymph/Immunologic: (-) enlarged lymph nodes  All other systems reviewed and are negative.        Vitals:    06/24/15 1341 06/24/15 1608   BP: 135/86 109/68   Pulse: 72 92   Resp: 20 20   Temp: 98.4 ??F (36.9 ??C) 98.6 ??F (37 ??C)   SpO2: 97% 98%   Weight: 80.3 kg (177 lb 2 oz)    Height: 5\' 10"  (1.778 m)             Physical Exam Nursing note and vitals reviewed.  Constitutional: oriented to person, place, and time. appears well-developed and well-nourished. No distress.  Head: Normocephalic and atraumatic. Sclera anicteric  Nose: No rhinorrhea  Mouth/Throat: Oropharynx is clear and moist. Pharynx normal  Eyes: Conjunctivae are normal. Pupils are equal, round, and reactive to light. Right eye exhibits no discharge. Left eye exhibits no discharge.  Neck: Painless normal range of motion. Neck supple. No LAD.  Cardiovascular: Normal rate, regular rhythm, normal heart sounds and intact distal pulses.  Exam reveals no gallop and no friction rub.  No murmur heard.  Pulmonary/Chest:  No respiratory distress. No wheezes. No rales. No rhonchi. No increased work of breathing. No accessory muscle use. Good air exchange throughout.  Abdominal: soft, non-tender, slightly distended, no rebound or guarding.  No hepatosplenomegaly. Normal bowel sounds throughout.  Back: no tenderness to palpation, no deformities, no CVA tenderness  Extremities/Musculoskeletal: Normal range of motion. no tenderness. No edema. Distal extremities are neurovasc intact.  Lymphadenopathy:   No adenopathy.   Neurological:  Alert and oriented to person, place, and time. Coordination normal. CN 2-12 intact. Motor and sensory function intact.  Skin: Skin is warm and dry. No rash noted. No pallor.       MDM 64y M here with abdominal pain. No tenderness on exam, but slightly distended. KUB ok. Labs ok. Will need CT abd/pelvis for further evaluation.    ED Course       Procedures    5:26 PM  Stool looks like melena and is hemocult positive. Prostate enlarged but non-tender on exam. Also with distended bladder and enlarged prostate on CT (but otherwise ok). Pt reports poor urination and not emptying his bladder. Will check bladder scan and likely need to place a foley for relief.   Given GI bleed (no prior hx), will admit for further evaluation.

## 2015-06-24 NOTE — ED Notes (Signed)
Pt denies any needs @ this time; Updated on plan of care and admission status

## 2015-06-24 NOTE — ED Notes (Signed)
Bladder scan shows >921mL; Foley placed; Approx 1620mL yellow urine drained; Pt feels relief following foley placement

## 2015-06-24 NOTE — ED Triage Notes (Signed)
Triage Note: Patient has had multiple abdominal surgeries in the last 2 weeks. On Friday started with abdominal bloating and today stated that his stool was very dark. Denies taking narcotics for pain.

## 2015-06-24 NOTE — Progress Notes (Signed)
Primary Nurse Preston Fleeting, RN and Angie, RN performed a dual skin assessment on this patient Impairment noted- see wound doc flow sheet   Right groin incision  Braden score is 20

## 2015-06-25 ENCOUNTER — Observation Stay: Admit: 2015-06-25 | Payer: MEDICARE | Primary: Internal Medicine

## 2015-06-25 DIAGNOSIS — K922 Gastrointestinal hemorrhage, unspecified: Secondary | ICD-10-CM

## 2015-06-25 HISTORY — DX: Gastrointestinal hemorrhage, unspecified: K92.2

## 2015-06-25 LAB — HGB & HCT
HCT: 36.4 % — ABNORMAL LOW (ref 36.6–50.3)
HCT: 34.7 % — ABNORMAL LOW (ref 36.6–50.3)
HCT: 33.3 % — ABNORMAL LOW (ref 36.6–50.3)
HCT: 35.5 % — ABNORMAL LOW (ref 36.6–50.3)
HGB: 12.3 g/dL (ref 12.1–17.0)
HGB: 11.9 g/dL — ABNORMAL LOW (ref 12.1–17.0)
HGB: 11.2 g/dL — ABNORMAL LOW (ref 12.1–17.0)
HGB: 11.8 g/dL — ABNORMAL LOW (ref 12.1–17.0)

## 2015-06-25 LAB — METABOLIC PANEL, BASIC
Anion gap: 10 mmol/L (ref 5–15)
BUN/Creatinine ratio: 26 — ABNORMAL HIGH (ref 12–20)
BUN: 32 MG/DL — ABNORMAL HIGH (ref 6–20)
CO2: 26 mmol/L (ref 21–32)
Calcium: 8.5 MG/DL (ref 8.5–10.1)
Chloride: 101 mmol/L (ref 97–108)
Creatinine: 1.21 MG/DL (ref 0.70–1.30)
GFR est AA: >60 mL/min/{1.73_m2} (ref >60)
GFR est non-AA: >60 mL/min/{1.73_m2} (ref >60)
Glucose: 118 mg/dL — ABNORMAL HIGH (ref 65–100)
Potassium: 4.1 mmol/L (ref 3.5–5.1)
Sodium: 137 mmol/L (ref 136–145)

## 2015-06-25 LAB — METABOLIC PANEL, COMPREHENSIVE
A-G Ratio: 0.7 — ABNORMAL LOW (ref 1.1–2.2)
ALT (SGPT): 23 U/L (ref 12–78)
AST (SGOT): 14 U/L — ABNORMAL LOW (ref 15–37)
Albumin: 2.7 g/dL — ABNORMAL LOW (ref 3.5–5.0)
Alk. phosphatase: 79 U/L (ref 45–117)
Anion gap: 9 mmol/L (ref 5–15)
BUN/Creatinine ratio: 24 — ABNORMAL HIGH (ref 12–20)
BUN: 26 MG/DL — ABNORMAL HIGH (ref 6–20)
Bilirubin, total: 0.9 MG/DL (ref 0.2–1.0)
CO2: 25 mmol/L (ref 21–32)
Calcium: 8.4 MG/DL — ABNORMAL LOW (ref 8.5–10.1)
Chloride: 102 mmol/L (ref 97–108)
Creatinine: 1.08 MG/DL (ref 0.70–1.30)
GFR est AA: >60 mL/min/{1.73_m2} (ref >60)
GFR est non-AA: >60 mL/min/{1.73_m2} (ref >60)
Globulin: 3.9 g/dL (ref 2.0–4.0)
Glucose: 105 mg/dL — ABNORMAL HIGH (ref 65–100)
Potassium: 4 mmol/L (ref 3.5–5.1)
Protein, total: 6.6 g/dL (ref 6.4–8.2)
Sodium: 136 mmol/L (ref 136–145)

## 2015-06-25 LAB — PROTHROMBIN TIME + INR
INR: 1 (ref 0.9–1.1)
Prothrombin time: 10.3 s (ref 9.0–11.1)

## 2015-06-25 MED ORDER — PANTOPRAZOLE 40 MG IV SOLR
40 mg | Freq: Two times a day (BID) | INTRAVENOUS | Status: DC
Start: 2015-06-25 — End: 2015-06-25

## 2015-06-25 MED ORDER — SODIUM CHLORIDE 0.9 % IJ SYRG
INTRAMUSCULAR | Status: DC | PRN
Start: 2015-06-25 — End: 2015-06-27

## 2015-06-25 MED ORDER — AMLODIPINE 5 MG TAB
5 mg | Freq: Every day | ORAL | Status: DC
Start: 2015-06-25 — End: 2015-06-27
  Administered 2015-06-25 – 2015-06-27 (×3): via ORAL

## 2015-06-25 MED ORDER — LOVASTATIN 20 MG TAB
20 mg | Freq: Every evening | ORAL | Status: DC
Start: 2015-06-25 — End: 2015-06-27
  Administered 2015-06-25 – 2015-06-27 (×3): via ORAL

## 2015-06-25 MED ORDER — FAMOTIDINE 20 MG TAB
20 mg | Freq: Two times a day (BID) | ORAL | Status: DC | PRN
Start: 2015-06-25 — End: 2015-06-25

## 2015-06-25 MED ORDER — TAMSULOSIN SR 0.4 MG 24 HR CAP
0.4 mg | Freq: Every day | ORAL | Status: DC
Start: 2015-06-25 — End: 2015-06-27
  Administered 2015-06-25 – 2015-06-27 (×3): via ORAL

## 2015-06-25 MED ORDER — PANTOPRAZOLE 40 MG IV SOLR
40 mg | Freq: Two times a day (BID) | INTRAVENOUS | Status: DC
Start: 2015-06-25 — End: 2015-06-27
  Administered 2015-06-25 – 2015-06-27 (×4): via INTRAVENOUS

## 2015-06-25 MED ORDER — MINERAL OIL ENEMA
RECTAL | Status: AC
Start: 2015-06-25 — End: 2015-06-25
  Administered 2015-06-25: 22:00:00 via RECTAL

## 2015-06-25 MED ORDER — LACTULOSE 10 GRAM/15 ML ORAL SOLUTION
10 gram/15 mL | Freq: Three times a day (TID) | ORAL | Status: DC
Start: 2015-06-25 — End: 2015-06-27
  Administered 2015-06-26 – 2015-06-27 (×4): via ORAL

## 2015-06-25 MED ORDER — SODIUM CHLORIDE 0.9 % IJ SYRG
Freq: Three times a day (TID) | INTRAMUSCULAR | Status: DC
Start: 2015-06-25 — End: 2015-06-27
  Administered 2015-06-25 – 2015-06-27 (×6): via INTRAVENOUS

## 2015-06-25 MED ORDER — .PHARMACY TO SUBSTITUTE PER PROTOCOL
Status: DC | PRN
Start: 2015-06-25 — End: 2015-06-24

## 2015-06-25 MED ORDER — RANITIDINE 150 MG TAB
150 mg | Freq: Two times a day (BID) | ORAL | Status: DC | PRN
Start: 2015-06-25 — End: 2015-06-24

## 2015-06-25 MED ORDER — HYDROCHLOROTHIAZIDE 25 MG TAB
25 mg | Freq: Every day | ORAL | Status: DC
Start: 2015-06-25 — End: 2015-06-27
  Administered 2015-06-25 – 2015-06-27 (×3): via ORAL

## 2015-06-25 MED FILL — LACTULOSE 20 GRAM/30 ML ORAL SOLUTION: 20 gram/30 mL | ORAL | Qty: 30

## 2015-06-25 MED FILL — AMLODIPINE 5 MG TAB: 5 mg | ORAL | Qty: 1

## 2015-06-25 MED FILL — TAMSULOSIN SR 0.4 MG 24 HR CAP: 0.4 mg | ORAL | Qty: 1

## 2015-06-25 MED FILL — LOVASTATIN 20 MG TAB: 20 mg | ORAL | Qty: 2

## 2015-06-25 MED FILL — FLEET MINERAL OIL ENEMA: RECTAL | Qty: 133

## 2015-06-25 MED FILL — HYDROCHLOROTHIAZIDE 25 MG TAB: 25 mg | ORAL | Qty: 1

## 2015-06-25 MED FILL — PROTONIX 40 MG INTRAVENOUS SOLUTION: 40 mg | INTRAVENOUS | Qty: 40

## 2015-06-25 NOTE — Consults (Addendum)
[facility]  [address] [place]  [place], [place]  [phone]                     GI CONSULTATION NOTE  Elvera Lennox, AGACNP-BC  Work Cell: [id number]      NAME:  Damon Damon [name]   DOB:   [birth year]   MRN:   [id number]       Referring Provider: Dr. Army Melia    Consult Date: 06/25/2015     Chief Complaint: Bloating    History of Present Illness:  Damon [name] is a 64 y.o. who is seen in consultation at the request of Dr. Army Melia for GI bleed. Damon Damon [name] has PMH as detailed below including recent AAA repair with subsequent thrombectomy for pulseless leg post-operatively. He reports feeling bloated with difficulty passing his urine at home. He reports having intermittent abdominal cramping and a "LUQ spasm." He reports having "dark chocolate" colored stools post-operatively. He denies any black or bloody stools. Last BM was on Sunday. He has taken Motrin recently, but denies any regular NSAID use. He denies any nausea or vomiting. He does have occasional heartburn for which he takes Pepcid as needed. He denies any history of ulcer disease. No prior EGD or colonoscopy. Rectal exam in the ER revealed melenic heme positive stool.       PMH:  Past Medical History   Diagnosis Date   ??? 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 GERD (gastroesophageal reflux disease)    ??? HTN (hypertension)    ??? Hyperlipidemia        PSH:  Past Surgical History   Procedure Laterality Date   ??? Vascular surgery procedure unlist       ENdovas aneurysm repair       Allergies:  No Known Allergies    Home Medications:  Prior to Admission Medications   Prescriptions Last Dose Informant Damon [name] Reported? Taking?   amLODIPine (NORVASC) 5 mg tablet 06/24/2015 at 900  No Yes   Sig: TAKE ONE TABLET BY MOUTH ONCE DAILY FOR BLOOD PRESSURE    aspirin delayed-release 81 mg tablet 06/24/2015 at 900  Yes Yes   Sig: Take  by mouth daily.   lisinopril-hydrochlorothiazide (PRINZIDE, ZESTORETIC) 20-25 mg per tablet 06/24/2015 at 900  No Yes   Sig: Take 1 Tab by mouth daily.   lovastatin (MEVACOR) 40 mg tablet 06/23/2015 at 2200  No Yes   Sig: Take 1 Tab by mouth nightly.   ranitidine (ACID CONTROL, RANITIDINE,) 150 mg tablet 06/17/2015 at Unknown time  No Yes   Sig: Take 1 Tab by mouth two (2) times daily as needed for Indigestion.      Facility-Administered Medications: None       Hospital Medications:  Current Facility-Administered Medications   Medication Dose Route Frequency   ??? sodium chloride (NS) flush 5-10 mL  5-10 mL IntraVENous Q8H   ??? sodium chloride (NS) flush 5-10 mL  5-10 mL IntraVENous PRN   ??? sodium chloride (NS) flush 5-10 mL  5-10 mL IntraVENous Q8H   ??? sodium chloride (NS) flush 5-10 mL  5-10 mL IntraVENous PRN   ??? 0.9% sodium chloride infusion  75 mL/hr IntraVENous CONTINUOUS   ??? lactulose (CHRONULAC) solution 10 g  10 g Oral TID   ???  amLODIPine (NORVASC) tablet 5 mg  5 mg Oral DAILY   ??? lovastatin (MEVACOR) tablet 40 mg  40 mg Oral QHS   ??? famotidine (PEPCID) tablet 20 mg  20 mg Oral BID PRN   ??? lisinopril/hydrochlorothiazide(PRINZIDE/ZESTORETIC) 20/25 mg   Oral DAILY       Social History:  Social History   Substance Use Topics   ??? Smoking status: Current Every Day Smoker     Packs/day: 0.50     Years: 44.00     Types: Cigarettes     Start date: 12/11/1967   ??? Smokeless tobacco: Former Systems developer     Quit date: 05/22/2015      Comment: 1/2 pack cig. a day   ??? Alcohol use 0.0 oz/week     0 Standard drinks or equivalent per week      Comment: occasional       Family History:  Family History   Problem Relation Age of Onset   ??? Hypertension Mother    ??? Hypertension Sister    ??? Kidney Disease Sister    ??? Hypertension Brother    ??? Heart Disease Brother    ??? Kidney Disease Sister      on dialysis   ??? Heart Disease Sister    ??? Hypertension Sister     ??? Diabetes Father    ??? Heart Disease Father    ??? Hypertension Father    ??? Diabetes Paternal Uncle      deceased, 77's, heart trouble       Review of Systems:    Constitutional: negative fever, negative chills, negative weight loss  Eyes:   negative visual changes  ENT:   negative sore throat, tongue or lip swelling  Respiratory:  negative cough, negative dyspnea  Cards:  negative for chest pain, palpitations, lower extremity edema  GI:   See HPI  GU:  negative for frequency, dysuria  Integument:  negative for rash and pruritus  Heme:  negative for easy bruising and gum/nose bleeding  Musculoskel: negative for myalgias, back pain and muscle weakness  Neuro: negative for headaches, dizziness, vertigo  Psych:  negative for feelings of anxiety, depression      Objective:   Damon [name] Vitals for the past 8 hrs:   BP Temp Pulse Resp SpO2   06/25/15 0815 141/69 98.1 ??F (36.7 ??C) (!) Damon 14 98 %     10/04 0701 - 10/04 1900  In: 240 [P.O.:240]  Out: -   10/02 1901 - 10/04 0700  In: -   Out: 3080 [Urine:3080]      PHYSICAL EXAM:  General: WD, WN. Alert, cooperative, no acute distress.????  HEENT: NC, Atraumatic. PERRLA, EOMI. Anicteric sclerae.  Lungs:  CTA Bilaterally. No Wheezing/Rhonchi/Rales.  Heart:  Regular rate and rhythm,??No murmur, No Rubs, No Gallops  Abdomen: Soft, distended, non-tender. ??+Bowel sounds, no HSM  Extremities: No c/c/e  Neurologic:?? Alert and oriented X 3.  No acute neurological distress.   Psych:???? Good insight.??Not anxious nor agitated.    Data Review     Recent Labs      06/25/15   1212  06/25/15   0502   06/24/15   1435   WBC   --    --    --   10.4   HGB  11.2*  11.9*   < >  13.7   HCT  33.3*  34.7*   < >  40.4   PLT   --    --    --  359    < > = values in Damon interval not displayed.     Recent Labs      06/25/15   0502  06/24/15   2256   NA  136  137   K  4.0  4.1   CL  102  101   CO2  25  26   BUN  26*  32*   CREA  1.08  1.21   GLU  105*  118*   CA  8.4*  8.5     Recent Labs      06/25/15    0502  06/24/15   1435   SGOT  14*  32   AP  79  93   TP  6.6  7.9   ALB  2.7*  3.3*   GLOB  3.9  4.6*     Recent Labs      06/24/15   2025   INR  1.0   PTP  10.3        Imaging studies reviewed      Assessment:   Damon [name] is an 64 y.o. Damon [name] s/p recent AAA repair and thrombectomy who presents with dark, heme positive stool and anemia. Differentials include PUD, erosions, gastritis, AVMs, GI tract neoplasms, etc.     Damon [name] Active Problem List   Diagnosis Code   ??? Hypertension I10   ??? GERD (gastroesophageal reflux disease) K21.9   ??? Hyperlipidemia E78.5   ??? Abdominal aortic aneurysm without rupture ([place]) I71.4            Plan:   -Clear liquid diet  -Start PPI BID  -Check KUB  -Plan for possible EGD tomorrow  -Monitor H&H and follow clinical course for any signs of overt bleeding  -Transfuse as necessary   -Discussed with Dr. Dwyane Dee  -Will follow along with you  -Thank you kindly for allowing [place] to participate in the care of Damon Damon [name]    I have examined the Damon [name].  I have reviewed the chart and agree with the documentation recorded by the NP, including the assessment, treatment plan, and disposition.    ASSESSMENT AND PLAN:  Damon Damon [name] has presented with melena and abdominal distension and constipation  1 week after an Endograft placement and thrombectomy for AAA. Differential diagnosis includes PUD, gastritis, esophagitis, Ileus, bowel obstruction,etc.  NPO  EGD tomorrow.  IV PPI  Increase Lactulose to 30 ml PO TID  Fleets enema  Follow serial hemoglobins and transfuse if needed.We will follow with you.    Inez Pilgrim, MD

## 2015-06-25 NOTE — Progress Notes (Signed)
Hospitalist Progress Note  Burnadette Peter, MD  Office: [phone]      Date of Service:  06/25/2015  NAME:  Damon [name]  DOB:  [birth year]  MRN:  [id number]      Chief Complaints:    abd pain off and on persists. Non radiating sharp. No nausea or vomiting    Admission Summary:   64 year old gentleman with past medical history of hypertension, hyperlipidemia and a history of AAA with recent repair of AAA via femoral artery approach and stenting who presents to the hospital with abdominal pain.    Interval history / Subjective:     No nausea or vomiting     Assessment & Plan:     Abdominal pain  -sec to ? Recent procedure vs constipation vs urinary retention. I think urinary retention.  -CT abd/pelvis 10/3 shows aortobifem endograft without evidence of hemorrhage. Presumed hematoma in right inguinal region. Markedly distended bladder. Right inguinal hematoma  -Still having abd pain. KUB ordered by GI  -Spoke to Dr Kathrin Penner.    AAA  -recent endograft  -Spoke to Dr Kathrin Penner    Melena/GI bleed  -Awaiting GI input  -H/h dropping   -Spoke to Gi, EGD tomorrow.  -Protonix added  -Monitor H/h. Transfuse as needed    Urinary retention  -Now s/p foley  -Awaiting Urology    Mild renal dysfunction  -sec to above  -US renal ??? unremarkable  -Now resolved    HTN  Stable    Constipation  -Patient claims usual for him to be constipated  -Patient put on lactulose    Clear liquids, NPO from midnight    Code status: Full code  DVT prophylaxis: Nellie discussed with: Patient/Family  Disposition: TBD     Hospital Problems  Date Reviewed: 06/18/2015    None            Review of Systems:   Pertinent items are noted in HPI.       Vital Signs:    Last 24hrs VS reviewed since prior progress note. Most recent are:  Visit Vitals   ??? BP 141/69 (BP 1 Location: Right arm, BP Patient Position: At rest)   ??? Pulse (!) 58   ??? Temp 98.1 ??F (36.7 ??C)   ??? Resp 14    ??? Ht 5\' 10"  (1.778 m)   ??? Wt 80.3 kg (177 lb 2 oz)   ??? SpO2 98%   ??? BMI 25.41 kg/m2         Intake/Output Summary (Last 24 hours) at 06/25/15 1309  Last data filed at 06/25/15 1308   Gross per 24 hour   Intake    480 ml   Output   3080 ml   Net  -2600 ml        Physical Examination:      **Comprehensive Exam must document 8 or more systems**        Constitutional:  No acute distress, cooperative, pleasant??   ENT:  Oral mucous moist, oropharynx benign. Neck supple,    Resp:  CTA bilaterally. No wheezing/rhonchi/rales. No accessory muscle use   CV:  Regular rhythm, normal rate, no murmurs, gallops, rubs    GI:  Soft, non distended, non tender (now). normoactive bowel sounds, no hepatosplenomegaly     Musculoskeletal:  No edema, warm, 2+ pulses throughout    Neurologic:  Moves all extremities.  AAOx3, CN II-XII reviewed     Skin:  Good turgor, no rashes or  ulcers     Data Review:    Review and/or order of clinical lab test      Labs:     Recent Labs      06/25/15   1212  06/25/15   0502   06/24/15   1435   WBC   --    --    --   10.4   HGB  11.2*  11.9*   < >  13.7   HCT  33.3*  34.7*   < >  40.4   PLT   --    --    --   359    < > = values in this interval not displayed.     Recent Labs      06/25/15   0502  06/24/15   2256  06/24/15   1435   NA  136  137  133*   K  4.0  4.1  4.3   CL  102  101  99   CO2  25  26  23    BUN  26*  32*  40*   CREA  1.08  1.21  1.34*   GLU  105*  118*  115*   CA  8.4*  8.5  9.7     Recent Labs      06/25/15   0502  06/24/15   1435   SGOT  14*  32   ALT  23  33   AP  79  93   TBILI  0.9  0.7   TP  6.6  7.9   ALB  2.7*  3.3*   GLOB  3.9  4.6*     Recent Labs      06/24/15   2025   INR  1.0   PTP  10.3      No results for input(s): FE, TIBC, PSAT, FERR in the last 72 hours.   No results found for: FOL, RBCF   No results for input(s): PH, PCO2, PO2 in the last 72 hours.  No results for input(s): CPK, CKNDX, TROIQ in the last 72 hours.    No lab exists for component: CPKMB  Lab Results    Component Value Date/Time    CHOLESTEROL, TOTAL 153 02/15/2015 01:02 PM    HDL CHOLESTEROL 36 02/15/2015 01:02 PM    LDL, CALCULATED 100 02/15/2015 01:02 PM    TRIGLYCERIDE 84 02/15/2015 01:02 PM    CHOL/HDL RATIO 4.0 11/01/2013 11:39 AM     Lab Results   Component Value Date/Time    GLUCOSE POC 125 nonfasting 11/16/2012 10:12 AM     Lab Results   Component Value Date/Time    COLOR YELLOW/STRAW 06/24/2015 03:29 PM    APPEARANCE CLEAR 06/24/2015 03:29 PM    SPECIFIC GRAVITY 1.020 06/24/2015 03:29 PM    PH (UA) 6.5 06/24/2015 03:29 PM    PROTEIN NEGATIVE  06/24/2015 03:29 PM    GLUCOSE NEGATIVE  06/24/2015 03:29 PM    KETONE TRACE 06/24/2015 03:29 PM    BILIRUBIN NEGATIVE  06/24/2015 03:29 PM    UROBILINOGEN 0.2 06/24/2015 03:29 PM    NITRITES NEGATIVE  06/24/2015 03:29 PM    LEUKOCYTE ESTERASE NEGATIVE  06/24/2015 03:29 PM    EPITHELIAL CELLS FEW 06/24/2015 03:29 PM    BACTERIA NEGATIVE  06/24/2015 03:29 PM    WBC 0-4 06/24/2015 03:29 PM    RBC 5-10 06/24/2015 03:29 PM         ______________________________________________________________________  EXPECTED  LENGTH OF STAY:  2 days               Burnadette Peter, MD

## 2015-06-25 NOTE — Progress Notes (Signed)
Bedside shift change report given to Cary ,RN (oncoming nurse) by Devon Jones, RN(offgoing nurse).  Report given with SBAR.

## 2015-06-25 NOTE — Progress Notes (Signed)
Spiritual Care Assessment/Progress Notes    Damon [name] [id number]  [id number]    Nov 22, 1950  64 y.o.  male    Patient Telephone Number: There is no home phone number on file.   Religious Affiliation: No religion   Language: English   Extended Emergency Contact Information  Primary Emergency Contact: [place] Phone: [phone]  Mobile Phone: [phone]  Relation: Child   Patient Active Problem List    Diagnosis Date Noted   ??? Abdominal aortic aneurysm without rupture ([place]) 06/18/2015   ??? Hyperlipidemia    ??? Hypertension 11/04/2011   ??? GERD (gastroesophageal reflux disease) 11/04/2011        Date: 06/25/2015       Level of Religious/Spiritual Activity:           Involved in faith tradition/spiritual practice             Not involved in faith tradition/spiritual practice           Spiritually oriented             Claims no spiritual orientation             seeking spiritual identity           Feels alienated from religious practice/tradition           Feels angry about religious practice/tradition           Spirituality/religious tradition is a Theatre manager for coping at this time.           Not able to assess due to medical condition    Services Provided Today:           crisis intervention             reading Scriptures           spiritual assessment             prayer           empathic listening/emotional support           rites and rituals (cite in comments)           life review              religious support           theological development            advocacy           ethical dialog              blessing           bereavement support             support to family           anticipatory grief support            help with AMD           spiritual guidance             meditation      Spiritual Care Needs           Emotional Support           Spiritual/Religious Care           Loss/Adjustment           Advocacy/Referral                Benard Rink  No needs expressed at               this time           Other: (note in               comments)  Spiritual Care Plan           Follow up visits with               pt/family           Provide materials           Schedule sacraments           Contact Community               Clergy           Follow up as needed           Other: (note in               comments)     Comments:  Responded to request from patient that he was interested in completing an Advance Medical Directive. Mr. Hovater was resting at the time of the attempted visit and it did not appear to be a good time. I assured him of availability and left form with patient.  Spiritual care is available to follow up with patient at a later time to complete Advance Directive should patient so desire. Please page [phone] as needed.     Visit by: Rev. Fuquan Wilson L. Dockum, D.Min, MA, [place]

## 2015-06-25 NOTE — Progress Notes (Signed)
Bedside and Verbal shift change report given to Sarah, RN (oncoming nurse) by Cary, RN(offgoing nurse). Report included the following information SBAR, Kardex and MAR.

## 2015-06-25 NOTE — Progress Notes (Addendum)
The patient admitted with GI bleed and abd pain.  H/H continues to fall, will continue to keep the patient admitted for EGD tomorrow  The patient would have atleast 2 night stay.  Will change the patient from observation to inpatient.

## 2015-06-25 NOTE — Progress Notes (Signed)
Bedside and Verbal shift change report given to Erin Gervasoni, RN (oncoming nurse) by Sarah Nash, RN (offgoing nurse). Report included the following information SBAR.

## 2015-06-25 NOTE — Consults (Signed)
[place] [address]   [place]       Name:  Damon, [name]   MR#:  [id number]   DOB:  [birth year]   Account #:  [id number]    Date of Consultation:  06/25/2015   Date of Adm:  06/24/2015       REFERRING PHYSICIAN:   Burnadette Peter, MD    REASON FOR EVALUATION.   1. BPH with obstruction.   2. Urinary retention.    HISTORY OF PRESENT ILLNESS: The patient is a 64 year old black   male with a history of hypertension, hyperlipidemia, coronary artery   disease, who underwent a recent AAA repair last week. He was   discharged home and presented to the emergency room with nausea,   vomiting and inability to urinate or have a bowel movement. The Foley   catheter was placed with return of significant amounts of urine. Urology   was consulted.     PAST MEDICAL HISTORY: Significant for hypertension,   hyperlipidemia, abdominal aortic aneurysm, which was recently   repaired. He stated that prior to his AAA repair he was voiding well.    He denied any slowing stream. He denied any nocturia. He denied any   family history of GU malignancy.      SURGICAL HISTORY: Significant for AAA repair.    MEDICATIONS AT TIME OF ADMISSION INCLUDE   1. Amlodipine 5 mg daily.   2. Lisinopril/hydrochlorothiazide 20/25 mg daily.   3. Lovastatin 20 mg daily.   4. Ranitidine 100 mg p.o. b.i.d.   5. Aspirin 81 mg daily.    FAMILY HISTORY: Noncontributory for GU malignancy.    SOCIAL HISTORY: He is a former smoker.    ALLERGIES: HE HAS NO KNOWN ALLERGIES.    REVIEW OF SYSTEMS   As stated.    PHYSICAL EXAMINATION   GENERAL: Reveals a black male who appears his stated age.   NEUROLOGIC: He is alert and oriented x3.   HEENT: Normocephalic, atraumatic. Extraocular muscles are intact.    There is no jugular venous distention. Sclerae are anicteric.   VITAL SIGNS: Temperature is 98.1, blood pressure is 143/82, pulse is   62.   CARDIOVASCULAR: Regular rate.   PULMONARY: Clear.    ABDOMEN: Soft, distended, nontender. Tympanitic.   GENITOURINARY: Reveals an uncircumcised phallus with Foley   catheter in place. Testes descended bilaterally, symmetrical.   SKIN: Warm.   NEUROLOGIC: Cranial nerves are grossly intact.   EXTREMITIES: Demonstrate no clubbing, cyanosis or edema.    FURTHER LABORATORY VALUES, CHEMISTRY:  Sodium was 136,   potassium 4.0, chloride 102, bicarbonate 25, BUN 26, creatinine 1.08.    IMPRESSION   1. Benign prostatic hypertrophy with obstruction.   2. Urinary retention.    PLAN: I had a discussion with the patient and his daughter was   present. I explained to him that we probably need to discharge him   with his Foley catheter in place. We can see about discharging him   and have him follow up next week for a void trial. We will go ahead and   start Tamsulosin 0.4 mg capsules. I explained to him the proper use   and side effects of the medication. If he fails his voiding trial, we will go   ahead and reinsert the catheter, give him a longer trial of medication   and have him back in several weeks for a repeat void trial.  Babs Sciara, MD      JD / DV   D:  06/25/2015   16:36   T:  06/25/2015   20:04   Job #:  [id number]

## 2015-06-25 NOTE — ACP (Advance Care Planning) (Signed)
Responded to request from patient that he was interested in completing an Advance Medical Directive. Damon [name] was resting at the time of the attempted visit and it did not appear to be a good time. I assured him of availability and left form with patient.  Spiritual care is available to follow up with patient at a later time to complete Advance Directive should patient so desire. Please page [phone] as needed.     Visit by: Rev. Gunther Zawadzki L. Dockum, D.Min, MA, [place]

## 2015-06-25 NOTE — Progress Notes (Signed)
Care Management Interventions  PCP Verified by CM: Yes (Dr. Fredonia Highland)  Palliative Care Consult: No  Mode of Transport at Discharge: Self  Transition of Care Consult (CM Consult): Discharge Planning (follow as needed)  Discharge Durable Medical Equipment: No  Physical Therapy Consult: No  Occupational Therapy Consult: No  Speech Therapy Consult: No  Current Support Network: Relative's Home (Lives with the daughter)  Confirm Follow Up Transport: Family  Freedom of Choice Offered: Yes  Discharge Location  Discharge Placement: Home    Chart reviewed. CRM spoke with the patient during walking rounds. The patient lives independently with his daughter. There are no discharge needs identified. Will follow as needed.  KJT

## 2015-06-25 NOTE — H&P (Signed)
[place] [address]   HISTORY AND PHYSICAL       Name:  Damon [name], Damon [name]   MR#:  [id number]   DOB:  [birth year]   Account #:  [id number]        Date of Adm:  06/24/2015       ATTENDING PHYSICIAN: Toy Baker, MD    CHIEF COMPLAINT: Abdominal pain.    HISTORY OF PRESENT ILLNESS: The patient is a 64 year old   gentleman with past medical history of hypertension, hyperlipidemia   and a history of AAA with recent repair of AAA via femoral artery   approach and stenting who presents to the hospital with the above   mentioned symptoms. Patient apparently reports that he had a AAA   repair done at this hospital about a week back. Since then, was unable   to have a bowel movement, but had a bowel movement yesterday. He   felt the stool was dark and had melena. He also had some abdominal   pain and felt bloated and got concerned and decided to come the   hospital. Patient denies any vomiting at all. Reports that he has been   eating, but not as much as he used to. Patient denies any frank   bleeding. He also denies any fever or chills. Also denies any headache,   blurry vision, sore throat, trouble swallowing, trouble with speech, any   chest pain, shortness of breath, cough, focal or generalized   neurological weakness, recent travel or sick contact. His brother   reports that it has been increasingly hard for him to urinate and he felt   that was pressure in his bladder.    PAST MEDICAL HISTORY: See above.    HOME MEDICATIONS   Currently the patient is on:   1. Amlodipine 5 mg daily.   2. Lisinopril/hydrochlorothiazide 20/25 mg daily.   3. Lovastatin 20 mg daily.   4. Ranitidine 150 mg  mg b.i.d. p.r.n.   5. Aspirin 81 mg daily.    SOCIAL HISTORY: The patient is a former smoker, smoked about a   pack per day for about 30 years, recently quit before   surgery. Occasional alcohol. Denies IV drug abuse. Lives at home.     FAMILY HISTORY: Mother has history of coronary artery disease.    REVIEW OF SYSTEMS: A 10-point of systems was done, essentially   negative except as symptoms mentioned above.    ALLERGIES: NO KNOWN DRUG ALLERGIES.     PHYSICAL EXAMINATION   VITALS: Temperature 98.6, pulse 72, respiratory rate 16, blood   pressure 128/66, pulse oximetry 95% on room air.   GENERAL: Alert, oriented x3, awake, no acute distress.  Resting in   bed. Pleasant male. Appears stated age.   HEENT: Atraumatic, normocephalic. Pupils equally reacted to light.   NECK: Supple.   CHEST: Clear to auscultation bilaterally.   COR: S1, S2 were heard.   ABDOMEN: Soft, mildly distended, tender to palpation in the pelvis, no   rebound, mild guarding.   EXTREMITIES: No clubbing, no cyanosis or edema.   NEUROPSYCHIATRIC: Pleasant mood and affect. Sensory grossly   within normal limits. DTRs 1+, moves all 4. Strength 5/5/5.   SKIN: Warm.    LABS: White count 10.4, hemoglobin 13.7, hematocrit 40.4, platelets   359. Urine shows no signs of infection. Sodium 133, potassium 4.3,   chloride 99, bicarbonate 23, glucose 115, BUN 40, creatinine 1.3,  calcium 9.7, bilirubin 0.7, albumin 3.3, ALT 23, AST 32.      CT of the abdomen and pelvis shows aorto-bifemoral endograft without   evidence of leakage.  Presumed hematoma in the right inguinal region.   Markedly distended bladder.  Prostate enlargement.  Hepatic   hemangioma.    ASSESSMENT AND PLAN   1. Abdominal pain. Appears to be multifactorial, possibly constipation   with some urinary irritation and prostatomegaly. Patient will be   observed on a medical bed. Patient also has probably gastrointestinal   bleed, although hemoglobin and hematocrit is stable. Will check   hemoglobin and hematocrit q.6 hours. Will keep the patient on clear   liquid diet. Gastrointestinal consult in the morning. CT scan does not   show any acute obstruction at this point of time. Will start patient on    some lactulose for laxative purposes and continue to closely monitor.   Further intervention will be per hospital course, and will reassess as   needed. Will monitor hemoglobin and hematocrit every 6 hours and   may consider colonoscopy in conjunction with Gastroenterology.   2. Urinary retention, most likely secondary to prostatomegaly and   possible post surgical neurogenic bladder, Foley was placed. Patient   reports that his symptoms have much improved post Foley. Will get a   urology consult. Will also get a PSA level and continue to closely   monitor.   3. Hypertension. Continue home medications and continue to monitor.   4. Gastrointestinal bleed. See above. Will get hemoglobin and hematocrit   q.6 hours, monitor patient on a medical bed. Will get a gastrointestinal   consult and patient took his aspirin today, will hold until in the morning   and may reconsider starting aspirin in the morning if stable. Further   intervention will be per hospital course.   5. Gastrointestinal/deep venous thrombosis prophylaxis. Patient will be   on mechanical sequential compression devices.        Toy Baker, MD      MM / MP   D:  06/24/2015   20:07   T:  06/25/2015   01:51   Job #:  [id number]

## 2015-06-25 NOTE — Progress Notes (Signed)
Vascular  64 y/o gentleman s/p a percutaneous EVAR last week readmitted with urinary retention and lower abdominal pain. On exam he is distended without tenderness. He is heme positive. His CT suggested a "very large" distended bladder and he may be having some pain related to his overdistention and urinary retention. GI is evaluating his colon. We will follow with you. Consult will be dictated later today or in the am. Thanks.

## 2015-06-26 LAB — POC H. PYLORI, TISSUE
H. pylori from tissue: POSITIVE
Lot no.: 144076
Negative control: NEGATIVE
Positive control: POSITIVE

## 2015-06-26 LAB — METABOLIC PANEL, BASIC
Anion gap: 8 mmol/L (ref 5–15)
BUN/Creatinine ratio: 14 (ref 12–20)
BUN: 13 MG/DL (ref 6–20)
CO2: 24 mmol/L (ref 21–32)
Calcium: 8.1 MG/DL — ABNORMAL LOW (ref 8.5–10.1)
Chloride: 102 mmol/L (ref 97–108)
Creatinine: 0.93 MG/DL (ref 0.70–1.30)
GFR est AA: >60 mL/min/{1.73_m2} (ref >60)
GFR est non-AA: >60 mL/min/{1.73_m2} (ref >60)
Glucose: 86 mg/dL (ref 65–100)
Potassium: 3.8 mmol/L (ref 3.5–5.1)
Sodium: 134 mmol/L — ABNORMAL LOW (ref 136–145)

## 2015-06-26 LAB — HGB & HCT
HCT: 33 % — ABNORMAL LOW (ref 36.6–50.3)
HCT: 31.3 % — ABNORMAL LOW (ref 36.6–50.3)
HGB: 11 g/dL — ABNORMAL LOW (ref 12.1–17.0)
HGB: 10.6 g/dL — ABNORMAL LOW (ref 12.1–17.0)

## 2015-06-26 LAB — PSA SCREENING (SCREENING)
Prostate Specific Ag: 3.5 ng/mL (ref 0.0–4.0)
Prostate Specific Ag: 4.3 ng/mL — ABNORMAL HIGH (ref 0.0–4.0)

## 2015-06-26 MED ORDER — SODIUM CHLORIDE 0.9 % IV
INTRAVENOUS | Status: DC | PRN
Start: 2015-06-26 — End: 2015-06-26
  Administered 2015-06-26 (×2): via INTRAVENOUS

## 2015-06-26 MED ORDER — PHENYLEPHRINE IN 0.9 % SODIUM CL (40 MCG/ML) IV SYRINGE
0.4 mg/10 mL (40 mcg/mL) | INTRAVENOUS | Status: DC | PRN
Start: 2015-06-26 — End: 2015-06-26
  Administered 2015-06-26 (×3): via INTRAVENOUS

## 2015-06-26 MED ORDER — ACETAMINOPHEN 325 MG TABLET
325 mg | Freq: Four times a day (QID) | ORAL | Status: DC | PRN
Start: 2015-06-26 — End: 2015-06-27
  Administered 2015-06-26 – 2015-06-27 (×3): via ORAL

## 2015-06-26 MED ORDER — LIDOCAINE (PF) 20 MG/ML (2 %) IJ SOLN
20 mg/mL (2 %) | INTRAMUSCULAR | Status: DC | PRN
Start: 2015-06-26 — End: 2015-06-26
  Administered 2015-06-26: 18:00:00 via INTRAVENOUS

## 2015-06-26 MED ORDER — PROPOFOL 10 MG/ML IV EMUL
10 mg/mL | INTRAVENOUS | Status: DC | PRN
Start: 2015-06-26 — End: 2015-06-26
  Administered 2015-06-26 (×5): via INTRAVENOUS

## 2015-06-26 MED FILL — BD POSIFLUSH NORMAL SALINE 0.9 % INJECTION SYRINGE: INTRAMUSCULAR | Qty: 10

## 2015-06-26 MED FILL — DIPRIVAN 10 MG/ML INTRAVENOUS EMULSION: 10 mg/mL | INTRAVENOUS | Qty: 20

## 2015-06-26 MED FILL — LACTULOSE 20 GRAM/30 ML ORAL SOLUTION: 20 gram/30 mL | ORAL | Qty: 30

## 2015-06-26 MED FILL — XYLOCAINE-MPF 20 MG/ML (2 %) INJECTION SOLUTION: 20 mg/mL (2 %) | INTRAMUSCULAR | Qty: 1

## 2015-06-26 MED FILL — AMLODIPINE 5 MG TAB: 5 mg | ORAL | Qty: 1

## 2015-06-26 MED FILL — SODIUM CHLORIDE 0.9 % IV: INTRAVENOUS | Qty: 500

## 2015-06-26 MED FILL — ACETAMINOPHEN 325 MG TABLET: 325 mg | ORAL | Qty: 2

## 2015-06-26 MED FILL — PROTONIX 40 MG INTRAVENOUS SOLUTION: 40 mg | INTRAVENOUS | Qty: 40

## 2015-06-26 MED FILL — PHENYLEPHRINE IN 0.9 % SODIUM CL (40 MCG/ML) IV SYRINGE: 0.4 mg/10 mL (40 mcg/mL) | INTRAVENOUS | Qty: 160

## 2015-06-26 MED FILL — TAMSULOSIN SR 0.4 MG 24 HR CAP: 0.4 mg | ORAL | Qty: 1

## 2015-06-26 MED FILL — LOVASTATIN 20 MG TAB: 20 mg | ORAL | Qty: 2

## 2015-06-26 MED FILL — HYDROCHLOROTHIAZIDE 25 MG TAB: 25 mg | ORAL | Qty: 1

## 2015-06-26 NOTE — Progress Notes (Signed)
Patient has arrived back on unit; took vital signs, did nursing reassessment

## 2015-06-26 NOTE — Progress Notes (Signed)
Bedside and Verbal shift change report given to ward,rn (oncoming nurse) by rose, rn (offgoing nurse). Report included the following information SBAR.

## 2015-06-26 NOTE — Progress Notes (Signed)
Bedside shift change report given to Einar Pheasant (Soil scientist) by Eulas Post (offgoing nurse). Report included the following information SBAR, Kardex, Accordion and Recent Results.

## 2015-06-26 NOTE — Progress Notes (Signed)
Bedside and Verbal shift change report given to Eulas Post, Therapist, sports (Soil scientist) by Jeani Hawking, Printmaker). Report included the following information SBAR, Kardex and MAR.

## 2015-06-26 NOTE — Progress Notes (Signed)
DOYNE MICKE  [birth year]  [id number]          Situation:  Verbal report received from: Butcher,rn  Preoperative diagnosis: Anemia  Patient stated .    Background:  Procedure: Procedure(s):  ESOPHAGOGASTRODUODENOSCOPY (EGD)  Physician performing procedure; Dr. Dwyane Dee    Patient diabetic no   Patient taking blood thinners no   Patient has a defibrillator: no   Patient needs antibiotics: no     Assessment:  Vital signs stable- yes  Alert and oriented - yes  Abdominal assessment: round and soft -yes      Recommendation:  Endoscopic Procedure  Family or Friend- no  Permission to share finding with Family or Friend yes

## 2015-06-26 NOTE — Progress Notes (Signed)
Hospitalist Progress Note  Burnadette Peter, MD  Office: [phone]      Date of Service:  06/26/2015  NAME:  Damon [name]  DOB:  1951/02/01  MRN:  [id number]      Chief Complaints:    abd "cramp like" pain off and on persists. Non radiating sharp. No nausea or vomiting    Admission Summary:   64 year old gentleman with past medical history of hypertension, hyperlipidemia and a history of AAA with recent repair of AAA via femoral artery approach and stenting who presents to the hospital with abdominal pain.    Interval history / Subjective:     No nausea or vomiting. Had a bowel movement at night     Assessment & Plan:     Abdominal pain  -sec to ? Recent procedure vs constipation vs urinary retention. I think urinary retention.  -CT abd/pelvis 10/3 shows aortobifem endograft without evidence of hemorrhage. Presumed hematoma in right inguinal region. Markedly distended bladder. Right inguinal hematoma  -Appreciate Dr Kathrin Penner.  -Abd pain better after BM but still has some pain    AAA  -recent endograft  -Spoke to Dr Kathrin Penner    Melena/GI bleed  -Awaiting GI input  -H/h dropping   -Spoke to Gi, EGD today.  -Protonix added  -Monitor H/h. Transfuse as needed, no need for transfusion now    Urinary retention  -Now s/p foley  -Awaiting Urology    Mild renal dysfunction  -sec to above  -US renal ??? unremarkable  -Now resolved    HTN  Stable    Constipation  -Patient claims usual for him to be constipated  -Patient put on lactulose    NPO for now, restart diet after procedure based on results    Code status: Full code  DVT prophylaxis: [place] discussed with: Patient/Family  Disposition: TBD     Hospital Problems  Date Reviewed: Jul 01, 2015          Codes Class Noted POA    GI bleed ICD-10-CM: K92.2  ICD-9-CM: 578.9  06/25/2015 Unknown                Review of Systems:   Pertinent items are noted in HPI.       Vital Signs:     Last 24hrs VS reviewed since prior progress note. Most recent are:  Visit Vitals   ??? BP 124/77   ??? Pulse 60   ??? Temp 97.4 ??F (36.3 ??C)   ??? Resp 16   ??? Ht 5\' 10"  (1.778 m)   ??? Wt 80.3 kg (177 lb 2 oz)   ??? SpO2 96%   ??? BMI 25.41 kg/m2         Intake/Output Summary (Last 24 hours) at 06/26/15 0954  Last data filed at 06/26/15 0725   Gross per 24 hour   Intake   2445 ml   Output   2375 ml   Net     70 ml        Physical Examination:      **Comprehensive Exam must document 8 or more systems**        Constitutional:  No acute distress, cooperative, pleasant??   ENT:  Oral mucous moist, oropharynx benign. Neck supple,    Resp:  CTA bilaterally. No wheezing/rhonchi/rales. No accessory muscle use   CV:  Regular rhythm, normal rate, no murmurs, gallops, rubs    GI:  Soft, non distended, non tender (now). normoactive bowel sounds, no hepatosplenomegaly  Musculoskeletal:  No edema, warm, 2+ pulses throughout    Neurologic:  Moves all extremities.  AAOx3, CN II-XII reviewed     Skin:  Good turgor, no rashes or ulcers     Data Review:    Review and/or order of clinical lab test      Labs:     Recent Labs      06/26/15   0604  06/26/15   0029   06/24/15   1435   WBC   --    --    --   10.4   HGB  10.6*  11.0*   < >  13.7   HCT  31.3*  33.0*   < >  40.4   PLT   --    --    --   359    < > = values in this interval not displayed.     Recent Labs      06/26/15   0604  06/25/15   0502  06/24/15   2256   NA  134*  136  137   K  3.8  4.0  4.1   CL  102  102  101   CO2  24  25  26    BUN  13  26*  32*   CREA  0.93  1.08  1.21   GLU  86  105*  118*   CA  8.1*  8.4*  8.5     Recent Labs      06/25/15   0502  06/24/15   1435   SGOT  14*  32   ALT  23  33   AP  79  93   TBILI  0.9  0.7   TP  6.6  7.9   ALB  2.7*  3.3*   GLOB  3.9  4.6*     Recent Labs      06/24/15   2025   INR  1.0   PTP  10.3      No results for input(s): FE, TIBC, PSAT, FERR in the last 72 hours.   No results found for: FOL, RBCF    No results for input(s): PH, PCO2, PO2 in the last 72 hours.  No results for input(s): CPK, CKNDX, TROIQ in the last 72 hours.    No lab exists for component: CPKMB  Lab Results   Component Value Date/Time    CHOLESTEROL, TOTAL 153 02/15/2015 01:02 PM    HDL CHOLESTEROL 36 02/15/2015 01:02 PM    LDL, CALCULATED 100 02/15/2015 01:02 PM    TRIGLYCERIDE 84 02/15/2015 01:02 PM    CHOL/HDL RATIO 4.0 11/01/2013 11:39 AM     Lab Results   Component Value Date/Time    GLUCOSE POC 125 nonfasting 11/16/2012 10:12 AM     Lab Results   Component Value Date/Time    COLOR YELLOW/STRAW 06/24/2015 03:29 PM    APPEARANCE CLEAR 06/24/2015 03:29 PM    SPECIFIC GRAVITY 1.020 06/24/2015 03:29 PM    PH (UA) 6.5 06/24/2015 03:29 PM    PROTEIN NEGATIVE  06/24/2015 03:29 PM    GLUCOSE NEGATIVE  06/24/2015 03:29 PM    KETONE TRACE 06/24/2015 03:29 PM    BILIRUBIN NEGATIVE  06/24/2015 03:29 PM    UROBILINOGEN 0.2 06/24/2015 03:29 PM    NITRITES NEGATIVE  06/24/2015 03:29 PM    LEUKOCYTE ESTERASE NEGATIVE  06/24/2015 03:29 PM    EPITHELIAL CELLS FEW 06/24/2015 03:29 PM    BACTERIA  NEGATIVE  06/24/2015 03:29 PM    WBC 0-4 06/24/2015 03:29 PM    RBC 5-10 06/24/2015 03:29 PM         ______________________________________________________________________  EXPECTED LENGTH OF STAY:  2 days               Burnadette Peter, MD

## 2015-06-26 NOTE — Consults (Signed)
[place] [address]   [place]       Name:  Damon [name], Damon [name]   MR#:  [id number]   DOB:  1950-11-23   Account #:  [id number]    Date of Consultation:  06/25/2015   Date of Adm:  06/24/2015       HISTORY OF PRESENT ILLNESS: Briefly, this is a very pleasant 64-  year-old gentleman admitted on 06/25/2015 with lower abdominal pain   and some melena. The patient last week underwent a percutaneous   endovascular aneurysm repair of a 5.3 cm infrarenal aneurysm.   Postoperatively, he was doing well and was going home the next day   when he had a pulseless right leg with no symptoms. We did a femoral   exploration of his percutaneous closure site. There was thrombus   which we thrombectomized and then closed the artery transversely   postoperatively. He was then sent home the following day. He was   doing well. His Foley had been discontinued. He voided. He was sent   home, but the patient had difficulty at home voiding and developed   some urinary retention and came back to the hospital, also with lower   abdominal pain. Foley catheter was placed with a large amount of   urine, which I cannot see documented. He also since he was admitted   has had a couple of bowel movements. He has been seen by GI for   some heme-positive stool and an upper endoscopy is planned today.    PAST MEDICAL HISTORY: Significant for a former smoker. He has a   history of hypercholesterolemia, hypertension and abdominal aortic   aneurysm. 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    SOCIAL HISTORY: He was a heavy smoker in the past.    MEDICATIONS INCLUDE   1. Amlodipine.   2. Lisinopril/hydrochlorothiazide.   3. Lovastatin.   4. Ranitidine.   5. Aspirin.    ALLERGIES: HE HAS NO ALLERGIES.    FAMILY HISTORY: Significant for vascular disease, hypertension but   no evidence of aneurysm.    REVIEW OF SYSTEMS   CARDIAC: No chest pain.   RESPIRATORY: No cough.    GENITOURINARY: He had difficulty urinating and now has a Foley   catheter in place.   PSYCHIATRIC: No mood disorder or depression.   NEUROLOGIC: No strokes, seizures, TIA.   CARDIOVASCULAR: No chest pain, but the patient does have an   aneurysm, which has been treated.    PHYSICAL EXAMINATION   HEENT: Relatively normal.   ABDOMEN: Distended but soft. No tenderness or peritoneal findings.   He has good pink feet.   LUNGS: Clear.   CARDIAC: Rhythm and rate regular.    LABORATORY STUDIES: Showed hemoglobin of 11, normal renal   function. His stool is heme-positive.    IMPRESSION: Abdominal pain, urinary retention and heme-positive   stools following percutaneous endovascular aneurysm repair. I think   most of his lower abdominal symptoms are from his urinary retention.   The patient is to get an upper endoscopy. It may be good to do a   thorough bowel prep and a colonoscopy or at least a flexible   sigmoidoscopy to evaluate his lower abdominal discomfort. We will   follow along with you, but CT scan shows no problems with his   aneurysm repair.        Antonieta Pert.  Carole Binning., MD      FDS / Mariann Laster   D:  06/26/2015   09:20   T:  06/26/2015   09:58   Job #:  [id number]

## 2015-06-26 NOTE — Procedures (Signed)
Procedures  by Inez Pilgrim, MD at 06/26/15 1356                Author: Inez Pilgrim, MD  Service: Gastroenterology  Author Type: Physician       Filed: 06/26/15 1401  Date of Service: 06/26/15 1356  Status: Signed          Editor: Inez Pilgrim, MD (Physician)            Pre-procedure Diagnoses        1. Melena [K92.1]                           Post-procedure Diagnoses        1. Esophagitis [K20.9]        2. Barrett's esophagus without dysplasia [K22.70]        3. Duodenitis [K29.80]                           Procedures        1. UPPER GI ENDOSCOPY,BIOPSY [[id number]]                              [place]   [place], [place]                       NAME:  DEMETRIOS BYRON     DOB:   [birth year]    MRN:   [id number]       Date/Time:   06/26/2015 1:58 PM      Esophagogastroduodenoscopy (EGD) Procedure Note      Operator:  Inez Pilgrim, MD      Referring Provider:  Francisca December, NP      Anethesia/Sedation:  MAC anesthesia Propofol      Preoperative diagnosis: Anemia      Postoperative diagnosis: 1.- Duodenitis   2.- Esophagitis      Procedure Details       After infom consent was obtained for the procedure, with all risks and benefits of procedure explained the patient was taken to the endoscopy suite and placed in the left lateral decubitus position.  Following sequential administration of sedation as  per above, the GIFH180 gastroscope was inserted into the mouth and advanced under direct vision to second portion of the duodenum.  A careful  inspection was made as the gastroscope was withdrawn, including a retroflexed view of the proximal stomach; findings and interventions are described below.        Findings:   Esophagus:Barrett mucosa at 30 cm, biopsies done at 30, 35 cm. Severe Grade 4 distal esophagitis, biopsies done.   Stomach:normal mucosa, CLO done   Duodenum/jejunum:erosive duodenitis in bulb         Therapies:  none      Specimens: esophageal biopsies, CLO  test.             EBL: None      Complications:   None; patient tolerated the procedure well.             Impression:     See Postoperative diagnosis above      Recommendations:   -Acid suppression with a proton pump inhibitor., -Await pathology., -Await CLO test result and treat for Helicobacter pylori if positive., -Full liquid diet, advance diet as tolerated.  Inez Pilgrim, MD

## 2015-06-26 NOTE — Progress Notes (Signed)
Vascular  VSS  Large dark bowel movement last night  nontender abdominal exam  May also need flex sig for lower abdominal pain  For EGD today

## 2015-06-26 NOTE — Progress Notes (Signed)
Bedside shift change report given to Cary RN (oncoming nurse) by Cody RN (offgoing nurse). Report included the following information SBAR, Kardex, Intake/Output and MAR.

## 2015-06-26 NOTE — Anesthesia Post-Procedure Evaluation (Signed)
Post-Anesthesia Evaluation and Assessment    Patient: Damon [name] MRN: [id number]  SSN: [id number]    Date of Birth: [birth year]  Age: 64 y.o.  Sex: male       Cardiovascular Function/Vital Signs  Visit Vitals   ??? BP 126/74   ??? Pulse 61   ??? Temp 36.9 ??C (98.4 ??F)   ??? Resp 24   ??? Ht 5\' 10"  (1.778 m)   ??? Wt 80.3 kg (177 lb 2 oz)   ??? SpO2 100%   ??? BMI 25.41 kg/m2       Patient is status post MAC anesthesia for Procedure(s):  ESOPHAGOGASTRODUODENOSCOPY (EGD)  ESOPHAGOGASTRODUODENAL (EGD) BIOPSY.    Nausea/Vomiting: None    Postoperative hydration reviewed and adequate.    Pain:  Pain Scale 1: Numeric (0 - 10) (06/26/15 1440)  Pain Intensity 1: 0 (06/26/15 1440)   Managed    Neurological Status:       At baseline    Mental Status and Level of Consciousness: Arousable    Pulmonary Status:   O2 Device: Room air (06/26/15 1440)   Adequate oxygenation and airway patent    Complications related to anesthesia: None    Post-anesthesia assessment completed. No concerns    Signed By: Lafe Garin, MD     June 26, 2015

## 2015-06-26 NOTE — Progress Notes (Signed)
Bedside shift change report given to [place] (oncoming nurse) by Elberta Fortis (offgoing nurse). Report included the following information SBAR, MAR and Recent Results.

## 2015-06-26 NOTE — Anesthesia Pre-Procedure Evaluation (Signed)
Anesthetic History   No history of anesthetic complications            Review of Systems / Medical History  Patient summary reviewed, nursing notes reviewed and pertinent labs reviewed    Pulmonary          Smoker         Neuro/Psych   Within defined limits           Cardiovascular    Hypertension          PAD      Comments: S/p AAA repair/ endovascular   GI/Hepatic/Renal     GERD           Endo/Other        Anemia     Other Findings   Comments: GI bleed           Physical Exam    Airway  Mallampati: II  TM Distance: > 6 cm  Neck ROM: normal range of motion   Mouth opening: Normal     Cardiovascular  Regular rate and rhythm,  S1 and S2 normal,  no murmur, click, rub, or gallop             Dental  No notable dental hx       Pulmonary  Breath sounds clear to auscultation               Abdominal  GI exam deferred       Other Findings            Anesthetic Plan    ASA: 3  Anesthesia type: MAC          Induction: Intravenous  Anesthetic plan and risks discussed with: Patient

## 2015-06-26 NOTE — Progress Notes (Signed)
TRANSFER - IN REPORT:    Verbal report received from [place], RN on AVENIR LOZINSKI  being received from ENDO for routine progression of care      Report consisted of patient???s Situation, Background, Assessment and   Recommendations(SBAR).     Information from the following report(s) SBAR, Kardex and MAR was reviewed with the receiving nurse.    Opportunity for questions and clarification was provided.      Assessment completed upon patient???s arrival to unit and care assumed.

## 2015-06-26 NOTE — Other (Signed)
Damon [name]  [birth year]  [id number]    Situation:  Verbal report received from: Orma Flaming Ward RN  Procedure: Procedure(s):  ESOPHAGOGASTRODUODENOSCOPY (EGD)  ESOPHAGOGASTRODUODENAL (EGD) BIOPSY    Background:    Preoperative diagnosis: Anemia  Postoperative diagnosis: 1.- Duodenitis  2.- Esophagitis    Operator:  Dr. Dwyane Dee  Assistant(s): Endoscopy Technician-1: [place] IV  Endoscopy RN-1: [facility], RN    Specimens:   ID Type Source Tests Collected by Time Destination   1 : Distal Esophageal Biopsy Preservative   Inez Pilgrim, MD 06/26/2015 1350 Pathology   2 : Esophagus at 30cm Biopsy Preservative   Inez Pilgrim, MD 06/26/2015 1358 Pathology   3 : Esophagus at 35 cm Biopsy Preservative   Inez Pilgrim, MD 06/26/2015 1359 Pathology     H. Pylori  yes    Assessment:  Intra-procedure medications         Anesthesia gave intra-procedure sedation and medications, see anesthesia flow sheet yes    Intravenous fluids: NS@ KVO     Vital signs stable     Abdominal assessment: round and soft     Recommendation:    Return to floor  Family or Friend No family with pt  Permission to share finding with family or friend yes and n/a

## 2015-06-26 NOTE — Progress Notes (Signed)
Dr Dwyane Dee notified of pt positive H Pylori result

## 2015-06-26 NOTE — Progress Notes (Addendum)
TRANSFER - OUT REPORT:    Verbal report given to E. Ward, RN on Damon [name]  being transferred to Endo for ordered procedure       Report consisted of patient???s Situation, Background, Assessment and   Recommendations(SBAR).     Information from the following report(s) SBAR, Kardex and MAR was reviewed with the receiving nurse.    Lines:   Peripheral IV 06/24/15 Left Hand (Active)   Site Assessment Clean, dry, & intact 06/26/2015 10:49 AM   Phlebitis Assessment 0 06/26/2015 10:49 AM   Infiltration Assessment 0 06/26/2015 10:49 AM   Dressing Status Clean, dry, & intact 06/26/2015 10:49 AM   Dressing Type Transparent 06/26/2015 10:49 AM   Hub Color/Line Status Infusing 06/26/2015 10:49 AM   Action Taken Open ports on tubing capped 06/26/2015 12:33 AM   Alcohol Cap Used Yes 06/26/2015 10:49 AM        Opportunity for questions and clarification was provided.      Patient transported with:   [employer]

## 2015-06-26 NOTE — Progress Notes (Signed)
Anesthesia reports 200mg Propofol given during procedure.  Received report from anesthesia staff on vital signs and status of patient.

## 2015-06-26 NOTE — Procedures (Signed)
[place]  [place], [place]                 NAME:  Damon [name]   DOB:   08/22/1951   MRN:   [id number]     Date/Time:  06/26/2015 1:58 PM    Esophagogastroduodenoscopy (EGD) Procedure Note    Operator:  Inez Pilgrim, MD    Referring Provider:  Francisca December, NP    Anethesia/Sedation:  MAC anesthesia Propofol    Preoperative diagnosis: Anemia    Postoperative diagnosis: 1.- Duodenitis  2.- Esophagitis    Procedure Details     After infom consent was obtained for the procedure, with all risks and benefits of procedure explained the patient was taken to the endoscopy suite and placed in the left lateral decubitus position.  Following sequential administration of sedation as per above, the GIFH180 gastroscope was inserted into the mouth and advanced under direct vision to second portion of the duodenum.  A careful inspection was made as the gastroscope was withdrawn, including a retroflexed view of the proximal stomach; findings and interventions are described below.      Findings:  Esophagus:Barrett mucosa at 30 cm, biopsies done at 30, 35 cm. Severe Grade 4 distal esophagitis, biopsies done.  Stomach:normal mucosa, CLO done  Duodenum/jejunum:erosive duodenitis in bulb      Therapies:  none    Specimens: esophageal biopsies, CLO test.           EBL: None    Complications:   None; patient tolerated the procedure well.           Impression:    See Postoperative diagnosis above    Recommendations:  -Acid suppression with a proton pump inhibitor., -Await pathology., -Await CLO test result and treat for Helicobacter pylori if positive., -Full liquid diet, advance diet as tolerated.    Inez Pilgrim, MD

## 2015-06-27 LAB — METABOLIC PANEL, COMPREHENSIVE
A-G Ratio: 0.8 — ABNORMAL LOW (ref 1.1–2.2)
ALT (SGPT): 23 U/L (ref 12–78)
AST (SGOT): 16 U/L (ref 15–37)
Albumin: 2.6 g/dL — ABNORMAL LOW (ref 3.5–5.0)
Alk. phosphatase: 73 U/L (ref 45–117)
Anion gap: 10 mmol/L (ref 5–15)
BUN/Creatinine ratio: 13 (ref 12–20)
BUN: 11 MG/DL (ref 6–20)
Bilirubin, total: 0.7 MG/DL (ref 0.2–1.0)
CO2: 22 mmol/L (ref 21–32)
Calcium: 8 MG/DL — ABNORMAL LOW (ref 8.5–10.1)
Chloride: 103 mmol/L (ref 97–108)
Creatinine: 0.86 MG/DL (ref 0.70–1.30)
GFR est AA: >60 mL/min/{1.73_m2} (ref >60)
GFR est non-AA: >60 mL/min/{1.73_m2} (ref >60)
Globulin: 3.3 g/dL (ref 2.0–4.0)
Glucose: 84 mg/dL (ref 65–100)
Potassium: 3.9 mmol/L (ref 3.5–5.1)
Protein, total: 5.9 g/dL — ABNORMAL LOW (ref 6.4–8.2)
Sodium: 135 mmol/L — ABNORMAL LOW (ref 136–145)

## 2015-06-27 LAB — CBC W/O DIFF
HCT: 30.1 % — ABNORMAL LOW (ref 36.6–50.3)
HGB: 10.3 g/dL — ABNORMAL LOW (ref 12.1–17.0)
MCH: 28.4 PG (ref 26.0–34.0)
MCHC: 34.2 g/dL (ref 30.0–36.5)
MCV: 82.9 FL (ref 80.0–99.0)
PLATELET: 326 10*3/uL (ref 150–400)
RBC: 3.63 M/uL — ABNORMAL LOW (ref 4.10–5.70)
RDW: 13.7 % (ref 11.5–14.5)
WBC: 6.9 10*3/uL (ref 4.1–11.1)

## 2015-06-27 MED ORDER — LACTULOSE 10 GRAM/15 ML ORAL SOLUTION
10 gram/15 mL | Freq: Every evening | ORAL | 1 refills | Status: DC
Start: 2015-06-27 — End: 2016-08-28

## 2015-06-27 MED ORDER — PANTOPRAZOLE 40 MG TAB, DELAYED RELEASE
40 mg | Freq: Two times a day (BID) | ORAL | Status: DC
Start: 2015-06-27 — End: 2015-06-27
  Administered 2015-06-27: 16:00:00 via ORAL

## 2015-06-27 MED ORDER — TAMSULOSIN SR 0.4 MG 24 HR CAP
0.4 mg | ORAL_CAPSULE | Freq: Every day | ORAL | 0 refills | Status: AC
Start: 2015-06-27 — End: 2015-07-27

## 2015-06-27 MED ORDER — AMOXICILLIN 500 MG-CLARITHROMYCIN 500 MG-LANSOPRAZOLE 30 MG COMBO PACK
500-500-30 mg | ORAL_TABLET | Freq: Two times a day (BID) | ORAL | 0 refills | Status: AC
Start: 2015-06-27 — End: 2015-07-11

## 2015-06-27 MED ORDER — PANTOPRAZOLE 40 MG GRANULES FOR ORAL SUSP, DELAYED RELEASE
40 mg | Freq: Two times a day (BID) | ORAL | 2 refills | Status: DC
Start: 2015-06-27 — End: 2015-07-04

## 2015-06-27 MED FILL — AMLODIPINE 5 MG TAB: 5 mg | ORAL | Qty: 1

## 2015-06-27 MED FILL — BD POSIFLUSH NORMAL SALINE 0.9 % INJECTION SYRINGE: INTRAMUSCULAR | Qty: 10

## 2015-06-27 MED FILL — LOVASTATIN 20 MG TAB: 20 mg | ORAL | Qty: 2

## 2015-06-27 MED FILL — PROTONIX 40 MG INTRAVENOUS SOLUTION: 40 mg | INTRAVENOUS | Qty: 40

## 2015-06-27 MED FILL — ACETAMINOPHEN 325 MG TABLET: 325 mg | ORAL | Qty: 2

## 2015-06-27 MED FILL — HYDROCHLOROTHIAZIDE 25 MG TAB: 25 mg | ORAL | Qty: 1

## 2015-06-27 MED FILL — LACTULOSE 20 GRAM/30 ML ORAL SOLUTION: 20 gram/30 mL | ORAL | Qty: 30

## 2015-06-27 MED FILL — TAMSULOSIN SR 0.4 MG 24 HR CAP: 0.4 mg | ORAL | Qty: 1

## 2015-06-27 MED FILL — PANTOPRAZOLE 40 MG TAB, DELAYED RELEASE: 40 mg | ORAL | Qty: 1

## 2015-06-27 NOTE — Progress Notes (Signed)
Bedside shift change report given to Cary RN (oncoming nurse) by Cody RN (offgoing nurse). Report included the following information SBAR, Kardex, Intake/Output and MAR.

## 2015-06-27 NOTE — Progress Notes (Signed)
Damon [name], Damon [name]                GI PROGRESS NOTE        NAME:   Damon [name]       DOB:    1950/09/30       MRN:    [id number]     Assessment/Plan   1. Esophagitis: Biopsies show ulcerative esophagitis. Continue BID PPI. Add Carafate. Patient is feeling better. OK to DC from GI standpoint if he tolerates diet.Biopsies positive for H Pylori. Patient will be started on Prevpac.  2. Constipation: improved with lactulose.  3. AAA: status post endograft placement.     Patient Active Problem List   Diagnosis Code   ??? Hypertension I10   ??? GERD (gastroesophageal reflux disease) K21.9   ??? Hyperlipidemia E78.5   ??? Abdominal aortic aneurysm without rupture ([place]) I71.4   ??? GI bleed K92.2       Subjective:     Damon [name] is a 64 y.o.  male who was admitted with melena     Review of Systems    Constitutional: negative fever, negative chills, negative weight loss  Eyes:   negative visual changes  ENT:   negative sore throat, tongue or lip swelling  Respiratory:  negative cough, negative dyspnea  Cards:  negative for chest pain, palpitations, lower extremity edema  GI:   See HPI  GU:  negative for frequency, dysuria  Integument:  negative for rash and pruritus  Heme:  negative for easy bruising and gum/nose bleeding  Musculoskel: negative for myalgias,  back pain and muscle weakness  Neuro: negative for headaches, dizziness, vertigo  Psych:  negative for feelings of anxiety, depression           Objective:     VITALS:   Last 24hrs VS reviewed since prior hospitalist progress note. Most recent are:  Visit Vitals   ??? BP 121/79 (BP 1 Location: Right arm, BP Patient Position: At rest)   ??? Pulse (!) 58   ??? Temp 97.9 ??F (36.6 ??C)   ??? Resp 14   ??? Ht 5\' 10"  (1.778 m)   ??? Wt 80.3 kg (177 lb 2 oz)   ??? SpO2 97%   ??? BMI 25.41 kg/m2       Intake/Output Summary (Last 24 hours) at 06/27/15 2211  Last data filed at 06/27/15 1323   Gross per 24 hour   Intake   1674 ml    Output   2050 ml   Net   -376 ml        PHYSICAL EXAM:  General   well developed, well nourished, appears stated age, in no acute distress  EENT  Normocephalic, Atraumatic, PERRLA, EOMI, sclera clear, nares clear, pharynx normal  Neck   Supple without nodes or mass. No thyromegaly or bruit  Respiratory   Clear To Auscultation bilaterally - no wheezes, rales, rhonchi, or crackles  Cardiology  Regular Rate and Rythmn  - no murmurs, rubs or gallops  Abdominal  Soft, non-tender, mildly distended, positive bowel sounds, no hepatosplenomegaly, no palpable mass  Extremities  No clubbing, cyanosis, or edema. Pulses intact.  Skin  Normal skin turgor.  No rashes or skin ulcers noted  Neurological  No focal neurological deficits noted  Psychological  Oriented x 3.  Normal affect.       Lab Data:reviewed      Medications Reviewed  PMH/SH reviewed - no change compared to H&P  Attending Physician: Inez Pilgrim, MD   Date/Time:  06/27/2015    ________________________________________________________________________

## 2015-06-27 NOTE — Progress Notes (Signed)
Vascular  Feet warm and well vascularized  Abdominal pain gone  Home with foley  See in my office in 2 weeks

## 2015-06-27 NOTE — Progress Notes (Addendum)
I have reviewed discharge instructions with the patient.  The patient verbalized understanding. Patient leaving to go home by car with daughter; patient leaving with copy of discharge instructions, 4 prescriptions filled by bedside pharmacy, extra foley bag supplies, personal belongings; patient signed electroncically; volunteers called to assist patient to discharge area    Education taught with patient on emptying foley bag at home; leg foley bag was attached to patients leg; patientt will discharge with foley catheter

## 2015-06-27 NOTE — Discharge Summary (Signed)
Discharge Summary       PATIENT ID: Damon [name]  MRN: [id number]   DATE OF BIRTH: July 08, 1951    DATE OF ADMISSION: 06/24/2015  4:09 PM    DATE OF DISCHARGE: 06/27/2015   PRIMARY CARE PROVIDER: Francisca December, NP     ATTENDING PHYSICIAN: Dr Burnadette Peter  DISCHARGING PROVIDER: Burnadette Peter, MD    To contact this individual call [phone] and ask the operator to page.  If unavailable ask to be transferred the Adult Hospitalist Department.    CONSULTATIONS: IP CONSULT TO HOSPITALIST  IP CONSULT TO GASTROENTEROLOGY  IP CONSULT TO UROLOGY    PROCEDURES/SURGERIES: Procedure(s):  ESOPHAGOGASTRODUODENOSCOPY (EGD)  ESOPHAGOGASTRODUODENAL (EGD) BIOPSY    ADMITTING DIAGNOSES & HOSPITAL COURSE:   Abdominal pain  -Now resolved  -sec to multifactorial including constipation, duodenitis, urinary retention  -CT abd/pelvis 10/3 shows aortobifem endograft without evidence of hemorrhage. Presumed hematoma in right inguinal region. Markedly distended bladder. 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 Severe grade 4 esophagitis with duodenitis.   -On Protonix  -H pylori positive. Will start the patient on Prevpac  -Will discharge the patient with outpatient follow up with GI    Urinary retention  -Now s/p foley  -Could not see Urology. Will ask the patient to follow up with Urology as an outpatient in a week for voiding trial    Mild renal dysfunction  -sec to above  -US renal ??? unremarkable  -Now resolved    HTN  Stable    Constipation  -Patient claims usual for him to be constipated  -Now resolved  -Outpatient follow up with PMD    Cardiac diet    Code status: Full code        [place] / PLAN:      1.  Abd pain  2. GI bleed       PENDING TEST RESULTS:   At the time of discharge the following test results are still pending: none    FOLLOW UP APPOINTMENTS:    Follow-up Information      Follow up With Details Comments Contact Info    Alesia Morin., MD In 2 weeks  [place] [address]  [phone]      Francisca December, NP In 1 week  [address]    [facility]  [place] [address]  [phone]      Lindaann Slough, MD In 2 weeks  [address]  [address]  [place] [address]  [phone]             ADDITIONAL CARE RECOMMENDATIONS: none    DIET: Cardiac Diet    ACTIVITY: Activity as tolerated      DISCHARGE MEDICATIONS:  Current Discharge Medication List      START taking these medications    Details   lactulose (CHRONULAC) 10 gram/15 mL solution Take 30 mL by mouth nightly.  Qty: 1 Bottle, Refills: 1      tamsulosin (FLOMAX) 0.4 mg capsule Take 1 Cap by mouth daily for 30 days.  Qty: 30 Cap, Refills: 0      pantoprazole (PROTONIX) 40 mg granules for oral suspension Take 40 mg by mouth Before breakfast and dinner for 30 days. Please start this after completing [place]  Qty: 60 Each, Refills: 2  Amoxicill-Clarithro-Lansopraz (PREVPAC) 500-500-30 mg cmpk Take 1 Tab by mouth two (2) times a day for 14 days.  Qty: 28 Tab, Refills: 0         CONTINUE these medications which have NOT CHANGED    Details   amLODIPine (NORVASC) 5 mg tablet TAKE ONE TABLET BY MOUTH ONCE DAILY FOR BLOOD PRESSURE  Qty: 90 Tab, Refills: 0      lisinopril-hydrochlorothiazide (PRINZIDE, ZESTORETIC) 20-25 mg per tablet Take 1 Tab by mouth daily.  Qty: 90 Tab, Refills: 1    Associated Diagnoses: Essential hypertension with goal blood pressure less than 130/80      lovastatin (MEVACOR) 40 mg tablet Take 1 Tab by mouth nightly.  Qty: 90 Tab, Refills: 1    Associated Diagnoses: Pure hypercholesterolemia      aspirin delayed-release 81 mg tablet Take  by mouth daily.    Associated Diagnoses: Hypertension         STOP taking these medications       ranitidine (ACID CONTROL, RANITIDINE,) 150 mg tablet Comments:   Reason for Stopping:                  NOTIFY YOUR PHYSICIAN FOR ANY OF THE FOLLOWING:   Fever over 101 degrees for 24 hours.   Chest pain, shortness of breath, fever, chills, nausea, vomiting, diarrhea, change in mentation, falling, weakness, bleeding. Severe pain or pain not relieved by medications.  Or, any other signs or symptoms that you may have questions about.    DISPOSITION:   x Home With:   OT  PT  HH  RN       Long term SNF/Inpatient Rehab    Independent/assisted living    Hospice    Other:       PATIENT CONDITION AT DISCHARGE:     Functional status    Poor     Deconditioned    x Independent      Cognition   x  Lucid     Forgetful     Dementia      Catheters/lines (plus indication)    Foley     PICC     PEG    x None      Code status    x Full code     DNR      PHYSICAL EXAMINATION AT DISCHARGE:  Please see progress note      CHRONIC MEDICAL DIAGNOSES:  Problem List as of 06/27/2015  Date Reviewed: 07/12/15          Codes Class Noted - Resolved    GI bleed ICD-10-CM: K92.2  ICD-9-CM: 578.9  06/25/2015 - Present        Abdominal aortic aneurysm without rupture ([place]) ICD-10-CM: I71.4  ICD-9-CM: 441.4  07/12/2015 - Present        Hyperlipidemia ICD-10-CM: E78.5  ICD-9-CM: 272.4  Unknown - Present        Hypertension ICD-10-CM: I10  ICD-9-CM: 401.9  11/04/2011 - Present        GERD (gastroesophageal reflux disease) ICD-10-CM: K21.9  ICD-9-CM: 530.81  11/04/2011 - Present              Greater than 35 minutes were spent with the patient on counseling and coordination of care    Signed:   Burnadette Peter, MD  06/27/2015  11:32 AM

## 2015-06-27 NOTE — Progress Notes (Signed)
Hospitalist Progress Note  Burnadette Peter, MD  Office: [phone]      Date of Service:  06/27/2015  NAME:  Damon [name]  DOB:  12/01/1950  MRN:  [id number]      Chief Complaints:    abd "cramp like" pain off and on persists. Non radiating sharp. No nausea or vomiting    Admission Summary:   64 year old gentleman with past medical history of hypertension, hyperlipidemia and a history of AAA with recent repair of AAA via femoral artery approach and stenting who presents to the hospital with abdominal pain.    Interval history / Subjective:     No nausea or vomiting. Had a bowel movement at night     Assessment & Plan:     Abdominal pain  -Now resolved  -sec to multifactorial including constipation, duodenitis, urinary retention  -CT abd/pelvis 10/3 shows aortobifem endograft without evidence of hemorrhage. Presumed hematoma in right inguinal region. Markedly distended bladder. 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 Severe grade 4 esophagitis with duodenitis.   -On Protonix  -H pylori positive. Will start the patient on Prevpac  -Will discharge the patient with outpatient follow up with GI    Urinary retention  -Now s/p foley  -Could not see Urology. Will ask the patient to follow up with Urology as an outpatient in a week for voiding trial    Mild renal dysfunction  -sec to above  -US renal ??? unremarkable  -Now resolved    HTN  Stable    Constipation  -Patient claims usual for him to be constipated  -Now resolved  -Outpatient follow up with PMD    Cardiac diet    Code status: Full code  DVT prophylaxis: scd    Plan: Discharge the patient with outpatient follow up    Care Plan discussed with: Patient/Family  Disposition: TBD     Hospital Problems  Date Reviewed: 06-24-15          Codes Class Noted POA     GI bleed ICD-10-CM: K92.2  ICD-9-CM: 578.9  06/25/2015 Unknown                Review of Systems:   Pertinent items are noted in HPI.       Vital Signs:    Last 24hrs VS reviewed since prior progress note. Most recent are:  Visit Vitals   ??? BP 121/79 (BP 1 Location: Right arm, BP Patient Position: At rest)   ??? Pulse (!) 58   ??? Temp 97.9 ??F (36.6 ??C)   ??? Resp 14   ??? Ht 5\' 10"  (1.778 m)   ??? Wt 80.3 kg (177 lb 2 oz)   ??? SpO2 97%   ??? BMI 25.41 kg/m2         Intake/Output Summary (Last 24 hours) at 06/27/15 1020  Last data filed at 06/27/15 0917   Gross per 24 hour   Intake   2631 ml   Output   3625 ml   Net   -994 ml        Physical Examination:      **Comprehensive Exam must document 8 or more systems**        Constitutional:  No acute distress, cooperative, pleasant??   ENT:  Oral mucous moist, oropharynx benign. Neck supple,    Resp:  CTA bilaterally. No  wheezing/rhonchi/rales. No accessory muscle use   CV:  Regular rhythm, normal rate, no murmurs, gallops, rubs    GI:  Soft, non distended, non tender . normoactive bowel sounds, no hepatosplenomegaly     Musculoskeletal:  No edema, warm, 2+ pulses throughout    Neurologic:  Moves all extremities.  AAOx3, CN II-XII reviewed     Skin:  Good turgor, no rashes or ulcers     Data Review:    Review and/or order of clinical lab test      Labs:     Recent Labs      06/27/15   0313  06/26/15   0604   06/24/15   1435   WBC  6.9   --    --   10.4   HGB  10.3*  10.6*   < >  13.7   HCT  30.1*  31.3*   < >  40.4   PLT  326   --    --   359    < > = values in this interval not displayed.     Recent Labs      06/27/15   0313  06/26/15   0604  06/25/15   0502   NA  135*  134*  136   K  3.9  3.8  4.0   CL  103  102  102   CO2  22  24  25    BUN  11  13  26*   CREA  0.86  0.93  1.08   GLU  84  86  105*   CA  8.0*  8.1*  8.4*     Recent Labs      06/27/15   0313  06/25/15   0502  06/24/15   1435   SGOT  16  14*  32   ALT  23  23  33   AP  73  79  93   TBILI  0.7  0.9  0.7    TP  5.9*  6.6  7.9   ALB  2.6*  2.7*  3.3*   GLOB  3.3  3.9  4.6*     Recent Labs      06/24/15   2025   INR  1.0   PTP  10.3      No results for input(s): FE, TIBC, PSAT, FERR in the last 72 hours.   No results found for: FOL, RBCF   No results for input(s): PH, PCO2, PO2 in the last 72 hours.  No results for input(s): CPK, CKNDX, TROIQ in the last 72 hours.    No lab exists for component: CPKMB  Lab Results   Component Value Date/Time    CHOLESTEROL, TOTAL 153 02/15/2015 01:02 PM    HDL CHOLESTEROL 36 02/15/2015 01:02 PM    LDL, CALCULATED 100 02/15/2015 01:02 PM    TRIGLYCERIDE 84 02/15/2015 01:02 PM    CHOL/HDL RATIO 4.0 11/01/2013 11:39 AM     Lab Results   Component Value Date/Time    GLUCOSE POC 125 nonfasting 11/16/2012 10:12 AM     Lab Results   Component Value Date/Time    COLOR YELLOW/STRAW 06/24/2015 03:29 PM    APPEARANCE CLEAR 06/24/2015 03:29 PM    SPECIFIC GRAVITY 1.020 06/24/2015 03:29 PM    PH (UA) 6.5 06/24/2015 03:29 PM    PROTEIN NEGATIVE  06/24/2015 03:29 PM    GLUCOSE NEGATIVE  06/24/2015 03:29 PM    KETONE TRACE  06/24/2015 03:29 PM    BILIRUBIN NEGATIVE  06/24/2015 03:29 PM    UROBILINOGEN 0.2 06/24/2015 03:29 PM    NITRITES NEGATIVE  06/24/2015 03:29 PM    LEUKOCYTE ESTERASE NEGATIVE  06/24/2015 03:29 PM    EPITHELIAL CELLS FEW 06/24/2015 03:29 PM    BACTERIA NEGATIVE  06/24/2015 03:29 PM    WBC 0-4 06/24/2015 03:29 PM    RBC 5-10 06/24/2015 03:29 PM         ______________________________________________________________________  EXPECTED LENGTH OF STAY:  2 days               Burnadette Peter, MD

## 2015-06-28 NOTE — Progress Notes (Signed)
[facility] Follow Up for [facility] Admission from 06/24/15 - 06/27/15 for Gastrointestinal hemorrhage.      RRAT score: 15 Moderate  Medical History:     Past Medical History   Diagnosis Date   ??? Aneurysm ([place]) 2016     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   ??? 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 GERD (gastroesophageal reflux disease)    ??? HTN (hypertension)    ??? Hyperlipidemia        This represents Transitions of Care b/c NN spoke with patient and/or caregiver within 1 business days of discharge.  Pt's TCM follow up appt is scheduled with Catha Nottingham 07/04/15 on Thursday  @ 8:50 am which is within 7 days of discharge.    Called patient on 06/27/15 and verified with 2 identifiers.     Patient presenting symptoms abdominal pain, urinary retention.    Course of current Hospitalization (referenced by Dr. Bethanie Dicker discharge summary note): [place]:   Abdominal pain  -Now resolved  -sec to multifactorial including constipation, duodenitis, urinary retention  -CT abd/pelvis 10/3 shows aortobifem endograft without evidence of hemorrhage. Presumed hematoma in right inguinal region. Markedly distended bladder. Right inguinal hematoma  -Appreciate GI/Vascular  ??  AAA  -recent endograft  -Spoke to Dr Kathrin Penner  ??  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 Severe grade 4 esophagitis with duodenitis. ??  -On Protonix  -H pylori positive. Will start the patient on Prevpac  -Will discharge the patient with outpatient follow up with GI  ??  Urinary retention  -Now s/p foley  -Could not see Urology. Will ask the patient to follow up with Urology as an outpatient in a week for voiding trial  ??  Mild renal dysfunction  -sec to above  -US renal ??? unremarkable  -Now resolved  ??  HTN  Stable  ??  Constipation  -Patient claims usual for him to be constipated  -Now resolved   -Outpatient follow up with PMD  ??  Cardiac diet  ??  Code status: Full code  ??    Diagnosed with see above. Admitted to Hospitalist Service with consults from Vascular surgery, GI, Urology.  Consults recommended :    As copied from consult note from  GI Dr.  Inez Pilgrim recommended on 06/25/15:   Plan:??   -Clear liquid diet  -Start PPI BID  -Check KUB  -Plan for possible EGD tomorrow  -Monitor H&H and follow clinical course for any signs of overt bleeding  -Transfuse as necessary ??  -Discussed with Dr. Dwyane Dee  -Will follow along with you  -Thank you kindly for allowing [place] to participate in the care of this patient ??    I have examined the patient. I have reviewed the chart and agree with the documentation recorded by the NP, including the assessment, treatment plan, and disposition.  ??  ASSESSMENT AND PLAN:  64 yr old male has presented with melena and abdominal distension and constipation 1 week after an Endograft placement and thrombectomy for AAA. Differential diagnosis includes PUD, gastritis, esophagitis, Ileus, bowel obstruction,etc.  NPO  EGD tomorrow.  IV PPI  Increase Lactulose to 30 ml PO TID  Fleets enema  Follow serial hemoglobins and transfuse if needed.We will follow with you.  ??  Inez Pilgrim, MD     As copied from 06/26/15 consult  note from Dr. Kathrin Penner: IMPRESSION: Abdominal pain, urinary retention and heme-positive ??  stools following percutaneous endovascular aneurysm repair. I think ??  most of his lower abdominal symptoms are from his urinary retention. ??  The patient is to get an upper endoscopy. It may be good to do a ??  thorough bowel prep and a colonoscopy or at least a flexible ??  sigmoidoscopy to evaluate his lower abdominal discomfort. We will ??  follow along with you, but CT scan shows no problems with his ??  aneurysm repair.  ??    As copied from Dr. Jenny Reichmann Delisio's 06/25/15 consult note: IMPRESSION   1. Benign prostatic hypertrophy with obstruction.   2. Urinary retention.  ??   PLAN: I had a discussion with the patient and his daughter was   present. I explained to him that we probably need to discharge him   with his Foley catheter in place. We can see about discharging him   and have him follow up next week for a void trial. We will go ahead and   start Tamsulosin 0.4 mg capsules. I explained to him the proper use   and side effects of the medication. If he fails his voiding trial, we will go   ahead and reinsert the catheter, give him a longer trial of medication   and have him back in several weeks for a repeat void trial.  ??  ??  ??  Babs Sciara, MD       Significant Lab/Diagnostic Findings: H. Pylori positive and undergoing treatment for this. Also, see pathology report from 06/26/15.    Recent Results (from the past 168 hour(s))   CBC WITH AUTOMATED DIFF    Collection Time: 06/24/15  2:35 PM   Result Value Ref Range    WBC 10.4 4.1 - 11.1 K/uL    RBC 4.80 4.10 - 5.70 M/uL    HGB 13.7 12.1 - 17.0 g/dL    HCT 40.4 36.6 - 50.3 %    MCV 84.2 80.0 - 99.0 FL    MCH 28.5 26.0 - 34.0 PG    MCHC 33.9 30.0 - 36.5 g/dL    RDW 14.6 (H) 11.5 - 14.5 %    PLATELET 359 150 - 400 K/uL    NEUTROPHILS 72 32 - 75 %    LYMPHOCYTES 14 12 - 49 %    MONOCYTES 12 5 - 13 %    EOSINOPHILS 2 0 - 7 %    BASOPHILS 0 0 - 1 %    ABS. NEUTROPHILS 7.4 1.8 - 8.0 K/UL    ABS. LYMPHOCYTES 1.5 0.8 - 3.5 K/UL    ABS. MONOCYTES 1.3 (H) 0.0 - 1.0 K/UL    ABS. EOSINOPHILS 0.2 0.0 - 0.4 K/UL    ABS. BASOPHILS 0.0 0.0 - 0.1 K/UL   METABOLIC PANEL, COMPREHENSIVE    Collection Time: 06/24/15  2:35 PM   Result Value Ref Range    Sodium 133 (L) 136 - 145 mmol/L    Potassium 4.3 3.5 - 5.1 mmol/L    Chloride 99 97 - 108 mmol/L    CO2 23 21 - 32 mmol/L    Anion gap 11 5 - 15 mmol/L    Glucose 115 (H) 65 - 100 mg/dL    BUN 40 (H) 6 - 20 MG/DL    Creatinine 1.34 (H) 0.70 - 1.30 MG/DL    BUN/Creatinine ratio 30 (H) 12 - 20      GFR est AA >60 >  60 ml/min/1.41m    GFR est non-AA 54 (L) >60 ml/min/1.724m   Calcium 9.7 8.5 - 10.1 MG/DL     Bilirubin, total 0.7 0.2 - 1.0 MG/DL    ALT 33 12 - 78 U/L    AST 32 15 - 37 U/L    Alk. phosphatase 93 45 - 117 U/L    Protein, total 7.9 6.4 - 8.2 g/dL    Albumin 3.3 (L) 3.5 - 5.0 g/dL    Globulin 4.6 (H) 2.0 - 4.0 g/dL    A-G Ratio 0.7 (L) 1.1 - 2.2     URINALYSIS W/ REFLEX CULTURE    Collection Time: 06/24/15  3:29 PM   Result Value Ref Range    Color YELLOW/STRAW      Appearance CLEAR CLEAR      Specific gravity 1.020 1.003 - 1.030      pH (UA) 6.5 5.0 - 8.0      Protein NEGATIVE  NEG mg/dL    Glucose NEGATIVE  NEG mg/dL    Ketone TRACE (A) NEG mg/dL    Bilirubin NEGATIVE  NEG      Blood NEGATIVE  NEG      Urobilinogen 0.2 0.2 - 1.0 EU/dL    Nitrites NEGATIVE  NEG      Leukocyte Esterase NEGATIVE  NEG      WBC 0-4 0 - 4 /hpf    RBC 5-10 0 - 5 /hpf    Epithelial cells FEW FEW /lpf    Bacteria NEGATIVE  NEG /hpf    UA:UC IF INDICATED CULTURE NOT INDICATED BY UA RESULT CNI      Amorphous Crystals FEW (A) NEG     PROTHROMBIN TIME + INR    Collection Time: 06/24/15  8:25 PM   Result Value Ref Range    INR 1.0 0.9 - 1.1      Prothrombin time 10.3 9.0 - 11.1 sec   PSA SCREENING (SCREENING)    Collection Time: 06/24/15  8:25 PM   Result Value Ref Range    Prostate Specific Ag 4.3 (H) 0.0 - 4.0 ng/mL   METABOLIC PANEL, BASIC    Collection Time: 06/24/15 10:56 PM   Result Value Ref Range    Sodium 137 136 - 145 mmol/L    Potassium 4.1 3.5 - 5.1 mmol/L    Chloride 101 97 - 108 mmol/L    CO2 26 21 - 32 mmol/L    Anion gap 10 5 - 15 mmol/L    Glucose 118 (H) 65 - 100 mg/dL    BUN 32 (H) 6 - 20 MG/DL    Creatinine 1.21 0.70 - 1.30 MG/DL    BUN/Creatinine ratio 26 (H) 12 - 20      GFR est AA >60 >60 ml/min/1.7347m  GFR est non-AA >60 >60 ml/min/1.19m43m Calcium 8.5 8.5 - 10.1 MG/DL   HGB & HCT    Collection Time: 06/24/15 10:56 PM   Result Value Ref Range    HGB 12.3 12.1 - 17.0 g/dL    HCT 36.4 (L) 36.6 - 50.369.6 METABOLIC PANEL, COMPREHENSIVE    Collection Time: 06/25/15  5:02 AM   Result Value Ref Range     Sodium 136 136 - 145 mmol/L    Potassium 4.0 3.5 - 5.1 mmol/L    Chloride 102 97 - 108 mmol/L    CO2 25 21 - 32 mmol/L    Anion gap 9 5 - 15 mmol/L  Glucose 105 (H) 65 - 100 mg/dL    BUN 26 (H) 6 - 20 MG/DL    Creatinine 1.08 0.70 - 1.30 MG/DL    BUN/Creatinine ratio 24 (H) 12 - 20      GFR est AA >60 >60 ml/min/1.61m    GFR est non-AA >60 >60 ml/min/1.747m   Calcium 8.4 (L) 8.5 - 10.1 MG/DL    Bilirubin, total 0.9 0.2 - 1.0 MG/DL    ALT 23 12 - 78 U/L    AST 14 (L) 15 - 37 U/L    Alk. phosphatase 79 45 - 117 U/L    Protein, total 6.6 6.4 - 8.2 g/dL    Albumin 2.7 (L) 3.5 - 5.0 g/dL    Globulin 3.9 2.0 - 4.0 g/dL    A-G Ratio 0.7 (L) 1.1 - 2.2     HGB & HCT    Collection Time: 06/25/15  5:02 AM   Result Value Ref Range    HGB 11.9 (L) 12.1 - 17.0 g/dL    HCT 34.7 (L) 36.6 - 50.3 %   HGB & HCT    Collection Time: 06/25/15 12:12 PM   Result Value Ref Range    HGB 11.2 (L) 12.1 - 17.0 g/dL    HCT 33.3 (L) 36.6 - 50.3 %   HGB & HCT    Collection Time: 06/25/15  6:31 PM   Result Value Ref Range    HGB 11.8 (L) 12.1 - 17.0 g/dL    HCT 35.5 (L) 36.6 - 50.3 %   HGB & HCT    Collection Time: 06/26/15 12:29 AM   Result Value Ref Range    HGB 11.0 (L) 12.1 - 17.0 g/dL    HCT 33.0 (L) 36.6 - 5074.2   METABOLIC PANEL, BASIC    Collection Time: 06/26/15  6:04 AM   Result Value Ref Range    Sodium 134 (L) 136 - 145 mmol/L    Potassium 3.8 3.5 - 5.1 mmol/L    Chloride 102 97 - 108 mmol/L    CO2 24 21 - 32 mmol/L    Anion gap 8 5 - 15 mmol/L    Glucose 86 65 - 100 mg/dL    BUN 13 6 - 20 MG/DL    Creatinine 0.93 0.70 - 1.30 MG/DL    BUN/Creatinine ratio 14 12 - 20      GFR est AA >60 >60 ml/min/1.7377m  GFR est non-AA >60 >60 ml/min/1.44m23m Calcium 8.1 (L) 8.5 - 10.1 MG/DL   HGB & HCT    Collection Time: 06/26/15  6:04 AM   Result Value Ref Range    HGB 10.6 (L) 12.1 - 17.0 g/dL    HCT 31.3 (L) 36.6 - 50.3 %   PSA SCREENING (SCREENING)    Collection Time: 06/26/15  6:04 AM   Result Value Ref Range     Prostate Specific Ag 3.5 0.0 - 4.0 ng/mL   POC H. PYLORI, TISSUE    Collection Time: 06/26/15  1:51 PM   Result Value Ref Range    H. pylori from tissue Positive Negative    Positive control positive     Negative control negative     Lot no. [id number]    CBC W/O DIFF    Collection Time: 06/27/15  3:13 AM   Result Value Ref Range    WBC 6.9 4.1 - 11.1 K/uL    RBC 3.63 (L) 4.10 - 5.70 M/uL  HGB 10.3 (L) 12.1 - 17.0 g/dL    HCT 30.1 (L) 36.6 - 50.3 %    MCV 82.9 80.0 - 99.0 FL    MCH 28.4 26.0 - 34.0 PG    MCHC 34.2 30.0 - 36.5 g/dL    RDW 13.7 11.5 - 14.5 %    PLATELET 326 150 - 009 K/uL   METABOLIC PANEL, COMPREHENSIVE    Collection Time: 06/27/15  3:13 AM   Result Value Ref Range    Sodium 135 (L) 136 - 145 mmol/L    Potassium 3.9 3.5 - 5.1 mmol/L    Chloride 103 97 - 108 mmol/L    CO2 22 21 - 32 mmol/L    Anion gap 10 5 - 15 mmol/L    Glucose 84 65 - 100 mg/dL    BUN 11 6 - 20 MG/DL    Creatinine 0.86 0.70 - 1.30 MG/DL    BUN/Creatinine ratio 13 12 - 20      GFR est AA >60 >60 ml/min/1.85m    GFR est non-AA >60 >60 ml/min/1.738m   Calcium 8.0 (L) 8.5 - 10.1 MG/DL    Bilirubin, total 0.7 0.2 - 1.0 MG/DL    ALT 23 12 - 78 U/L    AST 16 15 - 37 U/L    Alk. phosphatase 73 45 - 117 U/L    Protein, total 5.9 (L) 6.4 - 8.2 g/dL    Albumin 2.6 (L) 3.5 - 5.0 g/dL    Globulin 3.3 2.0 - 4.0 g/dL    A-G Ratio 0.8 (L) 1.1 - 2.2       XR Results (most recent):    Results from Hospital Encounter encounter on 06/24/15   XR ABD (KUB)   Narrative EXAM:  XR ABD (KUB).    INDICATION: Abdominal distention, evaluate for obstruction.    COMPARISON: 06/24/2015.    FINDINGS:   A portable supine radiograph of the abdomen was obtained at 1342 hours and shows  a aortic endograft. There is moderate gaseous distention of the colon which is  increased slightly. There is no small bowel dilatation. No soft tissue masses or  pathologic calcifications are identified.  The bones and soft tissues are within   normal limits.  The patient is on a cardiac monitor.         Impression IMPRESSION: Increased gaseous distention of the colon.             CT Results (most recent):    Results from Hospital Encounter encounter on 06/24/15   CT ABD PELV W CONT   Narrative EXAM:  CT ABDOMEN PELVIS WITH CONTRAST    INDICATION:  diffuse abd pain; hx of recent AAA repair through femoral artery    COMPARISON: 05/28/2015.    CONTRAST:  100 mL of Isovue-370.    TECHNIQUE:    Multislice helical CT was performed from the diaphragm to the symphysis pubis  during uneventful rapid bolus intravenous contrast administration. Oral contrast  was not administered. Contiguous 5 mm axial images were reconstructed and lung  and soft tissue windows were generated. Coronal and sagittal reformations were  generated. CT dose reduction was achieved through use of a standardized protocol  tailored for this examination and automatic exposure control for dose  modulation.      FINDINGS:  There is a small pleural effusion as well as bibasilar subsegmental atelectasis.    There are no focal abnormalities within the spleen, pancreas, adrenal glands or  kidneys. In hepatic  segment the 8, there is a 2.8 x 2.5 cm peripherally  enhancing structure that was previously determined to represent a hemangioma. A  similar 2.1 x 1.5 cm structure is noted in hepatic segment 7 (image 15).    The aorta, and a stent is noted with extensions into both iliac arteries.. There  is no retroperitoneal adenopathy or mass. There is no ascites or free  intraperitoneal air.    The bowel is normal.     There is no pelvic mass or adenopathy.    The bladder is extremely distended. The superior prostate extends into the  inferior bladder.    In the right inguinal region, there is a small amount of air as well as a 15 x  20 x 68 mm soft tissue density nonenhancing structure, that may represent a  hematoma (image axial 87 and coronal image 54).         Impression IMPRESSION:     Aortobifem endograft without evidence for leakage.   Presumed hematoma in the right inguinal region  Markedly distended bladder.  Prostate enlargement. Please correlate with PSA.  Hepatic hemangiomas as described.            Medication Reconciliation completed: yes New medications at discharge include Prevpac, lactulose, protonix, flomax.   Prescription Medication total: 8 (pharmacy consult for polypharm needed?) no     If multiple admissions, ED visits, check and reviewed PMP:  yes    Barriers to care?  Financial pt does not have prescription drug coverage     Support System consists of: family    Previous Use of [place], if so what agency? no     Advance Medical Directive on file in EMR? no has NO advanced directive  - add't info requested. Referral to SW: not applicable as pt requested this while in hospital and Pastoral care attempted to meet with pt do discuss further.     Future Plan for Patient to include communication plan:  I called pt today and reviewed his recent hospitalization. He has all of his medications and is taking as prescribed. We discussed red flags for his history of GI bleeding. We discussed how to take care of his foley. Pt reports that he is feeling much better than when we talked on the 3rd. I called his daughter, Varney Biles, as well to review his follow up appointments. She has scheduled him for all appointments and she has them all written down. We discussed using the Good Rx coupons and card for reducing refill costs of his medications. Pt reports his foley is draining clear yellow urine. He says his stools are trending to a more "normal" color and do not show the dark chocolate color as before. He and his daughter know how to call me with any questions or concerns.     Adherence to previous treatment and likelihood for f/u: Pt is compliant with follow up and engaged in his care.     Past Hospitalizations/ED visits last 12 months? has been hospitalized  twice / 1 known ER visits in last 12 months.

## 2015-06-28 NOTE — Progress Notes (Signed)
NNTOCIP    Closing post hospital episode of care as pt had another readmission to hospital. Mancel Bale, RN

## 2015-07-04 ENCOUNTER — Ambulatory Visit: Admit: 2015-07-04 | Discharge: 2015-07-04 | Payer: MEDICARE | Attending: Family | Primary: Internal Medicine

## 2015-07-04 DIAGNOSIS — K2961 Other gastritis with bleeding: Secondary | ICD-10-CM

## 2015-07-04 MED ORDER — OMEPRAZOLE 40 MG CAP, DELAYED RELEASE
40 mg | ORAL_CAPSULE | Freq: Every day | ORAL | 3 refills | Status: DC
Start: 2015-07-04 — End: 2015-07-25

## 2015-07-04 MED ORDER — LOVASTATIN 20 MG TAB
20 mg | ORAL_TABLET | Freq: Two times a day (BID) | ORAL | 11 refills | Status: DC
Start: 2015-07-04 — End: 2015-09-05

## 2015-07-04 NOTE — Progress Notes (Signed)
Patient states he will start the Protonix after he finishes the other antibiotics (Amixicillin and clarithromycin).  Coordination of Care  1. Have you been to the ER, urgent care clinic since your last visit?  Hospitalized since your last visit? Yes When: [place], 06/24/2015, abdominal pain    2. Have you seen or consulted any other health care providers outside of the [facility] since your last visit?  Include any pap smears or colon screening. No    Medications  Medication Reconciliation Performed: yes  Patient does not know need refills     Learning Assessment Complete? yes

## 2015-07-04 NOTE — Progress Notes (Signed)
Patient returns December 15.  Plan to check cbc with diff to ensure resolution of reactive thrombocytosis.

## 2015-07-04 NOTE — Patient Instructions (Signed)
Dexilant is very expensive also on GoodRx.  Omeprazole 40mg  is $22.77 at [facility] for 30 days as of today.  Please return for completion of FMLA report.

## 2015-07-04 NOTE — Progress Notes (Signed)
[facility]  Subjective:   Damon [name] is a 64 y.o.BLACK OR [place] English-speaking male.    History of Present Illness: He has recently been admitted to [facility] on 06/24/15 for GI bleed.  He was given dexilant but cannot afford this. Says overall he is feeling much better than before he went into the hospital.  Review of Systems: Negative for: fever, chest pain, shortness of breath, leg swelling, exertional dyspnea, palpitations.  Past Medical History:  has a past medical history of Aneurysm ([place]) (2016); Aneurysm ([place]) (2016); GERD (gastroesophageal reflux disease); HTN (hypertension); and Hyperlipidemia.,   Patient Active Problem List    Diagnosis Date Noted   ??? GI bleed 06/25/2015   ??? Abdominal aortic aneurysm without rupture ([place]) 06/18/2015   ??? Hyperlipidemia    ??? Hypertension 11/04/2011   ??? GERD (gastroesophageal reflux disease) 11/04/2011     Past Surgical History: He  has a past surgical history that includes vascular surgery procedure unlist.  Family History: family history includes Diabetes in his father and paternal uncle; Heart Disease in his brother, father, and sister; Hypertension in his brother, father, mother, sister, and sister; Kidney Disease in his sister and sister.  Social History: He  reports that he has been smoking Cigarettes.  He started smoking about 47 years ago. He has a 22.00 pack-year smoking history. He quit smokeless tobacco use about 7 weeks ago. He reports that he drinks alcohol. He reports that he does not use illicit drugs.  2 months no smoking and no drinking  Damon [name] brought in his medications. Yes    Outpatient Medications Prior to Visit   Medication Sig Dispense Refill   ??? lactulose (CHRONULAC) 10 gram/15 mL solution Take 30 mL by mouth nightly. 1 Bottle 1   ??? tamsulosin (FLOMAX) 0.4 mg capsule Take 1 Cap by mouth daily for 30 days. 30 Cap 0   ??? Amoxicill-Clarithro-Lansopraz (PREVPAC) 500-500-30 mg cmpk Take 1 Tab by  mouth two (2) times a day for 14 days. 28 Tab 0   ??? amLODIPine (NORVASC) 5 mg tablet TAKE ONE TABLET BY MOUTH ONCE DAILY FOR BLOOD PRESSURE 90 Tab 0   ??? lisinopril-hydrochlorothiazide (PRINZIDE, ZESTORETIC) 20-25 mg per tablet Take 1 Tab by mouth daily. 90 Tab 1   ??? aspirin delayed-release 81 mg tablet Take  by mouth daily.     ??? pantoprazole (PROTONIX) 40 mg granules for oral suspension Take 40 mg by mouth Before breakfast and dinner for 30 days. Please start this after completing PREVPAC 60 Each 2   ??? lovastatin (MEVACOR) 40 mg tablet Take 1 Tab by mouth nightly. 90 Tab 1     No facility-administered medications prior to visit.     Taking as ordered? Yes  Objective:     Visit Vitals   ??? BP 109/63 (BP 1 Location: Left arm, BP Patient Position: Sitting)   ??? Pulse 76   ??? Temp 98.3 ??F (36.8 ??C) (Oral)   ??? Wt 173 lb (78.5 kg)   ??? BMI 24.82 kg/m2      Wt Readings from Last 3 Encounters:   07/04/15 173 lb (78.5 kg)   06/24/15 177 lb 2 oz (80.3 kg)   06/20/15 181 lb (82.1 kg)     Physical Examination:  General appearance - well developed, no acute distress.   Chest - clear to auscultation.  Heart - regular rate and rhythm without murmurs, rubs, or gallops.  Abdomen - bowel sounds present x 4,  NT, ND.  Extremities - pulses intact.  No peripheral edema.  Assessment/Plan:   Damon [name] was seen today for hypertension and cholesterol problem.    Diagnoses and all orders for this visit:    Gastrointestinal hemorrhage associated with other gastritis  Comments:  Anemia  -     PR HANDLG&/OR CONVEY OF SPEC FOR TR OFFICE TO LAB  -     PR COLLECTION VENOUS BLOOD,VENIPUNCTURE  -     omeprazole (PRILOSEC) 40 mg capsule; Take 1 Cap by mouth daily. For your stomach    Acute renal insufficiency  -     PR HANDLG&/OR CONVEY OF SPEC FOR TR OFFICE TO LAB  -     PR COLLECTION VENOUS BLOOD,VENIPUNCTURE  -     CBC WITH AUTOMATED DIFF  -     CREATININE    Pure hypercholesterolemia  -     lovastatin (MEVACOR) 20 mg tablet; Take 2 Tabs by mouth two (2)  times a day. For cholesterol    Follow-up Disposition:  Return in about 2 weeks (around 07/18/2015) for as soon as available - to complete FMLA paperwork, primary care needs.   Damon December, MSN, RN, FNP-BC, BC-ADM  Damon [name] expressed understanding of this plan.

## 2015-07-04 NOTE — Progress Notes (Signed)
Damon [name] seen at d/c, given AVS and reviewed today's visit with patient. Scripts given to pt per provider, goodrx coupon given but also explained the phone app. This has been fully explained to the patient, who indicates understanding.

## 2015-07-05 LAB — CBC WITH AUTOMATED DIFF
ABS. BASOPHILS: 0 10*3/uL (ref 0.0–0.2)
ABS. EOSINOPHILS: 0.2 10*3/uL (ref 0.0–0.4)
ABS. IMM. GRANS.: 0 10*3/uL (ref 0.0–0.1)
ABS. MONOCYTES: 0.6 10*3/uL (ref 0.1–0.9)
ABS. NEUTROPHILS: 4.8 10*3/uL (ref 1.4–7.0)
Abs Lymphocytes: 1.6 10*3/uL (ref 0.7–3.1)
BASOPHILS: 0 %
EOSINOPHILS: 3 %
HCT: 36.7 % — ABNORMAL LOW (ref 37.5–51.0)
HGB: 12.4 g/dL — ABNORMAL LOW (ref 12.6–17.7)
IMMATURE GRANULOCYTES: 0 %
Lymphocytes: 22 %
MCH: 28.5 pg (ref 26.6–33.0)
MCHC: 33.8 g/dL (ref 31.5–35.7)
MCV: 84 fL (ref 79–97)
MONOCYTES: 8 %
NEUTROPHILS: 67 %
PLATELET: 558 10*3/uL — ABNORMAL HIGH (ref 150–379)
RBC: 4.35 x10E6/uL (ref 4.14–5.80)
RDW: 14.5 % (ref 12.3–15.4)
WBC: 7.2 10*3/uL (ref 3.4–10.8)

## 2015-07-05 LAB — CREATININE
Creatinine: 1.25 mg/dL (ref 0.76–1.27)
GFR est AA: 69 mL/min/{1.73_m2} (ref >59)
GFR est non-AA: 60 mL/min/{1.73_m2} (ref >59)

## 2015-07-05 NOTE — Progress Notes (Signed)
Dr. Minerva Fester, he was recently hospitalized for GI bleed with subsequent surgery.  Could this be reactive thrombocytosis?  When would you recommend checking again to ensure resolution?

## 2015-07-08 NOTE — Progress Notes (Signed)
NNTOCIP    Pt confirmed that he went to Urologist last Thursday. They attempted to remove the foley but he was unable to void sufficiently on his own. He says that the foley was replaced. He sees them again this Tuesday. He sees Dr. Kathrin Penner, vascular surgeon on 10/20 and GI specialist on 10/18. Pt has no complaints or concerns about his physical health at this time. He is concerned about bills. I asked pt to please speak with financial counselor at hospital to see if he qualifies for any additional coverage to help with bills. Pt will come back in November for [facility] paperwork.  Mancel Bale, RN

## 2015-07-09 NOTE — Progress Notes (Signed)
Yes, I would follow for now.  Repeat 2-3 months.

## 2015-07-24 ENCOUNTER — Ambulatory Visit: Admit: 2015-07-24 | Discharge: 2015-07-24 | Payer: MEDICARE | Attending: Family | Primary: Internal Medicine

## 2015-07-24 DIAGNOSIS — I714 Abdominal aortic aneurysm, without rupture, unspecified: Secondary | ICD-10-CM

## 2015-07-24 NOTE — Progress Notes (Signed)
[facility]  Subjective:   Chief Complaint   Patient presents with   ??? Abdominal Aortic Aneurysm     pt needs paperwork (FMLA) for daughter for work.   Mr. Hyson and his daughter are here today to have me complete FMLA paperwork so that his daughter can take time off from work to care for her father including taking him to his doctor's appointments.  He feels well today.  He and his daughter express appreciation for medication changes at last visit to reduce medication costs.  Patient has seen GI and has approved the less expensive PPI for the patient to take.    Objective:   Visit Vitals   ??? BP 118/70 (BP 1 Location: Left arm, BP Patient Position: Sitting)   ??? Pulse (!) 58   ??? Temp 97.7 ??F (36.5 ??C) (Oral)   ??? Ht 5\' 10"  (1.778 m)   ??? Wt 173 lb (78.5 kg)   ??? BMI 24.82 kg/m2       Assessment/Plan: Beren was seen today for abdominal aortic aneurysm.    Diagnoses and all orders for this visit:    Abdominal aortic aneurysm without rupture [facility])  Completed FMLA forms for the daughter while her father was present so that she may have time off from work to care for him.  He will sign a release form to obtain a copy of his last labs.  Daughter asking how to obtain a colonoscopy.  I have recommended they call his insurance company to find out if they need a referral or if they may self-refer.

## 2015-07-24 NOTE — Patient Instructions (Signed)
Lab Results  Component Value Date/Time   WBC 7.2 07/04/2015 09:53 AM   HGB 12.4 07/04/2015 09:53 AM   HCT 36.7 07/04/2015 09:53 AM   PLATELET 558 07/04/2015 09:53 AM   MCV 84 07/04/2015 09:53 AM    Your platelets were elevated.  Please return in mid-December to recheck this.    Complete Blood Count (CBC): About This Test  What is it?  A complete blood count (CBC) is a blood test that gives important information about your blood cells, especially red blood cells, white blood cells, and platelets.  Why is this test done?  A CBC may be done as part of a regular physical exam. There are many other reasons that a doctor may want this blood test, including to:  ?? Find the cause of symptoms such as fatigue, weakness, fever, bruising, or weight loss.  ?? Find anemia or an infection.  ?? See how much blood has been lost if there is bleeding.  ?? Diagnose diseases of the blood, such as leukemia or polycythemia.  How can you prepare for the test?  You do not need to do anything before having this test.  What happens during the test?  The health professional taking a sample of your blood will:  ?? Wrap an elastic band around your upper arm. This makes the veins below the band larger so it is easier to put a needle into the vein.  ?? Clean the needle site with alcohol.  ?? Put the needle into the vein.  ?? Attach a tube to the needle to fill it with blood.  ?? Remove the band from your arm when enough blood is collected.  ?? Put a gauze pad or cotton ball over the needle site as the needle is removed.  ?? Put pressure on the site and then put on a bandage.  If this blood test is done on a baby, a heel stick may be done instead of a blood draw from a vein.  What happens after the test?  ?? You will probably be able to go home right away.  ?? You can go back to your usual activities right away.  Follow-up care is a key part of your treatment and safety. Be sure to make and go to all appointments, and call your doctor if you are having  problems. It's also a good idea to keep a list of the medicines you take. Ask your doctor when you can expect to have your test results.  Where can you learn more?  Go to [employer]  Enter [phone] in the search box to learn more about "Complete Blood Count (CBC): About This Test."  ?? 2006-2016 Healthwise, Inc[employer]. Care instructions adapted under license by [employer] (which disclaims liability or warranty for this information). This care instruction is for use with your licensed healthcare professional. If you have questions about a medical condition or this instruction, always ask your healthcare professional. [place] any warranty or liability for your use of this information.  Content Version: 11.0.578772; Current as of: November 09, 2014

## 2015-07-24 NOTE — Progress Notes (Signed)
Pt signed release of records form, lab result flowsheet was faxed to Dr Dwyane Dee at [facility] # [phone]

## 2015-07-24 NOTE — Progress Notes (Signed)
Coordination of Care  1. Have you been to the ER, urgent care clinic since your last visit?  Hospitalized since your last visit? No    2. Have you seen or consulted any other health care providers outside of the [facility] since your last visit?  Include any pap smears or colon screening. Yes When: [facility], 06/2015, follow up    Medications  Medication Reconciliation Performed: yes  Patient does not need refills     Learning Assessment Complete? yes

## 2015-07-25 MED ORDER — OMEPRAZOLE 40 MG CAP, DELAYED RELEASE
40 mg | ORAL_CAPSULE | Freq: Every day | ORAL | 3 refills | Status: DC
Start: 2015-07-25 — End: 2015-09-05

## 2015-07-30 NOTE — Progress Notes (Signed)
NNTOCIP    Transitional Care follow up for completion of 30 day transition of care.  [facility] readmission 06/24/15-06/27/15 for GI Hemorrhage.  Attended TCM f/u appt with Catha Nottingham, NP as scheduled on 07/04/15.Alyson Locket. Koeller denies any hospitalizations since this admission. Goals have been met, no further needs at this time.  Resolved post hospitalization TOC episode. Mancel Bale, RN

## 2015-08-19 NOTE — Addendum Note (Signed)
Addended by: Catha Nottingham T on: 08/19/2015 06:01 PM      Modules accepted: Orders

## 2015-08-28 ENCOUNTER — Institutional Professional Consult (permissible substitution): Admit: 2015-08-28 | Discharge: 2015-08-28 | Payer: MEDICARE | Primary: Internal Medicine

## 2015-08-28 DIAGNOSIS — D75838 Other thrombocytosis: Secondary | ICD-10-CM

## 2015-08-28 NOTE — Progress Notes (Signed)
Pt wa drawn Hem A1C per Maceo Pro, NP.

## 2015-08-29 LAB — CBC WITH AUTOMATED DIFF
ABS. BASOPHILS: 0 10*3/uL (ref 0.0–0.2)
ABS. EOSINOPHILS: 0.5 10*3/uL — ABNORMAL HIGH (ref 0.0–0.4)
ABS. IMM. GRANS.: 0 10*3/uL (ref 0.0–0.1)
ABS. MONOCYTES: 0.4 10*3/uL (ref 0.1–0.9)
ABS. NEUTROPHILS: 3.1 10*3/uL (ref 1.4–7.0)
Abs Lymphocytes: 2.1 10*3/uL (ref 0.7–3.1)
BASOPHILS: 0 %
EOSINOPHILS: 7 %
HCT: 39.8 % (ref 37.5–51.0)
HGB: 13.5 g/dL (ref 12.6–17.7)
IMMATURE GRANULOCYTES: 0 %
Lymphocytes: 34 %
MCH: 28.4 pg (ref 26.6–33.0)
MCHC: 33.9 g/dL (ref 31.5–35.7)
MCV: 84 fL (ref 79–97)
MONOCYTES: 6 %
NEUTROPHILS: 53 %
PLATELET: 380 10*3/uL — ABNORMAL HIGH (ref 150–379)
RBC: 4.75 x10E6/uL (ref 4.14–5.80)
RDW: 13.9 % (ref 12.3–15.4)
WBC: 6.1 10*3/uL (ref 3.4–10.8)

## 2015-08-29 NOTE — Progress Notes (Signed)
CBC has returned to normal (platelets are normal now).

## 2015-08-29 NOTE — Progress Notes (Signed)
Pt daughter Varney Biles returned call, relayed result note, she is on phi. No further questions.

## 2015-08-29 NOTE — Progress Notes (Signed)
Left vm for patient on his confidential self-identified cell phone # with the lab result note and to call if he has further questions.

## 2015-09-04 ENCOUNTER — Encounter

## 2015-09-05 ENCOUNTER — Ambulatory Visit: Admit: 2015-09-05 | Discharge: 2015-09-05 | Payer: MEDICARE | Attending: Family | Primary: Internal Medicine

## 2015-09-05 DIAGNOSIS — I10 Essential (primary) hypertension: Secondary | ICD-10-CM

## 2015-09-05 MED ORDER — AMLODIPINE 5 MG TAB
5 mg | ORAL_TABLET | ORAL | 3 refills | Status: DC
Start: 2015-09-05 — End: 2015-10-28

## 2015-09-05 MED ORDER — AMLODIPINE 5 MG TAB
5 mg | ORAL_TABLET | ORAL | 0 refills | Status: DC
Start: 2015-09-05 — End: 2015-09-05

## 2015-09-05 MED ORDER — OMEPRAZOLE 40 MG CAP, DELAYED RELEASE
40 mg | ORAL_CAPSULE | Freq: Every day | ORAL | 6 refills | Status: DC
Start: 2015-09-05 — End: 2016-08-21

## 2015-09-05 MED ORDER — ASPIRIN 81 MG TAB, DELAYED RELEASE
81 mg | ORAL_TABLET | Freq: Every day | ORAL | 3 refills | Status: DC
Start: 2015-09-05 — End: 2016-08-28

## 2015-09-05 MED ORDER — LISINOPRIL-HYDROCHLOROTHIAZIDE 20 MG-25 MG TAB
20-25 mg | ORAL_TABLET | ORAL | 3 refills | Status: DC
Start: 2015-09-05 — End: 2015-10-28

## 2015-09-05 MED ORDER — LOVASTATIN 20 MG TAB
20 mg | ORAL_TABLET | Freq: Two times a day (BID) | ORAL | 11 refills | Status: DC
Start: 2015-09-05 — End: 2015-10-28

## 2015-09-05 MED ORDER — LISINOPRIL-HYDROCHLOROTHIAZIDE 20 MG-25 MG TAB
20-25 mg | ORAL_TABLET | ORAL | 0 refills | Status: DC
Start: 2015-09-05 — End: 2015-09-05

## 2015-09-05 NOTE — Progress Notes (Signed)
Assessment/Plan:       ICD-10-CM ICD-9-CM    1. Essential hypertension with goal blood pressure less than 130/80 I10 401.9 amLODIPine (NORVASC) 5 mg tablet      lisinopril-hydroCHLOROthiazide (PRINZIDE, ZESTORETIC) 20-25 mg per tablet      aspirin delayed-release 81 mg tablet   2. Pure hypercholesterolemia E78.00 272.0 lovastatin (MEVACOR) 20 mg tablet   3. Gastrointestinal hemorrhage associated with other gastritis K29.61  omeprazole (PRILOSEC) 40 mg capsule     Follow-up Disposition:  Return in about 2 months (around 11/06/2015).    Subjective:   Damon [name] is a 64 y.o. male who presents for follow up of   Chief Complaint   Patient presents with   ??? Hypertension     follow up    Will see surgeon again May 4, doing well.  He had run out of lisin/hctz 20/25 for 1 week.  He also  has a past medical history of Aneurysm ([place]) (2016); Aneurysm ([place]) (2016); GERD (gastroesophageal reflux disease); HTN (hypertension); and Hyperlipidemia..  Mobile, not outside much due to the weather. Daughter tells me the surgeon did some testing on his circulation and it is not doing as well as it should.  Recommendation: exercise.  He doesn't exercise.  Measuring blood pressures outside of clinic: Yes: Comment: [id number] systolic during the week he was out of blood pressure medication 140  Last took medication: amlodipine, today  Physical activity: no  Trying to eat low sodium diet: yes  Family History: family history includes Diabetes in his father and paternal uncle; Heart Disease in his brother, father, and sister; Hypertension in his brother, father, mother, sister, and sister; Kidney Disease in his sister and sister.  Social History:  reports that he has quit smoking. His smoking use included Cigarettes. He started smoking about 47 years ago. He has a 22.00 pack-year smoking history. He quit smokeless tobacco use about 3 months ago. He reports that he does not drink alcohol or use illicit drugs.   Cardiovascular ROS: no chest pain on exertion, no dyspnea on exertion, no swelling of ankles.   Objective:     Vitals:    09/05/15 0914 09/05/15 0920   BP: 149/90 145/85   Pulse: 65 (!) 5   Temp: 98.4 ??F (36.9 ??C)    TempSrc: Oral    Weight: 180 lb (81.6 kg)    Height: 5\' 10"  (1.778 m)      Wt Readings from Last 2 Encounters:   09/05/15 180 lb (81.6 kg)   07/24/15 173 lb (78.5 kg)     Clinical Support on 08/28/2015   Component Date Value Ref Range Status   ??? WBC 08/28/2015 6.1  3.4 - 10.8 x10E3/uL Final   ??? RBC 08/28/2015 4.75  4.14 - 5.80 x10E6/uL Final   ??? HGB 08/28/2015 13.5  12.6 - 17.7 g/dL Final   ??? HCT 08/28/2015 39.8  37.5 - 51.0 % Final   ??? MCV 08/28/2015 84  79 - 97 fL Final   ??? MCH 08/28/2015 28.4  26.6 - 33.0 pg Final   ??? MCHC 08/28/2015 33.9  31.5 - 35.7 g/dL Final   ??? RDW 08/28/2015 13.9  12.3 - 15.4 % Final   ??? PLATELET 08/28/2015 380* 150 - 379 x10E3/uL Final   ??? NEUTROPHILS 08/28/2015 53  % Final   ??? Lymphocytes 08/28/2015 34  % Final   ??? MONOCYTES 08/28/2015 6  % Final   ??? EOSINOPHILS 08/28/2015 7  % Final   ???  BASOPHILS 08/28/2015 0  % Final   ??? ABS. NEUTROPHILS 08/28/2015 3.1  1.4 - 7.0 x10E3/uL Final   ??? Abs Lymphocytes 08/28/2015 2.1  0.7 - 3.1 x10E3/uL Final   ??? ABS. MONOCYTES 08/28/2015 0.4  0.1 - 0.9 x10E3/uL Final   ??? ABS. EOSINOPHILS 08/28/2015 0.5* 0.0 - 0.4 x10E3/uL Final   ??? ABS. BASOPHILS 08/28/2015 0.0  0.0 - 0.2 x10E3/uL Final   ??? IMMATURE GRANULOCYTES 08/28/2015 0  % Final   ??? ABS. IMM. GRANS. 08/28/2015 0.0  0.0 - 0.1 x10E3/uL Final     Labs discussed with patient.  Appearance: alert, well appearing, and in no distress.  General exam: CVS exam BP noted to be well controlled today in office, S1, S2 normal, no gallop, no murmur, chest clear, no JVD, no HSM, no edema.  Assessment/Plan:   Hypertension in fair control; has been out of lisin/hctz 20/25mg  for 1 week.   We discussed "moving more" to start at 5 minutes, watch for signs of  intermittent claudication as this is important for his care team to know about.  He continues to be a nonsmoker.  Follow up 2 months to check in with movement activities.  Damon [name] was seen today for hypertension.    Diagnoses and all orders for this visit:    Essential hypertension with goal blood pressure less than 130/80  -     amLODIPine (NORVASC) 5 mg tablet; TAKE ONE TABLET BY MOUTH ONCE DAILY FOR BLOOD PRESSURE  -     lisinopril-hydroCHLOROthiazide (PRINZIDE, ZESTORETIC) 20-25 mg per tablet; TAKE ONE TABLET BY MOUTH ONCE DAILY  -     aspirin delayed-release 81 mg tablet; Take 1 Tab by mouth daily.    Pure hypercholesterolemia  -     lovastatin (MEVACOR) 20 mg tablet; Take 2 Tabs by mouth two (2) times a day. For cholesterol    Gastrointestinal hemorrhage associated with other gastritis  -     omeprazole (PRILOSEC) 40 mg capsule; Take 1 Cap by mouth daily. For your stomach      Follow-up Disposition:  Return in about 2 months (around 11/06/2015).

## 2015-09-05 NOTE — Progress Notes (Signed)
Coordination of Care  1. Have you been to the ER, urgent care clinic since your last visit?  Hospitalized since your last visit? No    2. Have you seen or consulted any other health care providers outside of the [facility] since your last visit?  Include any pap smears or colon screening:  [facility] 07/2015    Medications  Medication Reconciliation Performed: yes  Patient does need refills     Learning Assessment Complete? yes

## 2015-10-28 ENCOUNTER — Ambulatory Visit: Admit: 2015-10-28 | Discharge: 2015-10-28 | Payer: MEDICARE | Attending: Family | Primary: Internal Medicine

## 2015-10-28 DIAGNOSIS — I10 Essential (primary) hypertension: Secondary | ICD-10-CM

## 2015-10-28 MED ORDER — AMLODIPINE 5 MG TAB
5 mg | ORAL_TABLET | ORAL | 3 refills | Status: DC
Start: 2015-10-28 — End: 2016-08-28

## 2015-10-28 MED ORDER — LOVASTATIN 20 MG TAB
20 mg | ORAL_TABLET | Freq: Two times a day (BID) | ORAL | 11 refills | Status: DC
Start: 2015-10-28 — End: 2015-10-28

## 2015-10-28 MED ORDER — LISINOPRIL-HYDROCHLOROTHIAZIDE 20 MG-25 MG TAB
20-25 mg | ORAL_TABLET | ORAL | 3 refills | Status: DC
Start: 2015-10-28 — End: 2016-08-28

## 2015-10-28 MED ORDER — LOVASTATIN 40 MG TAB
40 mg | ORAL_TABLET | Freq: Every evening | ORAL | 11 refills | Status: DC
Start: 2015-10-28 — End: 2016-08-21

## 2015-10-28 NOTE — ACP (Advance Care Planning) (Signed)
Patient invited to discuss ACP.  Given ACP materials.  Francisca December, NP

## 2015-10-28 NOTE — Progress Notes (Signed)
Coordination of Care  1. Have you been to the ER, urgent care clinic since your last visit?  Hospitalized since your last visit? No    2. Have you seen or consulted any other health care providers outside of the [facility] since your last visit?  Include any pap smears or colon screening. No    Medications  Medication Reconciliation Performed: yes  Patient does need refills     Learning Assessment Complete? yes

## 2015-10-28 NOTE — Progress Notes (Signed)
Assessment/Plan:       ICD-10-CM ICD-9-CM    1. Essential hypertension with goal blood pressure less than 130/80 I10 401.9 amLODIPine (NORVASC) 5 mg tablet      lisinopril-hydroCHLOROthiazide (PRINZIDE, ZESTORETIC) 20-25 mg per tablet   2. Pure hypercholesterolemia E78.00 272.0 lovastatin (MEVACOR) 40 mg tablet      DISCONTINUED: lovastatin (MEVACOR) 20 mg tablet     Follow-up Disposition:  Return for honoring choices and medicare well exam discussed.    Subjective:   Damon [name] is a 65 y.o. male who presents for follow up of   Chief Complaint   Patient presents with   ??? Hypertension     hypertension recheck  +  medication refill   He has been back on his lisinopril/hctz.  Nonsmoker since September and remains nonsmoker.  Has not been moving more.  Waiting for the temperature to be warmer.  I asked him what warmer was and he said 60 degrees.  Discussed the weather forecast for today was to be up to 60 degrees.  Still not ready to start.  Discussed weight up 1 lb and this may help.  Praised for bp being much better; also, feeling well.  He also  has a past medical history of Aneurysm ([place]) (2016); Aneurysm ([place]) (2016); GERD (gastroesophageal reflux disease); HTN (hypertension); and Hyperlipidemia..  Measuring blood pressures outside of clinic: yes,120's  Last took medication: today  Physical activity: no  Trying to eat low sodium diet: yes.  His daughter does the cooking.  Family History: family history includes Diabetes in his father and paternal uncle; Heart Disease in his brother, father, and sister; Hypertension in his brother, father, mother, sister, and sister; Kidney Disease in his sister and sister.  Social History:  reports that he has quit smoking. His smoking use included Cigarettes. He started smoking about 47 years ago. He has a 22.00 pack-year smoking history. He quit smokeless tobacco use about 5 months ago. He reports that he does not drink alcohol or use illicit drugs.   Cardiovascular ROS: no chest pain on exertion, no dyspnea on exertion, no swelling of ankles.   Objective:     Vitals:    10/28/15 0902   BP: 135/66   Pulse: 63   Temp: 98.5 ??F (36.9 ??C)   TempSrc: Oral   Weight: 181 lb (82.1 kg)     Wt Readings from Last 2 Encounters:   10/28/15 181 lb (82.1 kg)   09/05/15 180 lb (81.6 kg)     No visits with results within 1 Month(s) from this visit.  Latest known visit with results is:    Clinical Support on 08/28/2015   Component Date Value Ref Range Status   ??? WBC 08/28/2015 6.1  3.4 - 10.8 x10E3/uL Final   ??? RBC 08/28/2015 4.75  4.14 - 5.80 x10E6/uL Final   ??? HGB 08/28/2015 13.5  12.6 - 17.7 g/dL Final   ??? HCT 08/28/2015 39.8  37.5 - 51.0 % Final   ??? MCV 08/28/2015 84  79 - 97 fL Final   ??? MCH 08/28/2015 28.4  26.6 - 33.0 pg Final   ??? MCHC 08/28/2015 33.9  31.5 - 35.7 g/dL Final   ??? RDW 08/28/2015 13.9  12.3 - 15.4 % Final   ??? PLATELET 08/28/2015 380* 150 - 379 x10E3/uL Final   ??? NEUTROPHILS 08/28/2015 53  % Final   ??? Lymphocytes 08/28/2015 34  % Final   ??? MONOCYTES 08/28/2015 6  % Final   ???  EOSINOPHILS 08/28/2015 7  % Final   ??? BASOPHILS 08/28/2015 0  % Final   ??? ABS. NEUTROPHILS 08/28/2015 3.1  1.4 - 7.0 x10E3/uL Final   ??? Abs Lymphocytes 08/28/2015 2.1  0.7 - 3.1 x10E3/uL Final   ??? ABS. MONOCYTES 08/28/2015 0.4  0.1 - 0.9 x10E3/uL Final   ??? ABS. EOSINOPHILS 08/28/2015 0.5* 0.0 - 0.4 x10E3/uL Final   ??? ABS. BASOPHILS 08/28/2015 0.0  0.0 - 0.2 x10E3/uL Final   ??? IMMATURE GRANULOCYTES 08/28/2015 0  % Final   ??? ABS. IMM. GRANS. 08/28/2015 0.0  0.0 - 0.1 x10E3/uL Final     Labs discussed with patient.  Appearance: alert, well appearing, and in no distress.  General exam: CVS exam BP noted to be well controlled today in office, S1, S2 normal, no gallop, no murmur, chest clear, no JVD, no HSM, no edema, fundi - normal.  Assessment/Plan:   Hypertension in good control.   Health Maintenance   Topic Date Due   ??? Hepatitis C Screening  May 24, 1951    ??? DTaP/Tdap/Td series (1 - Tdap) 12/11/1970   ??? FOBT Q 1 YEAR AGE 61-75  12/11/1999   ??? ZOSTER VACCINE AGE 64>  12/10/2009   ??? GLAUCOMA SCREENING Q2Y  12/11/2014   ??? Pneumococcal 65+ Low/Medium Risk (2 of 2 - PCV13) 04/17/2016   ??? MEDICARE YEARLY EXAM  04/18/2016   ??? INFLUENZA AGE 49 TO ADULT  Completed     Tyre was seen today for hypertension.    Diagnoses and all orders for this visit:    Essential hypertension with goal blood pressure less than 130/80  -     amLODIPine (NORVASC) 5 mg tablet; TAKE ONE TABLET BY MOUTH ONCE DAILY FOR BLOOD PRESSURE  -     lisinopril-hydroCHLOROthiazide (PRINZIDE, ZESTORETIC) 20-25 mg per tablet; TAKE ONE TABLET BY MOUTH ONCE DAILY    Pure hypercholesterolemia  -     Discontinue: lovastatin (MEVACOR) 20 mg tablet; Take 2 Tabs by mouth two (2) times a day. For cholesterol  -     lovastatin (MEVACOR) 40 mg tablet; Take 1 Tab by mouth nightly.      Follow-up Disposition:  Return for honoring choices and medicare well exam discussed.

## 2015-10-28 NOTE — ACP (Advance Care Planning) (Signed)
The provider spoke with the patient about ACP and gave him an information sheet. Rashia Mckesson Bea Graff, RN

## 2015-12-17 NOTE — ACP (Advance Care Planning) (Signed)
I called pt today for f/up and he has not reviewed ACP/ [facility] materials yet. He said he will let us know if he is interested in further discussion. I encouraged him to review materials and to feel free to ask further clarifying questions. I stressed to pt that we have trained facilitators available to schedule separate appointments to meet with pts to discuss in detail and that we can make this appointment for him at any time. Mancel Bale, RN

## 2016-08-19 ENCOUNTER — Encounter: Admit: 2016-08-19 | Discharge: 2016-08-19 | Payer: MEDICARE | Primary: Internal Medicine

## 2016-08-21 ENCOUNTER — Encounter

## 2016-08-21 MED ORDER — OMEPRAZOLE 40 MG CAP, DELAYED RELEASE
40 mg | ORAL_CAPSULE | ORAL | 3 refills | Status: DC
Start: 2016-08-21 — End: 2016-08-28

## 2016-08-21 MED ORDER — LOVASTATIN 40 MG TAB
40 mg | ORAL_TABLET | ORAL | 3 refills | Status: DC
Start: 2016-08-21 — End: 2016-08-28

## 2016-08-21 NOTE — Telephone Encounter (Signed)
Needs to come in to be seen and needs labs.

## 2016-08-25 NOTE — Telephone Encounter (Signed)
Spoke to the patient, reminded him to keep his appointment on 12/8

## 2016-08-26 ENCOUNTER — Encounter: Admit: 2016-08-26 | Discharge: 2016-08-26 | Payer: MEDICARE | Primary: Internal Medicine

## 2016-08-27 ENCOUNTER — Encounter: Primary: Internal Medicine

## 2016-08-28 ENCOUNTER — Ambulatory Visit: Admit: 2016-08-28 | Discharge: 2016-08-28 | Payer: MEDICARE | Attending: Family | Primary: Internal Medicine

## 2016-08-28 DIAGNOSIS — I739 Peripheral vascular disease, unspecified: Secondary | ICD-10-CM

## 2016-08-28 MED ORDER — LISINOPRIL-HYDROCHLOROTHIAZIDE 20 MG-25 MG TAB
20-25 mg | ORAL_TABLET | ORAL | 3 refills | Status: DC
Start: 2016-08-28 — End: 2017-10-19

## 2016-08-28 MED ORDER — OMEPRAZOLE 40 MG CAP, DELAYED RELEASE
40 mg | ORAL_CAPSULE | ORAL | 3 refills | Status: DC
Start: 2016-08-28 — End: 2017-10-19

## 2016-08-28 MED ORDER — TAMSULOSIN SR 0.4 MG 24 HR CAP
0.4 mg | ORAL_CAPSULE | Freq: Every day | ORAL | 3 refills | Status: DC
Start: 2016-08-28 — End: 2017-10-19

## 2016-08-28 MED ORDER — ASPIRIN 81 MG TAB, DELAYED RELEASE
81 mg | ORAL_TABLET | Freq: Every day | ORAL | 3 refills | Status: DC
Start: 2016-08-28 — End: 2017-10-19

## 2016-08-28 MED ORDER — LOVASTATIN 40 MG TAB
40 mg | ORAL_TABLET | ORAL | 3 refills | Status: DC
Start: 2016-08-28 — End: 2017-10-19

## 2016-08-28 MED ORDER — AMLODIPINE 5 MG TAB
5 mg | ORAL_TABLET | ORAL | 3 refills | Status: DC
Start: 2016-08-28 — End: 2017-10-19

## 2016-08-28 NOTE — Patient Instructions (Signed)
Current Outpatient Prescriptions   Medication Sig Dispense Refill   ??? lovastatin (MEVACOR) 40 mg tablet TAKE ONE TABLET BY MOUTH ONCE DAILY AT  NIGHT 90 Tab 3   ??? omeprazole (PRILOSEC) 40 mg capsule TAKE ONE CAPSULE BY MOUTH ONCE DAILY FOR  YOUR  STOMACH 90 Cap 3   ??? tamsulosin (FLOMAX) 0.4 mg capsule Take 1 Cap by mouth daily. 90 Cap 3   ??? amLODIPine (NORVASC) 5 mg tablet TAKE ONE TABLET BY MOUTH ONCE DAILY FOR BLOOD PRESSURE 90 Tab 3   ??? lisinopril-hydroCHLOROthiazide (PRINZIDE, ZESTORETIC) 20-25 mg per tablet TAKE ONE TABLET BY MOUTH ONCE DAILY 90 Tab 3   ??? aspirin delayed-release 81 mg tablet Take 1 Tab by mouth daily. 90 Tab 3

## 2016-08-28 NOTE — Progress Notes (Signed)
Assessment/Plan:       ICD-10-CM ICD-9-CM    1. Intermittent claudication (HCC) [id number] [id number] METABOLIC PANEL, COMPREHENSIVE   2. Need for hepatitis C screening test Z11.59 V73.89 HEPATITIS C AB   3. Encounter for FIT (fecal immunochemical test) screening Z12.11 V76.51 OCCULT BLOOD, IMMUNOASSAY (FIT)   4. Abdominal aortic aneurysm without rupture (HCC) I71.4 441.4    5. Pure hypercholesterolemia E78.00 272.0 lovastatin (MEVACOR) 40 mg tablet   6. Essential hypertension I10 401.9    7. Gastrointestinal hemorrhage associated with other gastritis K29.61 535.51 omeprazole (PRILOSEC) 40 mg capsule   8. Essential hypertension with goal blood pressure less than 130/80 I10 401.9 amLODIPine (NORVASC) 5 mg tablet      lisinopril-hydroCHLOROthiazide (PRINZIDE, ZESTORETIC) 20-25 mg per tablet      aspirin delayed-release 81 mg tablet   9. Benign prostatic hyperplasia with incomplete bladder emptying N40.1 600.01 tamsulosin (FLOMAX) 0.4 mg capsule    R39.14 788.21        [place] [place]  [place]  [place] [address]  Phone: [phone] Fax: [phone]    [place] ([address] [place] - [address]  [id number]  [place] ([place]) [place] [address]  Phone: [phone] Fax: [phone]      [facility]  Subjective:     Chief Complaint   Patient presents with   ??? Pre-op Exam     Needs labs drawn    Damon [name] is a 65 y.o. BL[place] OR [place] male. HPI:   He  has a past medical history of Aneurysm ([place]) (2016); Aneurysm ([place]) (2016); GERD (gastroesophageal reflux disease); HTN (hypertension); and Hyperlipidemia. He has Hypertension, GERD (gastroesophageal reflux disease), Hyperlipidemia, Abdominal aortic aneurysm without rupture ([place]), and GI bleed on his problem list.   Review of Systems: Negative for: fever, chest pain, shortness of breath, leg swelling, exertional dyspnea, palpitations.  Current Medications:    No current outpatient prescriptions on file prior to visit.     No current facility-administered medications on file prior to visit.    Can walk only a block calves hurt.  Surgery last September for blockages.    Past Surgical History: He  has a past surgical history that includes vascular surgery procedure unlist.   Social and Family History: He  reports that he has quit smoking. His smoking use included Cigarettes. He started smoking about 48 years ago. He has a 22.00 pack-year smoking history. He quit smokeless tobacco use about 15 months ago. He reports that he does not drink alcohol or use illicit drugs.; family history includes Diabetes in his father and paternal uncle; Heart Disease in his brother, father, and sister; Hypertension in his brother, father, mother, sister, and sister; Kidney Disease in his sister and sister.  Objective:     Vitals:    08/28/16 0840   BP: 135/82   Pulse: (!) 59   Temp: 98.8 ??F (37.1 ??C)   TempSrc: Oral   Weight: 195 lb 12.8 oz (88.8 kg)   Height: 5\' 9"  (1.753 m)    No LMP for male patient.   Wt Readings from Last 2 Encounters:   08/28/16 195 lb 12.8 oz (88.8 kg)   10/28/15 181 lb (82.1 kg)     No results found for any visits on 08/28/16.   Physical Examination:  General appearance - well developed, no acute distress. Weight gain noted.  Chest - clear  to auscultation.  Heart - regular rate and rhythm without murmurs, rubs, or gallops.  Abdomen - bowel sounds present x 4, NT, ND.  Extremities - pulses intact.  No peripheral edema. Skin is dry.  Assessment/Plan:   Diagnoses and all orders for this visit:    1. Intermittent claudication (HCC)  -     METABOLIC PANEL, COMPREHENSIVE; Future    2. Need for hepatitis C screening test  -     HEPATITIS C AB; Future    3. Encounter for FIT (fecal immunochemical test) screening  -     OCCULT BLOOD, IMMUNOASSAY (FIT); Future    4. Abdominal aortic aneurysm without rupture ([place])    5. Pure hypercholesterolemia   -     lovastatin (MEVACOR) 40 mg tablet; TAKE ONE TABLET BY MOUTH ONCE DAILY AT  NIGHT    6. Essential hypertension    7. Gastrointestinal hemorrhage associated with other gastritis  -     omeprazole (PRILOSEC) 40 mg capsule; TAKE ONE CAPSULE BY MOUTH ONCE DAILY FOR  YOUR  STOMACH    8. Essential hypertension with goal blood pressure less than 130/80  -     amLODIPine (NORVASC) 5 mg tablet; TAKE ONE TABLET BY MOUTH ONCE DAILY FOR BLOOD PRESSURE  -     lisinopril-hydroCHLOROthiazide (PRINZIDE, ZESTORETIC) 20-25 mg per tablet; TAKE ONE TABLET BY MOUTH ONCE DAILY  -     aspirin delayed-release 81 mg tablet; Take 1 Tab by mouth daily.    9. Benign prostatic hyperplasia with incomplete bladder emptying  -     tamsulosin (FLOMAX) 0.4 mg capsule; Take 1 Cap by mouth daily.      Follow-up Disposition:  Return for primary care needs (1 year of medication given today).   FIT test for Medicare patient (ordered already); needs lab test (creatinine, when available next week) and a list of his medications faxed to [facility] at [phone] (phone number [phone]) for a procedure being done at the end of the month  Francisca December, DNP, FNP-BC, BC-ADM  Alyson Locket Route expressed understanding of this plan.

## 2016-08-28 NOTE — Progress Notes (Signed)
Hep C nonreactive/negative.

## 2016-08-28 NOTE — Progress Notes (Signed)
Coordination of Care  1. Have you been to the ER, urgent care clinic since your last visit?  Hospitalized since your last visit? No    2. Have you seen or consulted any other health care providers outside of the [facility] since your last visit?  Include any pap smears or colon screening. No    Medications  Does the patient need refills? YES    Learning Assessment Complete? yes

## 2016-08-28 NOTE — Progress Notes (Addendum)
Damon [name] seen at d/c, given AVS and reviewed today's visit with patient.    FIT testing kit provided to patient, instructions were reviewed with patient along with contents of kit and how to mail kit in.    Med list faxed is to be faxed to a [facility] per order of Francisca December NP to [phone] prior to his surgery at end of month. (Note in nurse drawer at [facility])

## 2016-08-29 ENCOUNTER — Inpatient Hospital Stay: Admit: 2016-08-29 | Payer: MEDICARE | Primary: Internal Medicine

## 2016-08-29 LAB — METABOLIC PANEL, COMPREHENSIVE
A-G Ratio: 0.9 — ABNORMAL LOW (ref 1.1–2.2)
ALT (SGPT): 24 U/L (ref 12–78)
AST (SGOT): 20 U/L (ref 15–37)
Albumin: 3.8 g/dL (ref 3.5–5.0)
Alk. phosphatase: 107 U/L (ref 45–117)
Anion gap: 6 mmol/L (ref 5–15)
BUN/Creatinine ratio: 17 (ref 12–20)
BUN: 24 MG/DL — ABNORMAL HIGH (ref 6–20)
Bilirubin, total: 0.5 MG/DL (ref 0.2–1.0)
CO2: 30 mmol/L (ref 21–32)
Calcium: 9.5 MG/DL (ref 8.5–10.1)
Chloride: 104 mmol/L (ref 97–108)
Creatinine: 1.39 MG/DL — ABNORMAL HIGH (ref 0.70–1.30)
GFR est AA: >60 mL/min/{1.73_m2} (ref >60)
GFR est non-AA: 51 mL/min/{1.73_m2} — ABNORMAL LOW (ref >60)
Globulin: 4.1 g/dL — ABNORMAL HIGH (ref 2.0–4.0)
Glucose: 88 mg/dL (ref 65–100)
Potassium: 4.6 mmol/L (ref 3.5–5.1)
Protein, total: 7.9 g/dL (ref 6.4–8.2)
Sodium: 140 mmol/L (ref 136–145)

## 2016-08-29 NOTE — Progress Notes (Signed)
Please send lab results from 08/28/16 and a list of his current medications as per instructions in his office visit from 08/28/16.

## 2016-08-31 LAB — HEPATITIS C ANTIBODY: Hepatitis C Ab: NONREACTIVE

## 2016-08-31 LAB — HEPATITIS C AB: Hep C virus Ab Interp.: NONREACTIVE

## 2016-08-31 NOTE — Progress Notes (Signed)
Faxed labs and medication list per instructions of Catha Nottingham to Maudie Mercury, clinical coordinator for Dr. Dion Body to [phone]. Mancel Bale, RN

## 2016-09-05 LAB — OCCULT BLOOD IMMUNOASSAY,DIAGNOSTIC: Occult blood fecal, by IA: NEGATIVE

## 2016-09-05 LAB — PLEASE NOTE

## 2016-09-08 NOTE — Progress Notes (Signed)
Negative.

## 2017-10-19 ENCOUNTER — Encounter

## 2017-10-19 ENCOUNTER — Telehealth

## 2017-10-19 MED ORDER — ASPIRIN 81 MG TAB, DELAYED RELEASE
81 mg | ORAL_TABLET | Freq: Every day | ORAL | 3 refills | Status: DC
Start: 2017-10-19 — End: 2019-02-23

## 2017-10-19 MED ORDER — LISINOPRIL-HYDROCHLOROTHIAZIDE 20 MG-25 MG TAB
20-25 mg | ORAL_TABLET | ORAL | 3 refills | Status: DC
Start: 2017-10-19 — End: 2018-07-05

## 2017-10-19 MED ORDER — TAMSULOSIN SR 0.4 MG 24 HR CAP
0.4 mg | ORAL_CAPSULE | Freq: Every day | ORAL | 3 refills | Status: DC
Start: 2017-10-19 — End: 2018-07-05

## 2017-10-19 MED ORDER — LOVASTATIN 40 MG TAB
40 mg | ORAL_TABLET | ORAL | 3 refills | Status: DC
Start: 2017-10-19 — End: 2018-07-05

## 2017-10-19 MED ORDER — AMLODIPINE 5 MG TAB
5 mg | ORAL_TABLET | ORAL | 3 refills | Status: DC
Start: 2017-10-19 — End: 2018-07-05

## 2017-10-19 MED ORDER — OMEPRAZOLE 40 MG CAP, DELAYED RELEASE
40 mg | ORAL_CAPSULE | ORAL | 3 refills | Status: DC
Start: 2017-10-19 — End: 2018-07-05

## 2017-10-19 NOTE — Progress Notes (Signed)
[name] called for refills.  He said he had not been to pharmacy for a couple of months.  I told him that all of his prescriptions had been refilled on 12/8.  He is going to call [facility].    Damon [name] April

## 2017-10-19 NOTE — Telephone Encounter (Signed)
Refills sent to [facility].

## 2017-10-19 NOTE — Telephone Encounter (Signed)
Pharmacy called see orders x 6 and asked that pt bring in his insurance card to update information. Phone message left on pt identified voice mail.

## 2017-10-19 NOTE — Progress Notes (Signed)
[name], Damon [name], called again and said he had missed our call.  I called and left a message that his prescriptions had been called in to [facility] in [place].    [name]

## 2017-10-19 NOTE — Telephone Encounter (Signed)
Chart reviewed.

## 2017-10-19 NOTE — Telephone Encounter (Addendum)
[name], Damon [name], called earlier today to say he needed refills.  I told him hat we had refilled 6 prescriptions on December 8 at [facility] in [place] and suggested that he call the pharmacy.  He called back and said they had no record of receiving them.  I called the [facility] and they confirmed they have no record of having ever received them on December 8.    We have received requests from the pharmacy to refill some of them.    Patient is very anxious to get these refills today.    [name]    Patient called again to tell me he only has a couple of pills left on 2 of the prescriptions.    [name]

## 2017-11-09 MED ORDER — LISINOPRIL-HYDROCHLOROTHIAZIDE 20 MG-25 MG TAB
20-25 mg | ORAL_TABLET | ORAL | 3 refills | Status: DC
Start: 2017-11-09 — End: 2018-07-05

## 2017-11-09 MED ORDER — AMLODIPINE 5 MG TAB
5 mg | ORAL_TABLET | ORAL | 3 refills | Status: DC
Start: 2017-11-09 — End: 2018-07-05

## 2017-11-09 MED ORDER — LOVASTATIN 40 MG TAB
40 mg | ORAL_TABLET | ORAL | 3 refills | Status: DC
Start: 2017-11-09 — End: 2018-07-05

## 2018-05-03 ENCOUNTER — Other Ambulatory Visit: Payer: Self-pay

## 2018-05-03 ENCOUNTER — Emergency Department (INDEPENDENT_AMBULATORY_CARE_PROVIDER_SITE_OTHER): Payer: Medicare Other

## 2018-05-03 ENCOUNTER — Emergency Department
Admission: EM | Admit: 2018-05-03 | Discharge: 2018-05-03 | Disposition: A | Discharge status: Home / Self Care | Payer: Medicare Other | Source: Home / Self Care | Attending: Family Medicine | Admitting: Family Medicine

## 2018-05-03 DIAGNOSIS — S92001A Unspecified fracture of right calcaneus, initial encounter for closed fracture: Secondary | ICD-10-CM

## 2018-05-03 DIAGNOSIS — M79671 Pain in right foot: Secondary | ICD-10-CM | POA: Diagnosis not present

## 2018-05-03 DIAGNOSIS — W11XXXA Fall on and from ladder, initial encounter: Secondary | ICD-10-CM | POA: Diagnosis not present

## 2018-05-03 DIAGNOSIS — S93401A Sprain of unspecified ligament of right ankle, initial encounter: Secondary | ICD-10-CM | POA: Diagnosis not present

## 2018-05-03 DIAGNOSIS — S92021A Displaced fracture of anterior process of right calcaneus, initial encounter for closed fracture: Secondary | ICD-10-CM | POA: Diagnosis not present

## 2018-05-03 MED ORDER — HYDROCODONE-ACETAMINOPHEN 5-325 MG PO TABS
1.0000 | ORAL_TABLET | Freq: Four times a day (QID) | ORAL | 0 refills | Status: DC | PRN
Start: 2018-05-03 — End: 2018-05-18

## 2018-05-03 NOTE — ED Triage Notes (Signed)
Pt fell today from his ladder and twisted his ankle. Ankle swelling and pain level 8/10. Took tylenol shortly after it happened.

## 2018-05-03 NOTE — Discharge Instructions (Addendum)
Elevate foot/leg.  Apply ice pack for 20 to 30 minutes, 3 to 4 times daily  Continue until pain and swelling decrease. Wear cam walker.  Use crutches. Call office of Dr. Rodney Langtonhomas Thekkekandam or Dr. Clementeen GrahamEvan Corey ([facility]) Wednesday morning 05/04/18 for follow-up appointment.

## 2018-05-03 NOTE — ED Provider Notes (Signed)
[facility]    CSN: [id number] Arrival date & time: 05/03/18  1854     History   Chief Complaint Chief Complaint  Patient presents with  . Ankle Pain    and Swelling    HPI Jose Fernandez is a 67 y.o. male.   Patient fell from a ladder about 8 hours ago.  He fell straight down about 4 feet, landing firmly on his right foot.  He has had persistent pain/swelling in his right foot and ankle  The history is provided by the patient.  Ankle Pain  Location:  Ankle and foot Time since incident:  8 hours Injury: yes   Mechanism of injury: fall   Fall:    Fall occurred:  From a ladder   Height of fall:  4 feet   Impact surface: hard surface.   Point of impact:  Feet   Entrapped after fall: no   Ankle location:  R ankle Foot location:  R foot Pain details:    Quality:  Aching and sharp   Radiates to:  Does not radiate   Severity:  Severe   Onset quality:  Sudden   Duration:  8 hours   Timing:  Constant   Progression:  Unchanged Chronicity:  New Foreign body present:  No foreign bodies Prior injury to area:  No Relieved by:  Nothing Worsened by:  Bearing weight Ineffective treatments:  Acetaminophen Associated symptoms: decreased ROM, stiffness and swelling   Associated symptoms: no back pain, no muscle weakness, no numbness and no tingling     History reviewed. No pertinent past medical history.  There are no active problems to display for this patient.   History reviewed. No pertinent surgical history.     Home Medications    Prior to Admission medications   Medication Sig Start Date End Date Taking? Authorizing Provider  amLODipine (NORVASC) 5 MG tablet Take 5 mg by mouth daily.   Yes [provider]  aspirin EC 81 MG tablet Take 81 mg by mouth daily.   Yes [provider]  lisinopril-hydrochlorothiazide (PRINZIDE,ZESTORETIC) 20-25 MG tablet Take 1 tablet by mouth daily.   Yes [provider]  lovastatin (MEVACOR) 40  MG tablet Take 40 mg by mouth at bedtime.   Yes [provider]  omeprazole (PRILOSEC) 40 MG capsule Take 40 mg by mouth daily.   Yes [provider]  tamsulosin (FLOMAX) 0.4 MG CAPS capsule Take 0.4 mg by mouth.   Yes [provider]  HYDROcodone-acetaminophen (NORCO/VICODIN) 5-325 MG tablet Take 1 tablet by mouth every 6 (six) hours as needed for moderate pain. 05/03/18   Lattie HawBeese, Kaliyah Gladman A, MD    Family History Family History  Family history unknown: Yes    Social History Social History   Tobacco Use  . Smoking status: Never Smoker  . Smokeless tobacco: Never Used  Substance Use Topics  . Alcohol use: Never    Frequency: Never  . Drug use: Never     Allergies   Patient has no known allergies.   Review of Systems Review of Systems  Musculoskeletal: Positive for stiffness. Negative for back pain.  All other systems reviewed and are negative.    Physical Exam Triage Vital Signs ED Triage Vitals  Enc Vitals Group     BP 05/03/18 1940 138/76     Pulse Rate 05/03/18 1940 71     Resp --      Temp --      Temp src --  SpO2 05/03/18 1940 97 %     Weight 05/03/18 1944 186 lb (84.4 kg)     Height 05/03/18 1944 5\' 10"  (1.778 m)     Head Circumference --      Peak Flow --      Pain Score 05/03/18 1944 8     Pain Loc --      Pain Edu? --      Excl. in GC? --    No data found.  Updated Vital Signs BP 138/76 (BP Location: Right Arm)   Pulse 71   Ht 5\' 10"  (1.778 m)   Wt 84.4 kg   SpO2 97%   BMI 26.69 kg/m   Visual Acuity Right Eye Distance:   Left Eye Distance:   Bilateral Distance:    Right Eye Near:   Left Eye Near:    Bilateral Near:     Physical Exam  Constitutional: He appears well-developed and well-nourished. No distress.  HENT:  Head: Atraumatic.  Right Ear: External ear normal.  Left Ear: External ear normal.  Nose: Nose normal.  Mouth/Throat: Oropharynx is clear and moist.  Eyes: Pupils are equal, round, and  reactive to light.  Neck: Normal range of motion.  Cardiovascular: Normal rate.  Pulmonary/Chest: Effort normal.  Musculoskeletal:       Right ankle: He exhibits decreased range of motion and swelling. He exhibits no ecchymosis, no deformity, no laceration and normal pulse. Tenderness. Lateral malleolus tenderness found. Achilles tendon normal.       Right foot: There is tenderness and bony tenderness.       Feet:   Right ankle:  Decreased range of motion.  Tenderness and swelling over the lateral malleolus.  Joint stable.  No tenderness over the base of the fifth metatarsal.  Distal neurovascular function is intact.  Right foot:  Tenderness over calcaneus   Neurological: He is alert.  Skin: Skin is warm and dry.  Nursing note and vitals reviewed.    UC Treatments / Results  Labs (all labs ordered are listed, but only abnormal results are displayed) Labs Reviewed - No data to display  EKG None  Radiology Dg Ankle Complete Right  Result Date: 05/03/2018 CLINICAL DATA:  Fall from ladder EXAM: RIGHT ANKLE - COMPLETE 3+ VIEW COMPARISON:  05/03/2018 FINDINGS: Ankle mortise is symmetric. There is lateral soft tissue swelling. Irregular lucencies within the anterior and mid calcaneus, suspect for calcaneal fracture. IMPRESSION: Acute slightly impacted calcaneus fracture. Electronically Signed   By: Jasmine Pang M.D.   On: 05/03/2018 20:46   Dg Foot Complete Right  Result Date: 05/03/2018 CLINICAL DATA:  Pain after fall from ladder EXAM: RIGHT FOOT COMPLETE - 3+ VIEW COMPARISON:  None. FINDINGS: Frontal, oblique, and lateral views were obtained. There is no appreciable fracture or dislocation. There is mild hallux valgus deformity at the first MTP joint. There is mild osteoarthritic change in the first MTP joint. Other joint spaces appear normal. No erosive change. Arterial vascular calcification is noted between the first and second metatarsals. 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 Other joint spaces appear unremarkable. No evident fracture or dislocation. Arterial vascular calcification between the first and second metatarsals noted. Electronically Signed   By: Bretta Bang III M.D.   On: 05/03/2018 19:53    Procedures Procedures (including critical care time)  Medications Ordered in UC Medications - No data to display  Initial Impression / Assessment and Plan /  UC Course  I have reviewed the triage vital signs and the nursing notes.  Pertinent labs & imaging results that were available during my care of the patient were reviewed by me and considered in my medical decision making (see chart for details).    Cam walker applied.  Dispensed crutches. Rx for Lortab (#15, no refill) Controlled Substance Prescriptions I have consulted the [place] Controlled Substances Registry for this patient, and feel the risk/benefit ratio today is favorable for proceeding with this prescription for a controlled substance.  Refer to Dr. Rodney Langtonhomas Thekkekandam or Dr. Clementeen GrahamEvan Corey ([facility]) for fracture management and follow-up.   Final Clinical Impressions(s) / UC Diagnoses   Final diagnoses:  Sprain of right ankle, unspecified ligament, initial encounter  Closed nondisplaced fracture of right calcaneus, unspecified portion of calcaneus, initial encounter     Discharge Instructions     Elevate foot/leg.  Apply ice pack for 20 to 30 minutes, 3 to 4 times daily  Continue until pain and swelling decrease. Wear cam walker.  Use crutches. Call office of Dr. Rodney Langtonhomas Thekkekandam or Dr. Clementeen GrahamEvan Corey ([facility]) Wednesday morning 05/04/18 for follow-up appointment.    ED Prescriptions    Medication Sig Dispense Auth. Provider   HYDROcodone-acetaminophen (NORCO/VICODIN) 5-325 MG tablet Take 1 tablet by mouth every 6 (six) hours as needed for moderate pain. 15 tablet Lattie HawBeese, Azan Maneri A, MD          Lattie HawBeese, Lakishia Bourassa A, MD 05/06/18 [phone]

## 2018-05-04 ENCOUNTER — Encounter: Payer: Self-pay | Admitting: Family Medicine

## 2018-05-04 ENCOUNTER — Ambulatory Visit (INDEPENDENT_AMBULATORY_CARE_PROVIDER_SITE_OTHER): Payer: Medicare Other | Admitting: Family Medicine

## 2018-05-04 VITALS — BP 131/74 | HR 96

## 2018-05-04 DIAGNOSIS — S92011A Displaced fracture of body of right calcaneus, initial encounter for closed fracture: Secondary | ICD-10-CM | POA: Diagnosis not present

## 2018-05-04 DIAGNOSIS — E785 Hyperlipidemia, unspecified: Secondary | ICD-10-CM

## 2018-05-04 HISTORY — DX: Hyperlipidemia, unspecified: E78.5

## 2018-05-04 NOTE — Patient Instructions (Signed)
Thank you for coming in today. Keep the weight off the foot.  Wear the cast boot all the time.  You should hear about CT scan soon.  Let me know if you do not hear anything   Recheck with me in 2 weeks or sooner if needed.    Calcaneal Fracture Repair Surgery Calcaneal fracture repair surgery is a procedure to repair a broken heel bone (calcaneus). This fracture may result from having too much weight on the calcaneus, such as from a fall or car crash. The fracture may make the calcaneus wider and shorter. The goal of calcaneal fracture repair surgery is to restore the shape and function of the calcaneus. You may need this procedure if your calcaneus is crushed or moved out of its normal position. Depending on your injury, you may have surgery right after your injury, or you may need to wait several weeks for swelling to go down. During the procedure, screws are used to repair the fracture and hold the bone in place. If there are only a few large bone pieces that are not far apart, the screws may be placed through small incisions in the heel (closed reduction andpercutaneous screw fixation). If the bone has been crushed or is in many pieces, wires or plates and screws may be placed through a large incision on the side of the heel (open reduction with internal fixation, ORIF). Both procedures require a long recovery period and physical therapy after surgery. Tell a health care provider about:  Any allergies you have.  All medicines you are taking, including vitamins, herbs, eye drops, creams, and over-the-counter medicines.  Any problems you or family members have had with anesthetic medicines.  Any blood disorders you have.  Any surgeries you have had.  Any medical conditions you have.  Whether you are pregnant or may be pregnant.  Any smoking or tobacco use. What are the risks? Generally, this is a safe procedure. However, problems may occur,  including:  Infection.  Bleeding.  Allergic reactions to medicines.  Damage to other structures or organs, such as nerves, blood vessels, or tissues that connect muscle to bone (tendons).  Long-term stiffness, swelling, and pain (arthritis).  Long-term limp or heel pain.  Delayed wound healing.  Failure of the procedure.  What happens before the procedure? Staying hydrated Follow instructions from your health care provider about hydration, which may include:  Up to 2 hours before the procedure - you may continue to drink clear liquids, such as water, clear fruit juice, black coffee, and plain tea.  Eating and drinking restrictions Follow instructions from your health care provider about eating and drinking, which may include:  8 hours before the procedure - stop eating heavy meals or foods such as meat, fried foods, or fatty foods.  6 hours before the procedure - stop eating light meals or foods, such as toast or cereal.  6 hours before the procedure - stop drinking milk or drinks that contain milk.  2 hours before the procedure - stop drinking clear liquids.  Medicines Ask your health care provider about:  Changing or stopping your regular medicines. This is especially important if you are taking diabetes medicines or blood thinners.  Taking medicines such as aspirin and ibuprofen. These medicines can thin your blood. Do not take these medicines before your procedure if your health care provider instructs you not to.  General instructions  You may have imaging tests, such as X-rays or a CT scan.  If you smoke,  it is important to quit before surgery. If you need help quitting, ask your health care provider.  Plan to have someone take you home from the hospital or clinic.  If you will be going home right after the procedure, plan to have someone with you for 24 hours.  Arrange for someone to help you with daily activities while you recover. What happens during the  procedure?  To reduce your risk of infection: ? Your health care team will wash or sanitize their hands. ? Your skin will be washed with soap.  An IV tube will be inserted into one of your veins.  You will be given one or more of the following: ? A medicine to help you relax (sedative). ? A medicine to numb the area (local anesthetic). ? A medicine to make you fall asleep (general anesthetic). ? A medicine that is injected into an area of your body to numb everything below the injection site (regional anesthetic).  If your surgeon uses the closed reduction and percutaneous screw fixation method: ? Small incisions may be made near your fracture. ? Broken bone pieces will be put back in their original position. ? Screws will be placed through your skin and into your bone. The screws will hold the bone pieces in a normal position for healing. ? X-rays may be taken during the procedure to check the position of the bones and the screws.  If your surgeon uses the ORIF method: ? An L-shaped incision may be made over the side of your calcaneus. ? Your skin will be lifted away from the bone. Nerves, blood vessels, and muscle attachments will be moved out of the way. ? Wires or plates and screws will be placed to hold the bone pieces in a normal position for healing.  Your incision(s) will be closed with stitches (sutures).  A bandage (dressing) may be applied to your incision(s).  A splint or cast may be placed on your foot. The procedure may vary among health care providers and hospitals. What happens after the procedure?  Your blood pressure, heart rate, breathing rate, and blood oxygen level will be monitored until the medicines you were given have worn off.  Your foot may be raised (elevated) to help reduce swelling.  You will be given medicine to help relieve pain as needed.  You may need to wear a splint or boot on your injured foot.  You will be given instructions  about: ? When it is safe to move your foot. ? When it is safe to put weight on your foot. ? When to start physical therapy exercises.  You may be given a walker, crutches, or a cane to help with walking.  Do not drive for 24 hours if you were given a sedative. This information is not intended to replace advice given to you by your health care provider. Make sure you discuss any questions you have with your health care provider. Document Released: 06/17/2005 Document Revised: 06/02/2016 Document Reviewed: 06/02/2016 Elsevier Interactive Patient Education  [employer]2018 [employer]

## 2018-05-04 NOTE — Progress Notes (Signed)
Subjective:    CC: Calcaneus fracture  HPI: Jose Fernandez was in his normal state of health on August 13 when he fell about 4 to 5 feet off of a ladder onto the ground.  He injured his right foot and was seen in urgent care where he was found to have a minimally to nondisplaced fracture of his calcaneus at the os calcis as well as the styloid.  He was treated with a Cam walker boot and nonweightbearing with crutches.  He notes this is quite helpful.  He notes when not weightbearing using the boot he does not have much pain but as soon as he tries to weight-bear even in the boot he will hurt.  He has been using prescription strength NSAIDs which do help.  He was prescribed Norco which helps a bit.  He denies any radiating pain weakness or numbness fevers or chills.  He typically lives in [place] but is going to [place] in the [place] area for a few weeks or months.  Past medical history, Surgical history, Family history not pertinant except as noted below, Social history, Allergies, and medications have been entered into the medical record, reviewed, and no changes needed.   Review of Systems: No headache, visual changes, nausea, vomiting, diarrhea, constipation, dizziness, abdominal pain, skin rash, fevers, chills, night sweats, weight loss, swollen lymph nodes, body aches, joint swelling, muscle aches, chest pain, shortness of breath, mood changes, visual or auditory hallucinations.   Objective:    Vitals:   05/04/18 1011  BP: 131/74  Pulse: 96   General: Well Developed, well nourished, and in no acute distress.  Neuro/Psych: Alert and oriented x3, extra-ocular muscles intact, able to move all 4 extremities, sensation grossly intact. Skin: Warm and dry, no rashes noted.  Respiratory: Not using accessory muscles, speaking in full sentences, trachea midline.  Cardiovascular: Pulses palpable, no extremity edema. Abdomen: Does not appear distended. MSK:  Right foot slightly  swollen with ecchymosis along the ankle laterally and into the calcaneus.  Tender palpation at the lateral malleolus and into the plantar calcaneus and hindfoot and midfoot.  Excellent ankle motion not tested.  Pulses capillary refill and sensation are intact distally.  Lab and Radiology Results No results found for this or any previous visit (from the past 72 hour(s)). Dg Ankle Complete Right  Result Date: 05/03/2018 CLINICAL DATA:  Fall from ladder EXAM: RIGHT ANKLE - COMPLETE 3+ VIEW COMPARISON:  05/03/2018 FINDINGS: Ankle mortise is symmetric. There is lateral soft tissue swelling. Irregular lucencies within the anterior and mid calcaneus, suspect for calcaneal fracture. IMPRESSION: Acute slightly impacted calcaneus fracture. Electronically Signed   By: Jasmine PangKim  Fujinaga M.D.   On: 05/03/2018 20:46   Dg Foot Complete Right  Result Date: 05/03/2018 CLINICAL DATA:  Pain after fall from ladder EXAM: RIGHT FOOT COMPLETE - 3+ VIEW COMPARISON:  None. FINDINGS: Frontal, oblique, and lateral views were obtained. There is no appreciable fracture or dislocation. There is mild hallux valgus deformity at the first MTP joint. There is mild osteoarthritic change in the first MTP joint. Other joint spaces appear normal. No erosive change. Arterial vascular calcification is noted between the first and second metatarsals. 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 Other joint spaces appear unremarkable. No evident fracture or dislocation. Arterial vascular calcification between the first and second metatarsals noted. Electronically Signed   By: Bretta BangWilliam  Woodruff III M.D.   On: 05/03/2018 19:53  I personally (independently) visualized and performed the interpretation of the images attached in this note.   Impression and Recommendations:    Assessment and Plan: 67 y.o. male with  Fracture of the calcaneus likely in 2 locations.  Based on x-ray  images I am concerned he may have a comminuted fracture or a more severe injury than were seen on plain x-rays.  Plan for noncontrast CT scan.  In the meantime continue immobilization with Cam walker boot and nonweightbearing.  If he is not sufficiently controlled with a Cam walker boot will switch to a short leg non-walking cast over this is of noxious and we would both prefer to have a Cam walker boot.  Cautioned against driving while wearing the boot.  Recommend knee scooter as well.  Continue prescription strength Tylenol or NSAIDs.  Recheck 2 weeks.  Return sooner if needed..   Orders Placed This Encounter  Procedures  . CT FOOT RIGHT WO CONTRAST    Standing Status:   Future    Standing Expiration Date:   08/05/2019    Order Specific Question:   ** REASON FOR EXAM (FREE TEXT)    Answer:   Calcaneus fracture. Eval possible communation or displacment.    Order Specific Question:   Preferred imaging location?    Answer[facility]    Order Specific Question:   Radiology Contrast Protocol - do NOT remove file path    Answer:   [email or web address]   No orders of the defined types were placed in this encounter.   Discussed warning signs or symptoms. Please see discharge instructions. Patient expresses understanding.

## 2018-05-18 ENCOUNTER — Ambulatory Visit: Payer: Medicare Other | Admitting: Family Medicine

## 2018-05-18 ENCOUNTER — Encounter: Payer: Self-pay | Admitting: Family Medicine

## 2018-05-18 ENCOUNTER — Ambulatory Visit (INDEPENDENT_AMBULATORY_CARE_PROVIDER_SITE_OTHER): Payer: Medicare Other

## 2018-05-18 VITALS — BP 127/82 | HR 77 | Wt 173.0 lb

## 2018-05-18 DIAGNOSIS — S92011A Displaced fracture of body of right calcaneus, initial encounter for closed fracture: Secondary | ICD-10-CM

## 2018-05-18 DIAGNOSIS — S92011D Displaced fracture of body of right calcaneus, subsequent encounter for fracture with routine healing: Secondary | ICD-10-CM

## 2018-05-18 DIAGNOSIS — W11XXXD Fall on and from ladder, subsequent encounter: Secondary | ICD-10-CM

## 2018-05-18 NOTE — Patient Instructions (Addendum)
Thank you for coming in today. [facility].  Let me know if you do not hear anything.   [address]  , [place] [id number]  Phone: [phone]  Keep the weight off the foot and use the boot.   Let me know if you have any problems.

## 2018-05-18 NOTE — Progress Notes (Signed)
Jose Fernandez is a 67 y.o. male who presents to [facility] today for calcaneus fracture.  Patient was seen on August 14 for calcaneus fracture following a fall off a ladder.  The initial x-ray appeared to show some displacement of the fracture and a CT scan was ordered.  Patient was unaware that the CT scan was authorized and had been attempted to contact him to schedule it.  Usually lives in [place] but is done in this area for a few months.  His best contact detail on consultation with patient is the emergency contact at home phone number of [place] [phone].  She notes that he continues to experience pain in his heel.  He is using a Cam walker boot and nonweightbearing.  He notes when he does try to weight-bear the pain is worse.    ROS:  As above  Exam:  BP 127/82   Pulse 77   Wt 173 lb (78.5 kg)   BMI 24.82 kg/m  General: Well Developed, well nourished, and in no acute distress.  Neuro/Psych: Alert and oriented x3, extra-ocular muscles intact, able to move all 4 extremities, sensation grossly intact. Skin: Warm and dry, no rashes noted.  Respiratory: Not using accessory muscles, speaking in full sentences, trachea midline.  Cardiovascular: Pulses palpable, no extremity edema. Abdomen: Does not appear distended. MSK: X-ray left foot: Relatively normal-appearing with some swelling at the posterior calcaneus.  Tender to palpation.  Pulses capillary fill and sensation are intact distally.    Lab and Radiology Results Noncontrast CT scan of left foot obtained today. Images personally independently reviewed. CT scan shows a comminuted displaced angulated fracture of the calcaneus. Awaiting formal radiology review    Assessment and Plan: 67 y.o. male with displaced comminuted fracture of the calcaneus worse on CT scan appearance than on initial x-ray.  I think it is very likely that he will require surgical repair.  Plan to  continue nonweightbearing with Cam walker boot and refer to orthopedic surgery.  Discussed that if he is having any barriers or trouble getting an appointment or never hears anything he is to call my office soon and not wait very long.  Recheck with me as needed however I anticipate the bulk of his care will be transferred to orthopedics for surgery.  I spent 25 minutes with this patient, greater than 50% was face-to-face time counseling regarding ddx and plan.    Orders Placed This Encounter  Procedures  . DG Os Calcis Right    Standing Status:   Future    Standing Expiration Date:   07/19/2019    Order Specific Question:   Reason for Exam (SYMPTOM  OR DIAGNOSIS REQUIRED)    Answer:   f/u fx    Order Specific Question:   Preferred imaging location?    Answer:   [facility]    Order Specific Question:   Radiology Contrast Protocol - do NOT remove file path    Answer:   [email or web address]  . Ambulatory referral to Orthopedic Surgery    Referral Priority:   Routine    Referral Type:   Surgical    Referral Reason:   Specialty Services Required    Requested Specialty:   Orthopedic Surgery    Number of Visits Requested:   1   No orders of the defined types were placed in this encounter.   Historical information moved to improve visibility of documentation.  Past Medical History:  Diagnosis Date  .  Abdominal aortic aneurysm without rupture (HCC) 06/18/2015  . GERD (gastroesophageal reflux disease) 11/04/2011  . GI bleed 06/25/2015  . Hyperlipidemia 05/04/2018  . Hypertension 11/04/2011   Past Surgical History:  Procedure Laterality Date  . iliac, prior to percutaneous Endovascular Aortic Repair (EVAR)       Social History   Tobacco Use  . Smoking status: Former Smoker    Packs/day: 0.50    Years: 44.00    Pack years: 22.00    Types: Cigarettes    Last attempt to quit: 05/22/2015    Years since quitting: 2.9  . Smokeless tobacco:  Never Used  Substance Use Topics  . Alcohol use: Never    Frequency: Never   family history includes Diabetes in his father and paternal uncle; Heart disease in his sister; Hyperlipidemia in his brother; Hypertension in his brother, father, mother, and sister; Kidney disease in his sister.  Medications: Current Outpatient Medications  Medication Sig Dispense Refill  . amLODipine (NORVASC) 5 MG tablet Take 5 mg by mouth daily.    Marland Kitchen aspirin EC 81 MG tablet Take 81 mg by mouth daily.    Marland Kitchen lisinopril-hydrochlorothiazide (PRINZIDE,ZESTORETIC) 20-25 MG tablet Take 1 tablet by mouth daily.    Marland Kitchen lovastatin (MEVACOR) 40 MG tablet Take 40 mg by mouth at bedtime.    Marland Kitchen omeprazole (PRILOSEC) 40 MG capsule Take 40 mg by mouth daily.    . tamsulosin (FLOMAX) 0.4 MG CAPS capsule Take 0.4 mg by mouth.     No current facility-administered medications for this visit.    No Known Allergies    Discussed warning signs or symptoms. Please see discharge instructions. Patient expresses understanding.

## 2018-07-05 ENCOUNTER — Ambulatory Visit
Admit: 2018-07-05 | Discharge: 2018-07-05 | Payer: PRIVATE HEALTH INSURANCE | Attending: Nurse Practitioner | Primary: Internal Medicine

## 2018-07-05 ENCOUNTER — Ambulatory Visit: Attending: Nurse Practitioner | Primary: Internal Medicine

## 2018-07-05 DIAGNOSIS — Z7689 Persons encountering health services in other specified circumstances: Secondary | ICD-10-CM

## 2018-07-05 LAB — AMB POC HEMOGLOBIN A1C
Hemoglobin A1C, POC: 5.4 %
Hemoglobin A1c (POC): 5.4 %

## 2018-07-05 LAB — AMB POC LIPID PROFILE
Cholesterol (POC): 176
Cholesterol, POC: 176 NA
HDL Cholesterol (POC): 41
HDL Cholesterol, POC: 41 NA
LDL Cholesterol (POC): 90 MG/DL
LDL Cholesterol, POC: 90 MG/DL
Non-HDL Goal (POC): 135
Non-HDL Goal, POC: 135 NA
TChol/HDL Ratio (POC): 4.3
TChol/HDL Ratio (POC): 4.3 NA
Triglycerides (POC): 225
Triglycerides, POC: 225 NA

## 2018-07-05 LAB — AMB POC URINALYSIS DIP STICK AUTO W/O MICRO
Bilirubin (UA POC): NEGATIVE
Bilirubin, Urine, POC: NEGATIVE
Glucose (UA POC): NEGATIVE
Glucose, Urine, POC: NEGATIVE
Ketones (UA POC): NEGATIVE
Ketones, Urine, POC: NEGATIVE
Leukocyte Esterase, Urine, POC: NEGATIVE
Leukocyte esterase (UA POC): NEGATIVE
Nitrite, Urine, POC: NEGATIVE
Nitrites (UA POC): NEGATIVE
Protein (UA POC): NEGATIVE
Protein, Urine, POC: NEGATIVE
Specific Gravity, Urine, POC: 1.025 NA (ref 1.001–1.035)
Specific gravity (UA POC): 1.025 (ref 1.001–1.035)
Urobilinogen (UA POC): 0.2 (ref 0.2–1)
Urobilinogen, POC: 0.2 (ref 0.2–1)
pH (UA POC): 5.5 (ref 4.6–8.0)
pH, Urine, POC: 5.5 NA (ref 4.6–8.0)

## 2018-07-05 MED ORDER — TAMSULOSIN SR 0.4 MG 24 HR CAP
0.4 mg | ORAL_CAPSULE | Freq: Every day | ORAL | 3 refills | Status: DC
Start: 2018-07-05 — End: 2019-02-23

## 2018-07-05 MED ORDER — OMEPRAZOLE 40 MG CAP, DELAYED RELEASE
40 mg | ORAL_CAPSULE | ORAL | 3 refills | Status: DC
Start: 2018-07-05 — End: 2019-02-23

## 2018-07-05 MED ORDER — LOVASTATIN 40 MG TAB
40 mg | ORAL_TABLET | ORAL | 3 refills | Status: DC
Start: 2018-07-05 — End: 2019-02-23

## 2018-07-05 MED ORDER — AMLODIPINE 5 MG TAB
5 mg | ORAL_TABLET | ORAL | 3 refills | Status: DC
Start: 2018-07-05 — End: 2019-02-23

## 2018-07-05 MED ORDER — LISINOPRIL-HYDROCHLOROTHIAZIDE 20 MG-25 MG TAB
20-25 mg | ORAL_TABLET | ORAL | 3 refills | Status: DC
Start: 2018-07-05 — End: 2019-02-23

## 2018-07-05 NOTE — Progress Notes (Signed)
Chief Complaint   Patient presents with   ??? Establish Care   ??? Immunization/Injection     1. Have you been to the ER, urgent care clinic since your last visit?  Hospitalized since your last visit?Yes Reason for visit: heel injury    2. Have you seen or consulted any other health care providers outside of the [facility] since your last visit?  Include any pap smears or colon screening. No     Damon [name] is a 67 y.o. male  who presents for routine immunizations.   He denies any symptoms , reactions or allergies that would exclude them from being immunized today.  Risks and adverse reactions were discussed and the VIS was given to them. All questions were addressed.  He was observed for 5 min post injection. There were no reactions observed.    Wayna Chalet     3 most recent PHQ Screens 07/05/2018   Little interest or pleasure in doing things Not at all   Feeling down, depressed, irritable, or hopeless Not at all   Total Score PHQ 2 0     Abuse Screening Questionnaire 04/18/2015   Do you ever feel afraid of your partner? N   Are you in a relationship with someone who physically or mentally threatens you? N   Is it safe for you to go home? N     Fall Risk Assessment, last 12 mths 07/05/2018   Able to walk? Yes   Fall in past 12 months? Yes   Fall with injury? Yes   Number of falls in past 12 months 1   Fall Risk Score 2

## 2018-07-05 NOTE — Progress Notes (Signed)
Progress  Notes by Neta Ehlers A at 07/05/18 1030                Author: Neta Ehlers A  Service: --  Author Type: Nurse Practitioner       Filed: 07/05/18 2034  Encounter Date: 07/05/2018  Status: Signed          Editor: Delia Heady is a 67 y.o. male and presents with Establish Care   .   Subjective:            Damon [name] is a 67 y.o.  male and presents for annual Medicare Wellness Visit, and to establish care.   He has a hx of newly diagnosed prostate cancer and is considering options for treatment (urologist is Dr. Janifer Adie). He has a hx of aortic abdominal aneurysm in 2016. He fell off a ladder on 05/03/18 and fractured his right heel.      Hypertension Review:   The patient has essential hypertension. BP is 118/62.   Diet and Lifestyle: generally follows a  low sodium diet, exercises sporadically   Home BP Monitoring: is not measured at home.   Pertinent ROS: taking medications as instructed, no medication side effects noted, no TIA's, no chest pain on exertion, no  dyspnea on exertion, no swelling of ankles.    Problem List: Reviewed with patient and discussed risk factors.        GERD:  Stable.        Patient Active Problem List        Diagnosis  Code         ?  Hypertension  I10     ?  GERD (gastroesophageal reflux disease)  K21.9     ?  Hyperlipidemia  E78.5     ?  Abdominal aortic aneurysm without rupture (HCC)  I71.4         ?  GI bleed  K92.2           Current medical providers:  Patient Care Team:   Francisca December, NP as PCP - General (Nurse Practitioner)      PSH: Reviewed with patient     Past Surgical History:         Procedure  Laterality  Date          ?  HX ANGIOPLASTY              iliac, prior to percutaneous Endovascular Aortic Repair (EVAR)          ?  HX ORTHOPAEDIC    04/2018          heel surgery          ?  VASCULAR SURGERY PROCEDURE UNLIST    06/18/2015          ENdovas aneurysm repair            SH: Reviewed with patient     Social  History          Tobacco Use         ?  Smoking status:  Former Smoker              Packs/day:  0.50         Years:  44.00         Pack years:  22.00         Types:  Cigarettes  Start date:  12/11/1967         ?  Smokeless tobacco:  Former Systems developer              Quit date:  05/22/2015        ?  Tobacco comment: Pt. quit smoking 04/2015       Substance Use Topics         ?  Alcohol use:  No              Alcohol/week:  0.0 standard drinks             Comment: quit 05/22/2015         ?  Drug use:  No           FH: Reviewed with patient     Family History         Problem  Relation  Age of Onset          ?  Hypertension  Mother       ?  Hypertension  Sister       ?  Kidney Disease  Sister       ?  Hypertension  Brother       ?  Heart Disease  Brother       ?  Kidney Disease  Sister                on dialysis          ?  Heart Disease  Sister       ?  Hypertension  Sister       ?  Diabetes  Father       ?  Heart Disease  Father       ?  Hypertension  Father       ?  Diabetes  Paternal Uncle                deceased, 70's, heart trouble           Medications/Allergies: Reviewed with patient     Current Outpatient Medications on File Prior to Visit          Medication  Sig  Dispense  Refill           ?  aspirin delayed-release 81 mg tablet  Take 1 Tab by mouth daily.  90 Tab  3     ?  [DISCONTINUED] amLODIPine (NORVASC) 5 mg tablet  TAKE ONE TABLET BY MOUTH ONCE DAILY FOR BLOOD PRESSURE  90 Tab  3     ?  [DISCONTINUED] lisinopril-hydroCHLOROthiazide (PRINZIDE, ZESTORETIC) 20-25 mg per tablet  TAKE ONE TABLET BY MOUTH ONCE DAILY  90 Tab  3     ?  [DISCONTINUED] lovastatin (MEVACOR) 40 mg tablet  TAKE ONE TABLET BY MOUTH ONCE DAILY AT  NIGHT  90 Tab  3     ?  [DISCONTINUED] amLODIPine (NORVASC) 5 mg tablet  TAKE ONE TABLET BY MOUTH ONCE DAILY FOR BLOOD PRESSURE  90 Tab  3     ?  [DISCONTINUED] lisinopril-hydroCHLOROthiazide (PRINZIDE, ZESTORETIC) 20-25 mg per tablet  TAKE ONE TABLET BY MOUTH ONCE DAILY  90 Tab  3     ?   [DISCONTINUED] lovastatin (MEVACOR) 40 mg tablet  TAKE ONE TABLET BY MOUTH ONCE DAILY AT  NIGHT  90 Tab  3           ?  [DISCONTINUED] omeprazole (PRILOSEC) 40 mg capsule  TAKE ONE CAPSULE BY MOUTH ONCE DAILY  FOR  YOUR  STOMACH  90 Cap  3           ?  [DISCONTINUED] tamsulosin (FLOMAX) 0.4 mg capsule  Take 1 Cap by mouth daily.  90 Cap  3          No current facility-administered medications on file prior to visit.          No Known Allergies      Objective:   Visit Vitals      BP  118/62     Pulse  70     Temp  97.4 ??F (36.3 ??C) (Oral)     Resp  16     Ht  5\' 9"  (1.753 m)     Wt  193 lb 14.4 oz (88 kg)     SpO2  98%        BMI  28.63 kg/m??      Body mass index is 28.63 kg/m??.      Assessment of cognitive impairment: Alert and oriented x 3      Depression Screen:       3 most recent PHQ Screens  07/05/2018        Little interest or pleasure in doing things  Not at all     Feeling down, depressed, irritable, or hopeless  Not at all        Total Score PHQ 2  0        Depression Review:   Patient is seen for screen of depression,denies anhedonia, weight gain, insomnia, hypersomnia, psychomotor agitation, psychomotor retardation, fatigue, feelings of worthlessness/guilt, difficulty concentrating, hopelessness, impaired memory and recurrent  thoughts of death Treatment includes no medication    She denies recurrent thoughts of death and suicidal thoughts without plan.      Fall Risk Assessment:        Fall Risk Assessment, last 12 mths  07/05/2018        Able to walk?  Yes     Fall in past 12 months?  Yes     Fall with injury?  Yes     Number of falls in past 12 months  1        Fall Risk Score  2           Functional Ability:      Does the patient exhibit a steady gait?   yes     How long did it take the patient to get up and walk from a sitting position? seconds     Is the patient self reliant?  (ie can do own laundry, meals, household chores)   yes          Does the patient handle his/her own medications?   yes           Does the patient handle his/her own money?    yes        Is the patients home safe (ie good lighting, handrails on stairs and bath,  etc.)?   yes        Did you notice or did patient express any hearing difficulties?    no        Did you notice or did patient  express any vision difficulties?   no        Were distance and reading eye charts used?   no           Advance Care Planning:      Patient was offered the opportunity to  discuss advance care planning:   yes         Does patient have an Advance Directive:   no     If no, did you provide information on Caring Connections?   yes           Plan:          Orders Placed This Encounter        ?  INFLUENZA VIRUS VACCINE, HIGH DOSE SEASONAL, PRESERVATIVE FREE     ?  METABOLIC PANEL, COMPREHENSIVE     ?  CBC WITH AUTOMATED DIFF     ?  OCCULT BLOOD IMMUNOASSAY,DIAGNOSTIC     ?  REFERRAL TO GASTROENTEROLOGY     ?  AMB POC HEMOGLOBIN A1C     ?  AMB POC LIPID PROFILE     ?  AMB POC URINALYSIS DIP STICK AUTO W/O MICRO     ?  tamsulosin (FLOMAX) 0.4 mg capsule     ?  omeprazole (PRILOSEC) 40 mg capsule     ?  lovastatin (MEVACOR) 40 mg tablet     ?  lisinopril-hydroCHLOROthiazide (PRINZIDE, ZESTORETIC) 20-25 mg per tablet        ?  amLODIPine (NORVASC) 5 mg tablet             Health Maintenance        Topic  Date Due         ?  DTaP/Tdap/Td series (1 - Tdap)  12/11/1970     ?  Shingrix Vaccine Age 83> (1 of 2)  12/11/1999     ?  FOBT Q 1 YEAR AGE 51-75   09/03/2017     ?  Influenza Age 20 to Adult   04/21/2018     ?  MEDICARE YEARLY EXAM   07/06/2019     ?  GLAUCOMA SCREENING Q2Y   01/06/2020     ?  Hepatitis C Screening   Completed     ?  AAA Screening 41-68 YO Male Smoking Patients   Completed         ?  Pneumococcal 65+ years   Completed        Review of Systems   Constitutional: negative for fevers, chills, anorexia and weight loss   Eyes:   negative for visual disturbance and irritation   ENT:   negative for tinnitus,sore throat,nasal congestion,ear pains.hoarseness    Respiratory:  negative for cough, hemoptysis, dyspnea,wheezing   CV:   negative for chest pain, palpitations, lower extremity edema   GI:   negative for nausea, vomiting, diarrhea, abdominal pain,melena   Endo:               negative for polyuria,polydipsia,polyphagia,heat intolerance   Genitourinary: negative for frequency, dysuria and hematuria. HX of prostate cancer.   Integument:  negative for rash and pruritus   Hematologic:  negative for easy bruising and gum/nose bleeding   Musculoskel: negative for myalgias, arthralgias, back pain, muscle weakness, joint pain. Brace to right leg due to injury sustained in a fall from a ladder.   Neurological:  negative for headaches, dizziness, vertigo, memory problems and gait    Behavl/Psych: negative for feelings of anxiety, depression, mood changes        Past Medical History:        Diagnosis  Date         ?  Aneurysm ([place])  2016          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         ?  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  Cancer [facility])            prostate         ?  GERD (gastroesophageal reflux disease)       ?  HTN (hypertension)           ?  Hyperlipidemia            Past Surgical History:         Procedure  Laterality  Date          ?  HX ANGIOPLASTY              iliac, prior to percutaneous Endovascular Aortic Repair (EVAR)          ?  HX ORTHOPAEDIC    04/2018          heel surgery          ?  VASCULAR SURGERY PROCEDURE UNLIST    06/18/2015          ENdovas aneurysm repair          Social History          Socioeconomic History         ?  Marital status:  SINGLE              Spouse name:  Not on file         ?  Number of children:  Not on file     ?  Years of education:  Not on file     ?  Highest education level:  Not on file       Tobacco Use         ?  Smoking status:  Former Smoker              Packs/day:  0.50         Years:  44.00         Pack years:  22.00         Types:   Cigarettes         Start date:  12/11/1967         ?  Smokeless tobacco:  Former Systems developer              Quit date:  05/22/2015        ?  Tobacco comment: Pt. quit smoking 04/2015       Substance and Sexual Activity         ?  Alcohol use:  No              Alcohol/week:  0.0 standard drinks             Comment: quit 05/22/2015         ?  Drug use:  No          Family History         Problem  Relation  Age of Onset          ?  Hypertension  Mother       ?  Hypertension  Sister       ?  Kidney Disease  Sister       ?  Hypertension  Brother       ?  Heart Disease  Brother       ?  Kidney Disease  Sister                on dialysis          ?  Heart Disease  Sister       ?  Hypertension  Sister       ?  Diabetes  Father       ?  Heart Disease  Father       ?  Hypertension  Father       ?  Diabetes  Paternal Uncle                deceased, 35's, heart trouble          Current Outpatient Medications          Medication  Sig  Dispense  Refill           ?  tamsulosin (FLOMAX) 0.4 mg capsule  Take 1 Cap by mouth daily.  90 Cap  3     ?  omeprazole (PRILOSEC) 40 mg capsule  TAKE ONE CAPSULE BY MOUTH ONCE DAILY FOR  YOUR  STOMACH  90 Cap  3     ?  lovastatin (MEVACOR) 40 mg tablet  TAKE ONE TABLET BY MOUTH ONCE DAILY AT  NIGHT  90 Tab  3     ?  lisinopril-hydroCHLOROthiazide (PRINZIDE, ZESTORETIC) 20-25 mg per tablet  One tab by mouth daily  90 Tab  3     ?  amLODIPine (NORVASC) 5 mg tablet  TAKE ONE TABLET BY MOUTH ONCE DAILY FOR BLOOD PRESSURE  90 Tab  3           ?  aspirin delayed-release 81 mg tablet  Take 1 Tab by mouth daily.  90 Tab  3        No Known Allergies      Objective:   Visit Vitals      BP  118/62     Pulse  70     Temp  97.4 ??F (36.3 ??C) (Oral)     Resp  16     Ht  5\' 9"  (1.753 m)     Wt  193 lb 14.4 oz (88 kg)     SpO2  98%        BMI  28.63 kg/m??        Physical Exam:    General appearance - alert, well appearing, and in no distress   Mental status - alert, oriented to person, place, and time   EYE-PERLA, EOMI,  corneas normal, no foreign bodies   ENT-ENT exam normal, no neck nodes or sinus tenderness   Nose - normal and patent, no erythema, discharge or polyps   Mouth - mucous membranes moist, pharynx normal without lesions   Neck - supple, no significant adenopathy    Chest - clear to auscultation, no wheezes, rales or rhonchi, symmetric air entry    Heart - normal rate, regular rhythm, normal S1, S2, no murmurs, rubs, clicks or gallops    Abdomen - soft, nontender, nondistended, no masses or organomegaly   Lymph- no adenopathy palpable   Ext-peripheral pulses normal, no pedal edema, no clubbing or cyanosis. Right leg injury resulting insoft cast to the right leg due to fall from a ladder.   Skin-Warm and dry. no hyperpigmentation, vitiligo, or suspicious lesions   Neuro -alert, oriented, normal speech, no focal findings or movement disorder noted   Neck-normal C-spine, no tenderness, full ROM without pain  Feet-no nail deformities or callus formation with good pulses noted           Results for orders placed or performed in visit on 07/05/18     AMB POC HEMOGLOBIN A1C         Result  Value  Ref Range            Hemoglobin A1c (POC)  5.4  %       AMB POC LIPID PROFILE         Result  Value  Ref Range            Cholesterol (POC)  176         Triglycerides (POC)  225         HDL Cholesterol (POC)  41         LDL Cholesterol (POC)  90  MG/DL       Non-HDL Goal (POC)  135         TChol/HDL Ratio (POC)  4.3         AMB POC URINALYSIS DIP STICK AUTO W/O MICRO         Result  Value  Ref Range            Color (UA POC)  Amber         Clarity (UA POC)  Clear         Glucose (UA POC)  Negative  Negative       Bilirubin (UA POC)  Negative  Negative       Ketones (UA POC)  Negative  Negative       Specific gravity (UA POC)  1.025  1.001 - 1.035       Blood (UA POC)  Trace  Negative       pH (UA POC)  5.5  4.6 - 8.0       Protein (UA POC)  Negative  Negative       Urobilinogen (UA POC)  0.2 mg/dL  0.2 - 1       Nitrites (UA POC)   Negative  Negative            Leukocyte esterase (UA POC)  Negative  Negative           Assessment/Plan:             ICD-10-CM  ICD-9-CM             1.  Encounter to establish care with new doctor  Z76.89  V65.8  AMB POC HEMOGLOBIN A1C                AMB POC LIPID PROFILE           AMB POC URINALYSIS DIP STICK AUTO W/O MICRO           2.  Encounter for immunization  Z23  V03.89  INFLUENZA VIRUS VACCINE, HIGH DOSE SEASONAL, PRESERVATIVE FREE                CANCELED: INFLUENZA VIRUS VACCINE, HIGH DOSE SEASONAL, PRESERVATIVE FREE           3.  Mixed hyperlipidemia  E78.2  272.2  AMB POC LIPID PROFILE     4.  Essential hypertension  V42  595.6  METABOLIC PANEL, COMPREHENSIVE     5.  Prostate CA (HCC)  C61  185  CBC WITH AUTOMATED DIFF     6.  Physical exam, routine  Z00.00  V70.0  AMB POC HEMOGLOBIN A1C  AMB POC LIPID PROFILE           AMB POC URINALYSIS DIP STICK AUTO W/O MICRO           METABOLIC PANEL, COMPREHENSIVE           CBC WITH AUTOMATED DIFF           OCCULT BLOOD IMMUNOASSAY,DIAGNOSTIC           REFERRAL TO GASTROENTEROLOGY           7.  Medicare annual wellness visit, initial  Z00.00  V70.0       8.  Screening for diabetes mellitus  Z13.1  V77.1  AMB POC HEMOGLOBIN A1C     9.  Hx of abdominal aortic aneurysm  Z86.79  V12.59       10.  Impaired glucose tolerance (oral)   R73.02  790.22  AMB POC HEMOGLOBIN A1C     11.  Benign prostatic hyperplasia with incomplete bladder emptying  N40.1  600.01  tamsulosin (FLOMAX) 0.4 mg capsule            R39.14  788.21             12.  Gastrointestinal hemorrhage associated with other gastritis  K29.61  535.51  omeprazole (PRILOSEC) 40 mg capsule     13.  Pure hypercholesterolemia  E78.00  272.0  lovastatin (MEVACOR) 40 mg tablet     14.  Essential hypertension with goal blood pressure less than 130/80  I10  401.9  lisinopril-hydroCHLOROthiazide (PRINZIDE, ZESTORETIC) 20-25 mg per tablet                amLODIPine (NORVASC) 5 mg tablet           15.  Screen  for colon cancer  Z12.11  V76.51  OCCULT BLOOD IMMUNOASSAY,DIAGNOSTIC                REFERRAL TO GASTROENTEROLOGY          Orders Placed This Encounter        ?  INFLUENZA VIRUS VACCINE, HIGH DOSE SEASONAL, PRESERVATIVE FREE     ?  METABOLIC PANEL, COMPREHENSIVE     ?  CBC WITH AUTOMATED DIFF     ?  OCCULT BLOOD IMMUNOASSAY,DIAGNOSTIC     ?  REFERRAL TO GASTROENTEROLOGY              Referral Priority:    Routine         Referral Type:    Consultation         Referral Reason:    Specialty Services Required         Referred to Provider:    Inez Pilgrim, MD         Requested Specialty:    Gastroenterology        ?  AMB POC HEMOGLOBIN A1C     ?  AMB POC LIPID PROFILE     ?  AMB POC URINALYSIS DIP STICK AUTO W/O MICRO     ?  tamsulosin (FLOMAX) 0.4 mg capsule             Sig: Take 1 Cap by mouth daily.         Dispense:  90 Cap         Refill:  3        ?  omeprazole (PRILOSEC) 40 mg capsule             Sig: TAKE ONE CAPSULE BY MOUTH ONCE DAILY  FOR  YOUR  STOMACH         Dispense:  90 Cap         Refill:  3         Please consider 90 day supplies to promote better adherence        ?  lovastatin (MEVACOR) 40 mg tablet             Sig: TAKE ONE TABLET BY MOUTH ONCE DAILY AT  NIGHT         Dispense:  90 Tab         Refill:  3         Please consider 90 day supplies to promote better adherence        ?  lisinopril-hydroCHLOROthiazide (PRINZIDE, ZESTORETIC) 20-25 mg per tablet             Sig: One tab by mouth daily         Dispense:  90 Tab         Refill:  3        ?  amLODIPine (NORVASC) 5 mg tablet             Sig: TAKE ONE TABLET BY MOUTH ONCE DAILY FOR BLOOD PRESSURE         Dispense:  90 Tab             Refill:  3             Patient Instructions              Influenza (Flu) Vaccine (Inactivated or Recombinant): What You Need to Know   Why get vaccinated?   Influenza ("flu") is a contagious disease that spreads around the [place] every winter, usually between October and May.   Flu is caused by influenza viruses  and is spread mainly by coughing, sneezing, and close contact.   Anyone can get flu. Flu strikes suddenly and can last several days. Symptoms vary by age, but can include:   ??  Fever/chills.   ??  Sore throat.   ??  Muscle aches.   ??  Fatigue.   ??  Cough.   ??  Headache.   ??  Runny or stuffy nose.   Flu can also lead to pneumonia and blood infections, and cause diarrhea and seizures in children. If you have a medical condition, such as heart or lung disease, flu can make it worse.   Flu is more dangerous for some people. Infants and young children, people 47 years of age and older, pregnant women, and people with certain health conditions or a weakened immune system are at greatest risk.   Each year thousands of people in the [place] die from flu, and many more are hospitalized.   Flu vaccine can:   ??  Keep you from getting flu.   ??  Make flu less severe if you do get it.   ??  Keep you from spreading flu to your family and other people.   Inactivated and recombinant flu vaccines   A dose of flu vaccine is recommended every flu season. Children 6 months through 91 years of age may need two doses during the same flu season. Everyone else needs only one dose each flu season.   Some inactivated flu vaccines contain a very small amount of a mercury-based preservative called thimerosal. Studies have not shown thimerosal in vaccines to be harmful, but flu vaccines that do not contain thimerosal  are available.   There is no live flu virus in flu shots. They cannot cause the flu.   There are many flu viruses, and they are always changing. Each year a new flu vaccine is made to protect against three or four viruses that are likely to cause disease in the upcoming flu season. But even when the vaccine doesn't exactly match these viruses,  it may still provide some protection.   Flu vaccine cannot prevent:   ??  Flu that is caused by a virus not covered by the vaccine.   ??  Illnesses that look like flu but are not.   Some  people should not get this vaccine   Tell the person who is giving you the vaccine:   ??  If you have any severe (life-threatening) allergies. If you ever had a life-threatening allergic reaction after  a dose of flu vaccine, or have a severe allergy to any part of this vaccine, you may be advised not to get vaccinated. Most, but not all, types of flu vaccine contain a small amount of egg protein.   ??  If you ever had Guillain-Barr?? syndrome (also called GBS) Some people with a history of GBS should not get this vaccine. This should be discussed with your doctor.   ??  If you are not feeling well. It is usually okay to get flu vaccine when you have a mild illness, but you might be asked to come back when you feel better.   Risks of a vaccine reaction   With any medicine, including vaccines, there is a chance of reactions. These are usually mild and go away on their own, but serious reactions are also possible.   Most people who get a flu shot do not have any problems with it.   Minor problems following a flu shot include:   ??  Soreness, redness, or swelling where the shot was given   ??  Hoarseness   ??  Sore, red or itchy eyes   ??  Cough   ??  Fever   ??  Aches   ??  Headache   ??  Itching   ??  Fatigue   If these problems occur, they usually begin soon after the shot and last 1 or 2 days.   More serious problems following a flu shot can include the following:   ??  There may be a small increased risk of Guillain-Barr?? Syndrome (GBS) after inactivated flu vaccine. This risk has been estimated at 1 or 2 additional cases per million  people vaccinated. This is much lower than the risk of severe complications from flu, which can be prevented by flu vaccine.   ??  Young children who get the flu shot along with pneumococcal vaccine (PCV13) and/or DTaP vaccine at the same time might be slightly more likely to have a seizure caused by fever. Ask your doctor for more information. Tell your doctor if a child who is  getting flu  vaccine has ever had a seizure   Problems that could happen after any injected vaccine:   ??  People sometimes faint after a medical procedure, including vaccination. Sitting or lying down for about 15 minutes can help prevent fainting, and injuries caused by a fall.  Tell your doctor if you feel dizzy, or have vision changes or ringing in the ears.   ??  Some people get severe pain in the shoulder and have difficulty moving the arm where a shot was given.  This happens very rarely.   ??  Any medication can cause a severe allergic reaction. Such reactions from a vaccine are very rare, estimated at about 1 in a million doses, and would happen within a few minutes to a few hours after the vaccination.   As with any medicine, there is a very remote chance of a vaccine causing a serious injury or death.   The safety of vaccines is always being monitored. For more information, visit: http://www.aguilar.org/.   What if there is a serious reaction?   What should I look for?   ??  Look for anything that concerns you, such as signs of a severe allergic reaction, very high fever, or unusual behavior.   Signs of a severe allergic reaction can include hives, swelling of the face and throat, difficulty breathing, a fast heartbeat, dizziness, and weakness - usually within a few  minutes to a few hours after the vaccination.   What should I do?   ??  If you think it is a severe allergic reaction or other emergency that can't wait, call 9-1-1 and  get the person to the nearest hospital. Otherwise, call your doctor.   ??  Reactions should be reported to the "Vaccine Adverse Event Reporting System" (VAERS). Your doctor should file this report, or you can do it yourself through the VAERS website at www.vaers.SamedayNews.es , or by calling 216-523-0445.   VAERS does not give medical advice.   The National Vaccine Injury Compensation Program   The National Vaccine Injury Compensation Program (VICP) is a federal program that was created to  compensate people who may have been injured by certain vaccines.   Persons who believe they may have been injured by a vaccine can learn about the program and about filing a claim by calling [phone] or visiting the [place] website at  GoldCloset.com.ee. There is a time limit to file a claim for compensation.   How can I learn more?   ??  Ask your healthcare provider. He or she can give you the vaccine package insert or suggest other sources of information.   ??  Call your local or state health department.   ??  Contact the Centers for Disease Control and Prevention (CDC):   ?  Call 458-837-0119 (1-800-CDC-INFO) or   ?  Visit CDC's website at https://gibson.com/   Vaccine Information Statement   Inactivated Influenza Vaccine   04/27/2014)   42 U.S.C. ?? [phone]   Department of Health and Chief Operating Officer for Disease Control and Prevention   Many Vaccine Information Statements are available in Spanish and other languages. See [employer].com.br.   Muchas hojas de informaci??n sobre vacunas est??n disponibles en espa??ol y en otros idiomas. Visite [employer]olutely[employer]ine.com.br.   Care instructions adapted under license by [employer] (which disclaims liability or warranty for this information). If you have questions about a medical condition or this instruction, always ask your healthcare professional. [place] any warranty or liability for your use of this information.            High Cholesterol: Care Instructions   Your Care Instructions        Cholesterol is a type of fat in your blood. It is needed for many body functions, such as making new cells. Cholesterol is made by your body. It also comes from food you eat. High cholesterol means that you have too much of the fat in your blood. This  raises your risk of a  heart attack and stroke.   LDL and HDL are part of your total cholesterol. LDL is the "bad" cholesterol. High LDL can raise your risk for heart disease, heart  attack, and stroke. HDL is the "good" cholesterol. It helps clear bad cholesterol from the body. High HDL is linked with  a lower risk of heart disease, heart attack, and stroke.   Your cholesterol levels help your doctor find out your risk for having a heart attack or stroke. You and your doctor can talk about whether you need to lower your risk and what treatment is best for you.   A heart-healthy lifestyle along with medicines can help lower your cholesterol and your risk. The way you choose to lower your risk will depend on how high your risk is for heart attack and stroke. It will also depend on how you feel about taking medicines.   Follow-up care is a key part of your treatment and safety. Be sure to make and go to all appointments, and call your doctor if you are having problems. It's also a good idea to  know your test results and keep a list of the medicines you take.   How can you care for yourself at home?   ??  Eat a variety of foods every day. Good choices include fruits, vegetables, whole grains (like oatmeal), dried beans and peas, nuts and seeds, soy products (like tofu), and  fat-free or low-fat dairy products.   ??  Replace butter, margarine, and hydrogenated or partially hydrogenated oils with olive and canola oils. (Canola oil margarine without trans fat is fine.)   ??  Replace red meat with fish, poultry, and soy protein (like tofu).   ??  Limit processed and packaged foods like chips, crackers, and cookies.   ??  Bake, broil, or steam foods. Don't fry them.   ??  Be physically active. Get at least 30 minutes of exercise on most days of the week. Walking is a good choice. You also may want to do other activities, such as running, swimming, cycling, or playing tennis or team sports.   ??  Stay at a healthy weight or lose weight by making the changes in eating and physical activity listed above. Losing just a small amount of weight, even 5 to 10 pounds, can reduce your risk for having a heart attack or  stroke.   ??  Do not smoke.   When should you call for help?     Watch closely for changes in your health, and be sure to contact your doctor if:      ??  ??  You need help making lifestyle changes.     ??  ??  You have questions about your medicine.     Where can you learn more?   Go to [email or web address].   Enter [phone] in the search box to learn more about "High Cholesterol: Care Instructions."   Current as of: December 28, 2017   Content Version: 12.2   ?? 2006-2019 Healthwise, Incorporated. Care instructions adapted under license by [employer] (which disclaims liability or warranty for this information). If you have questions about a medical condition or this instruction, always ask your  healthcare professional. [place] any warranty or liability for your use of this information.            High Blood Pressure: Care Instructions   Overview        It's normal for blood pressure to go  up and down throughout the day. But if it stays up, you have high blood pressure. Another name for high blood pressure is hypertension.   Despite what a lot of people think, high blood pressure usually doesn't cause headaches or make you feel dizzy or lightheaded. It usually has no symptoms. But it does increase your risk of stroke, heart attack, and other problems. You and your doctor  will talk about your risks of these problems based on your blood pressure.   Your doctor will give you a goal for your blood pressure. Your goal will be based on your health and your age.   Lifestyle changes, such as eating healthy and being active, are always important to help lower blood pressure. You might also take medicine to reach your blood pressure goal.   Follow-up care is a key part of your treatment and safety. Be sure to make and go to all appointments, and call your doctor if you are having problems. It's also a good idea to  know your test results and keep a list of the medicines you  take.   How can you care for yourself at home?   Medical treatment   ??  If you stop taking your medicine, your blood pressure will go back up. You may take one or more types of medicine to lower your blood pressure. Be safe with medicines. Take  your medicine exactly as prescribed. Call your doctor if you think you are having a problem with your medicine.   ??  Talk to your doctor before you start taking aspirin every day. Aspirin can help certain people lower their risk of a heart attack or stroke. But taking aspirin isn't right for everyone, because it can cause serious bleeding.   ??  See your doctor regularly. You may need to see the doctor more often at first or until your blood pressure comes down.   ??  If you are taking blood pressure medicine, talk to your doctor before you take decongestants or anti-inflammatory medicine, such as ibuprofen. Some of these medicines can raise blood pressure.   ??  Learn how to check your blood pressure at home.   Lifestyle changes   ??  Stay at a healthy weight. This is especially important if you put on weight around the waist. Losing even 10 pounds can help you lower your blood pressure.   ??  If your doctor recommends it, get more exercise. Walking is a good choice. Bit by bit, increase the amount you walk every day. Try for at least 30 minutes on most days of the week. You also may want to swim, bike, or do other activities.   ??  Avoid or limit alcohol. Talk to your doctor about whether you can drink any alcohol.   ??  Try to limit how much sodium you eat to less than 2,300 milligrams (mg) a day. Your doctor may ask you to try to eat less than 1,500 mg a day.   ??  Eat plenty of fruits (such as bananas and oranges), vegetables, legumes, whole grains, and low-fat dairy products.   ??  Lower the amount of saturated fat in your diet. Saturated fat is found in animal products such as milk, cheese, and meat. Limiting these foods may help you lose weight and also lower your risk for  heart disease.   ??  Do not smoke. Smoking increases your risk for heart attack and stroke. If you need help quitting, talk to  your doctor about stop-smoking programs and medicines. These can increase your chances of quitting for good.   When should you call for help?     Call  911 anytime you think you may need emergency care. This may mean having symptoms that suggest that your blood pressure is causing a serious heart or blood vessel problem.  Your blood pressure may be over 180/120.   ??For example, call  911 if:      ??  ??  You have symptoms of a heart attack. These may include:   ?  Chest pain or pressure, or a strange feeling in the chest.   ?  Sweating.   ?  Shortness of breath.   ?  Nausea or vomiting.   ?  Pain, pressure, or a strange feeling in the back, neck, jaw, or upper belly or in one or both shoulders or arms.   ?  Lightheadedness or sudden weakness.   ?  A fast or irregular heartbeat.        ??  ??  You have symptoms of a stroke. These may include:   ?  Sudden numbness, tingling, weakness, or loss of movement in your face, arm, or leg, especially on only one side of your body.   ?  Sudden vision changes.   ?  Sudden trouble speaking.   ?  Sudden confusion or trouble understanding simple statements.   ?  Sudden problems with walking or balance.   ?  A sudden, severe headache that is different from past headaches.     ??  ??  You have severe back or belly pain.     ??Do not wait until your blood pressure comes down on its own. Get help right away.   ??Call your doctor now or seek immediate care if:      ??  ??  Your blood pressure is much higher than normal (such as 180/120 or higher), but you don't have symptoms.     ??  ??  You think high blood pressure is causing symptoms, such as:   ?  Severe headache.   ?  Blurry vision.     ??Watch closely for changes in your health, and be sure to contact your doctor if:      ??  ??  Your blood pressure measures higher than your doctor recommends at least 2 times. That means  the top number is higher or the bottom number is higher, or both.     ??  ??  You think you may be having side effects from your blood pressure medicine.     Where can you learn more?   Go to [email or web address].   Enter [phone] in the search box to learn more about "High Blood Pressure: Care Instructions."   Current as of: December 28, 2017   Content Version: 12.2   ?? 2006-2019 Healthwise, Incorporated. Care instructions adapted under license by [employer] (which disclaims liability or warranty for this information). If you have questions about a medical condition or this instruction, always ask your  healthcare professional. [place] any warranty or liability for your use of this information.            Prostate Cancer: Care Instructions   Your Care Instructions        The prostate gland is a small, walnut-shaped organ that lies just below a man's bladder. It surrounds the urethra, the tube that carries urine from the bladder out of  the body through the penis. Prostate cancer is the abnormal growth of cells in the prostate  gland. Prostate cancer cells can spread within the prostate, to nearby lymph nodes and other tissues, and to other parts of the body.   When the cancer hasn't spread outside the prostate, it is called localized prostate cancer. With localized prostate cancer, your options depend on how likely it is that your cancer will grow. Tests will show if your cancer is likely to grow.   ??  Low-risk cancer isn't likely to grow right away. If your cancer is low-risk, you can choose active surveillance. This means your cancer will be watched closely by your doctor  with regular checkups and tests to see if the cancer grows. This choice allows you to delay having surgery or radiation, often for many years. If the cancer grows very slowly, you may never need treatment.   ??  Medium-risk cancer is more likely to grow. Some men with this type of cancer may be  able to choose active surveillance. Others may need to choose surgery or radiation.   ??  High-risk cancer is most likely to grow. If you have high-risk cancer, you will likely need to choose surgery or radiation.   If your cancer has already spread outside the prostate or to other parts of the body, then you may have other treatments, like chemotherapy or hormone therapy.   If you are over age 82 or have other serious health problems, like heart disease, you may choose not to have treatments to cure your cancer. Instead, you can just have treatments to manage your symptoms. This is called watchful waiting.   Finding out that you have cancer is scary. You may feel many emotions and may need some help coping. Seek out family, friends, and counselors for support. You also can do things at home to make yourself feel better while you go through treatment. Call  the [facility] [phone]) or visit its website at [email or web address] for more information.   Follow-up care is a key part of your treatment and safety. Be sure to make and go to all appointments, and call your doctor if you are having problems. It's also a good idea to  know your test results and keep a list of the medicines you take.   How can you care for yourself at home?   ??  Your doctor will talk to you about your treatment options. You may need to learn more about each of them before you can decide which treatment is best for you.   ??  Take your medicines exactly as prescribed. Call your doctor if you think you are having a problem with your medicine. You will get more details on the specific medicines your doctor prescribes.   ??  Eat healthy food. If you do not feel like eating, try to eat food that has protein and extra calories to keep up your strength and prevent weight loss. Drink liquid meal replacements for extra calories and protein. Try to eat your main meal early.   ??  Take steps to control your stress and workload. Learn relaxation  techniques.   ?  Share your feelings. Stress and tension affect our emotions. By expressing your feelings to others, you may be able to understand and cope with them.   ?  Consider joining a support group. Talking about a problem with your spouse, a good friend, or other people with similar problems is a good way  to reduce tension and stress.   ?  Express yourself through art. Try writing, crafts, dance, or art to relieve stress. Some dance, writing, or art groups may be available just for people who have cancer.   ?  Be kind to your body and mind. Getting enough sleep, eating a healthy diet, and taking time to do things you enjoy can contribute to an overall feeling of balance in your life and can help reduce stress.   ?  Get help if you need it. Discuss your concerns with your doctor or counselor.   ??  Get some physical activity every day, but do not get too tired. Keep doing the hobbies you enjoy as your energy allows.   ??  If you are vomiting or have diarrhea:   ?  Drink plenty of fluids (enough so that your urine is light yellow or clear like water) to prevent dehydration. Choose water and other caffeine-free clear liquids. If you  have kidney, heart, or liver disease and have to limit fluids, talk with your doctor before you increase the amount of fluids you drink.   ?  When you are able to eat, try clear soups, mild foods, and liquids until all symptoms are gone for 12 to 48 hours. Jell-O, dry toast, crackers, and cooked cereal are also good choices.   ??  If you have not already done so, prepare a list of advance directives. Advance directives are instructions to your doctor and family members about what kind of care you  want if you become unable to speak or express yourself.   When should you call for help?     Call 911 anytime you think you may need emergency care. For example, call if:      ??  ??  You passed out (lost consciousness).     ??Call your doctor now or seek immediate medical care if:      ??  ??   You have new or worse pain.     ??  ??  You have new symptoms, such as a cough, belly pain, vomiting, diarrhea, or a rash.     ??  ??  You have symptoms of a urinary tract infection. For example:   ?  You have blood or pus in your urine.   ?  You have pain in your back just below your rib cage. This is called flank pain.   ?  You have a fever, chills, or body aches.   ?  It hurts to urinate.   ?  You have groin or belly pain.     ??Watch closely for changes in your health, and be sure to contact your doctor if:      ??  ??  You have swollen glands in your armpits, groin, or neck.     ??  ??  You have trouble controlling your urine.     ??  ??  You do not get better as expected.     Where can you learn more?   Go to [email or web address].   Enter V141 in the search box to learn more about "Prostate Cancer: Care Instructions."   Current as of: September 08, 2017   Content Version: 12.2   ?? 2006-2019 Healthwise, Incorporated. Care instructions adapted under license by [employer] (which disclaims liability or warranty for this information). If you have questions about a medical condition or this instruction, always ask your  healthcare professional. [place] any  warranty or liability for your use of this information.            Well Visit, Over 38: Care Instructions   Your Care Instructions        Physical exams can help you stay healthy. Your doctor has checked your overall health and may have suggested ways to take good care of yourself. He or she also may have recommended tests. At home, you can help prevent illness with healthy eating, regular  exercise, and other steps.   Follow-up care is a key part of your treatment and safety. Be sure to make and go to all appointments, and call your doctor if you are having problems. It's also a good idea to  know your test results and keep a list of the medicines you take.   How can you care for yourself at home?   ??  Reach and  stay at a healthy weight. This will lower your risk for many problems, such as obesity, diabetes, heart disease, and high blood pressure.   ??  Get at least 30 minutes of exercise on most days of the week. Walking is a good choice. You also may want to do other activities, such as running, swimming, cycling, or playing tennis or team sports.   ??  Do not smoke. Smoking can make health problems worse. If you need help quitting, talk to your doctor about stop-smoking programs and medicines. These can increase your chances of quitting for good.   ??  Protect your skin from too much sun. When you're outdoors from 10 a.m. to 4 p.m., stay in the shade or cover up with clothing and a hat with a wide brim. Wear sunglasses that block UV rays. Even when it's cloudy, put broad-spectrum sunscreen (SPF 30 or  higher) on any exposed skin.   ??  See a dentist one or two times a year for checkups and to have your teeth cleaned.   ??  Wear a seat belt in the car.   Follow your doctor's advice about when to have certain tests. These tests can spot problems early.   For men and women   ??  Cholesterol. Your doctor will tell you how often to have this done based on your overall health and other things  that can increase your risk for heart attack and stroke.   ??  Blood pressure. Have your blood pressure checked during a routine doctor visit. Your doctor will tell you how often to check your blood pressure based on your age, your blood pressure  results, and other factors.   ??  Diabetes. Ask your doctor whether you should have tests for diabetes.   ??  Vision. Experts recommend that you have yearly exams for glaucoma and other age-related eye problems.   ??  Hearing. Tell your doctor if you notice any change in your hearing. You can have tests to find out how well you hear.   ??  Colon cancer tests. Keep having colon cancer tests as your doctor recommends. You can have one of several types of tests.   ??  Heart attack and stroke risk. At least  every 4 to 6 years, you should have your risk for heart attack and stroke assessed. Your doctor uses factors such as your age, blood pressure,  cholesterol, and whether you smoke or have diabetes to show what your risk for a heart attack or stroke is over the next 10 years.   ??  Osteoporosis. Talk  to your doctor about whether you should have a bone density test to find out whether you have thinning bones. Also ask your doctor about whether you should take  calcium and vitamin D supplements.   For women   ??  Pap test and pelvic exam. You may no longer need a Pap test. Talk with your doctor about whether to stop or continue  to have Pap tests.   ??  Breast exam and mammogram. Ask how often you should have a mammogram, which is an X-ray of your breasts. A mammogram can spot breast cancer before it can be felt and when it is  easiest to treat.   ??  Thyroid disease. Talk to your doctor about whether to have your thyroid checked as part of a regular physical exam. Women have an increased chance of a thyroid problem.   For men   ??  Prostate exam. Talk to your doctor about whether you should have a blood test (called a PSA test) for prostate  cancer. Experts recommend that you discuss the benefits and risks of the test with your doctor before you decide whether to have this test. Some experts say that men ages 52 and older no longer need testing.   ??  Abdominal aortic aneurysm. Ask your doctor whether you should have a test to check for an aneurysm. You may need a test if you ever smoked or if your parent, brother, sister, or  child has had an aneurysm.   When should you call for help?     Watch closely for changes in your health, and be sure to contact your doctor if you have any problems or symptoms that concern you.   Where can you learn more?   Go to [email or web address].   Enter [phone] in the search box to learn more about "Well Visit, Over 65: Care Instructions."   Current as of: September 02, 2017   Content Version: 12.2   ?? 2006-2019 Healthwise, Incorporated. Care instructions adapted under license by [employer] (which disclaims liability or warranty for this information). If you have questions about a medical condition or this instruction, always ask your  healthcare professional. [place] any warranty or liability for your use of this information.               Follow-up and Dispositions      ??  Return in about 2 weeks (around 07/19/2018), or if symptoms worsen or fail to improve, for discuss results; HTN/BP.             Influenza vaccine administered.   I have reviewed with the patient details of the assessment and plan and all questions were answered. Relevent patient education was performed      An After Visit Summary was printed and given to the patient.      Tyan Lasure A. Hector [place], DNP

## 2018-07-05 NOTE — Progress Notes (Signed)
 Chief Complaint   Patient presents with   . Establish Care   . Immunization/Injection     1. Have you been to the ER, urgent care clinic since your last visit?  Hospitalized since your last visit?Yes Reason for visit: heel injury    2. Have you seen or consulted any other health care providers outside of the [facility] since your last visit?  Include any pap smears or colon screening. No     Damon [name] is a 67 y.o. male  who presents for routine immunizations.   He denies any symptoms , reactions or allergies that would exclude them from being immunized today.  Risks and adverse reactions were discussed and the VIS was given to them. All questions were addressed.  He was observed for 5 min post injection. There were no reactions observed.    Damon [name]     3 most recent PHQ Screens 07/05/2018   Little interest or pleasure in doing things Not at all   Feeling down, depressed, irritable, or hopeless Not at all   Total Score PHQ 2 0     Abuse Screening Questionnaire 04/18/2015   Do you ever feel afraid of your partner? N   Are you in a relationship with someone who physically or mentally threatens you? N   Is it safe for you to go home? N     Fall Risk Assessment, last 12 mths 07/05/2018   Able to walk? Yes   Fall in past 12 months? Yes   Fall with injury? Yes   Number of falls in past 12 months 1   Fall Risk Score 2

## 2018-07-05 NOTE — ACP (Advance Care Planning) (Signed)
Discussed advanced health care directive with patient and his daughter who report patient has a health care directive and was given to his doctor and should be in his record. This provider searched documents but none noted. Daughter will bring next visit to scan into chart.

## 2018-07-05 NOTE — Patient Instructions (Addendum)
Influenza (Flu) Vaccine (Inactivated or Recombinant): What You Need to Know  Why get vaccinated?  Influenza ("flu") is a contagious disease that spreads around the [place] every winter, usually between October and May.  Flu is caused by influenza viruses and is spread mainly by coughing, sneezing, and close contact.  Anyone can get flu. Flu strikes suddenly and can last several days. Symptoms vary by age, but can include:  ?? Fever/chills.  ?? Sore throat.  ?? Muscle aches.  ?? Fatigue.  ?? Cough.  ?? Headache.  ?? Runny or stuffy nose.  Flu can also lead to pneumonia and blood infections, and cause diarrhea and seizures in children. If you have a medical condition, such as heart or lung disease, flu can make it worse.  Flu is more dangerous for some people. Infants and young children, people 65 years of age and older, pregnant women, and people with certain health conditions or a weakened immune system are at greatest risk.  Each year thousands of people in the [place] die from flu, and many more are hospitalized.  Flu vaccine can:  ?? Keep you from getting flu.  ?? Make flu less severe if you do get it.  ?? Keep you from spreading flu to your family and other people.  Inactivated and recombinant flu vaccines  A dose of flu vaccine is recommended every flu season. Children 6 months through 8 years of age may need two doses during the same flu season. Everyone else needs only one dose each flu season.  Some inactivated flu vaccines contain a very small amount of a mercury-based preservative called thimerosal. Studies have not shown thimerosal in vaccines to be harmful, but flu vaccines that do not contain thimerosal are available.  There is no live flu virus in flu shots. They cannot cause the flu.  There are many flu viruses, and they are always changing. Each year a new flu vaccine is made to protect against three or four viruses that are likely to cause disease in the upcoming flu season. But even when the  vaccine doesn't exactly match these viruses, it may still provide some protection.  Flu vaccine cannot prevent:  ?? Flu that is caused by a virus not covered by the vaccine.  ?? Illnesses that look like flu but are not.  Some people should not get this vaccine  Tell the person who is giving you the vaccine:  ?? If you have any severe (life-threatening) allergies. If you ever had a life-threatening allergic reaction after a dose of flu vaccine, or have a severe allergy to any part of this vaccine, you may be advised not to get vaccinated. Most, but not all, types of flu vaccine contain a small amount of egg protein.  ?? If you ever had Guillain-Barr?? syndrome (also called GBS) Some people with a history of GBS should not get this vaccine. This should be discussed with your doctor.  ?? If you are not feeling well. It is usually okay to get flu vaccine when you have a mild illness, but you might be asked to come back when you feel better.  Risks of a vaccine reaction  With any medicine, including vaccines, there is a chance of reactions. These are usually mild and go away on their own, but serious reactions are also possible.  Most people who get a flu shot do not have any problems with it.  Minor problems following a flu shot include:  ?? Soreness, redness, or swelling   where the shot was given  ?? Hoarseness  ?? Sore, red or itchy eyes  ?? Cough  ?? Fever  ?? Aches  ?? Headache  ?? Itching  ?? Fatigue  If these problems occur, they usually begin soon after the shot and last 1 or 2 days.  More serious problems following a flu shot can include the following:  ?? There may be a small increased risk of Guillain-Barr?? Syndrome (GBS) after inactivated flu vaccine. This risk has been estimated at 1 or 2 additional cases per million people vaccinated. This is much lower than the risk of severe complications from flu, which can be prevented by flu vaccine.  ?? Young children who get the flu shot along with pneumococcal vaccine  (PCV13) and/or DTaP vaccine at the same time might be slightly more likely to have a seizure caused by fever. Ask your doctor for more information. Tell your doctor if a child who is getting flu vaccine has ever had a seizure  Problems that could happen after any injected vaccine:  ?? People sometimes faint after a medical procedure, including vaccination. Sitting or lying down for about 15 minutes can help prevent fainting, and injuries caused by a fall. Tell your doctor if you feel dizzy, or have vision changes or ringing in the ears.  ?? Some people get severe pain in the shoulder and have difficulty moving the arm where a shot was given. This happens very rarely.  ?? Any medication can cause a severe allergic reaction. Such reactions from a vaccine are very rare, estimated at about 1 in a million doses, and would happen within a few minutes to a few hours after the vaccination.  As with any medicine, there is a very remote chance of a vaccine causing a serious injury or death.  The safety of vaccines is always being monitored. For more information, visit: www.cdc.gov/vaccinesafety/.  What if there is a serious reaction?  What should I look for?  ?? Look for anything that concerns you, such as signs of a severe allergic reaction, very high fever, or unusual behavior.  Signs of a severe allergic reaction can include hives, swelling of the face and throat, difficulty breathing, a fast heartbeat, dizziness, and weakness ??? usually within a few minutes to a few hours after the vaccination.  What should I do?  ?? If you think it is a severe allergic reaction or other emergency that can't wait, call 9-1-1 and get the person to the nearest hospital. Otherwise, call your doctor.  ?? Reactions should be reported to the "Vaccine Adverse Event Reporting System" (VAERS). Your doctor should file this report, or you can do it yourself through the VAERS website at www.vaers.hhs.gov, or by calling 1-800-822-7967.   VAERS does not give medical advice.  The National Vaccine Injury Compensation Program  The National Vaccine Injury Compensation Program (VICP) is a federal program that was created to compensate people who may have been injured by certain vaccines.  Persons who believe they may have been injured by a vaccine can learn about the program and about filing a claim by calling 1-800-338-2382 or visiting the VICP website at www.hrsa.gov/vaccinecompensation. There is a time limit to file a claim for compensation.  How can I learn more?  ?? Ask your healthcare provider. He or she can give you the vaccine package insert or suggest other sources of information.  ?? Call your local or state health department.  ?? Contact the Centers for Disease Control and Prevention (CDC):  ?   Call (250) 431-8514 (1-800-CDC-INFO) or  ? Visit CDC's website at https://gibson.com/  Vaccine Information Statement  Inactivated Influenza Vaccine  04/27/2014)  [place] ?? [phone]  Department of Health and [place] for Disease Control and Prevention  Many Vaccine Information Statements are available in Spanish and other languages. See [employer].com.br.  Muchas hojas de informaci??n sobre vacunas est??n disponibles en espa??ol y en otros idiomas. Visite [employer].com.br.  Care instructions adapted under license by [facility] (which disclaims liability or warranty for this information). If you have questions about a medical condition or this instruction, always ask your healthcare professional. [place] any warranty or liability for your use of this information.         High Cholesterol: Care Instructions  Your Care Instructions    Cholesterol is a type of fat in your blood. It is needed for many body functions, such as making new cells. Cholesterol is made by your body. It also comes from food you eat. High cholesterol means that you have too  much of the fat in your blood. This raises your risk of a heart attack and stroke.  LDL and HDL are part of your total cholesterol. LDL is the "bad" cholesterol. High LDL can raise your risk for heart disease, heart attack, and stroke. HDL is the "good" cholesterol. It helps clear bad cholesterol from the body. High HDL is linked with a lower risk of heart disease, heart attack, and stroke.  Your cholesterol levels help your doctor find out your risk for having a heart attack or stroke. You and your doctor can talk about whether you need to lower your risk and what treatment is best for you.  A heart-healthy lifestyle along with medicines can help lower your cholesterol and your risk. The way you choose to lower your risk will depend on how high your risk is for heart attack and stroke. It will also depend on how you feel about taking medicines.  Follow-up care is a key part of your treatment and safety. Be sure to make and go to all appointments, and call your doctor if you are having problems. It's also a good idea to know your test results and keep a list of the medicines you take.  How can you care for yourself at home?  ?? Eat a variety of foods every day. Good choices include fruits, vegetables, whole grains (like oatmeal), dried beans and peas, nuts and seeds, soy products (like tofu), and fat-free or low-fat dairy products.  ?? Replace butter, margarine, and hydrogenated or partially hydrogenated oils with olive and canola oils. (Canola oil margarine without trans fat is fine.)  ?? Replace red meat with fish, poultry, and soy protein (like tofu).  ?? Limit processed and packaged foods like chips, crackers, and cookies.  ?? Bake, broil, or steam foods. Don't fry them.  ?? Be physically active. Get at least 30 minutes of exercise on most days of the week. Walking is a good choice. You also may want to do other activities, such as running, swimming, cycling, or playing tennis or team sports.   ?? Stay at a healthy weight or lose weight by making the changes in eating and physical activity listed above. Losing just a small amount of weight, even 5 to 10 pounds, can reduce your risk for having a heart attack or stroke.  ?? Do not smoke.  When should you call for help?  Watch closely for changes in your health, and be sure  to contact your doctor if:  ?? ?? You need help making lifestyle changes.   ?? ?? You have questions about your medicine.   Where can you learn more?  Go to http://www.healthwise.net/[facility]HelpConnections.  Enter I865 in the search box to learn more about "High Cholesterol: Care Instructions."  Current as of: December 28, 2017  Content Version: 12.2  ?? 2006-2019 Healthwise, Incorporated. Care instructions adapted under license by [facility] (which disclaims liability or warranty for this information). If you have questions about a medical condition or this instruction, always ask your healthcare professional. Healthwise, Incorporated disclaims any warranty or liability for your use of this information.         High Blood Pressure: Care Instructions  Overview    It's normal for blood pressure to go up and down throughout the day. But if it stays up, you have high blood pressure. Another name for high blood pressure is hypertension.  Despite what a lot of people think, high blood pressure usually doesn't cause headaches or make you feel dizzy or lightheaded. It usually has no symptoms. But it does increase your risk of stroke, heart attack, and other problems. You and your doctor will talk about your risks of these problems based on your blood pressure.  Your doctor will give you a goal for your blood pressure. Your goal will be based on your health and your age.  Lifestyle changes, such as eating healthy and being active, are always important to help lower blood pressure. You might also take medicine to reach your blood pressure goal.   Follow-up care is a key part of your treatment and safety. Be sure to make and go to all appointments, and call your doctor if you are having problems. It's also a good idea to know your test results and keep a list of the medicines you take.  How can you care for yourself at home?  Medical treatment  ?? If you stop taking your medicine, your blood pressure will go back up. You may take one or more types of medicine to lower your blood pressure. Be safe with medicines. Take your medicine exactly as prescribed. Call your doctor if you think you are having a problem with your medicine.  ?? Talk to your doctor before you start taking aspirin every day. Aspirin can help certain people lower their risk of a heart attack or stroke. But taking aspirin isn't right for everyone, because it can cause serious bleeding.  ?? See your doctor regularly. You may need to see the doctor more often at first or until your blood pressure comes down.  ?? If you are taking blood pressure medicine, talk to your doctor before you take decongestants or anti-inflammatory medicine, such as ibuprofen. Some of these medicines can raise blood pressure.  ?? Learn how to check your blood pressure at home.  Lifestyle changes  ?? Stay at a healthy weight. This is especially important if you put on weight around the waist. Losing even 10 pounds can help you lower your blood pressure.  ?? If your doctor recommends it, get more exercise. Walking is a good choice. Bit by bit, increase the amount you walk every day. Try for at least 30 minutes on most days of the week. You also may want to swim, bike, or do other activities.  ?? Avoid or limit alcohol. Talk to your doctor about whether you can drink any alcohol.  ?? Try to limit how much sodium you eat to   less than 2,300 milligrams (mg) a day. Your doctor may ask you to try to eat less than 1,500 mg a day.  ?? Eat plenty of fruits (such as bananas and oranges), vegetables, legumes,  whole grains, and low-fat dairy products.  ?? Lower the amount of saturated fat in your diet. Saturated fat is found in animal products such as milk, cheese, and meat. Limiting these foods may help you lose weight and also lower your risk for heart disease.  ?? Do not smoke. Smoking increases your risk for heart attack and stroke. If you need help quitting, talk to your doctor about stop-smoking programs and medicines. These can increase your chances of quitting for good.  When should you call for help?  Call  911 anytime you think you may need emergency care. This may mean having symptoms that suggest that your blood pressure is causing a serious heart or blood vessel problem. Your blood pressure may be over 180/120.  ??For example, call  911 if:  ?? ?? You have symptoms of a heart attack. These may include:  ? Chest pain or pressure, or a strange feeling in the chest.  ? Sweating.  ? Shortness of breath.  ? Nausea or vomiting.  ? Pain, pressure, or a strange feeling in the back, neck, jaw, or upper belly or in one or both shoulders or arms.  ? Lightheadedness or sudden weakness.  ? A fast or irregular heartbeat.   ?? ?? You have symptoms of a stroke. These may include:  ? Sudden numbness, tingling, weakness, or loss of movement in your face, arm, or leg, especially on only one side of your body.  ? Sudden vision changes.  ? Sudden trouble speaking.  ? Sudden confusion or trouble understanding simple statements.  ? Sudden problems with walking or balance.  ? A sudden, severe headache that is different from past headaches.   ?? ?? You have severe back or belly pain.   ??Do not wait until your blood pressure comes down on its own. Get help right away.  ??Call your doctor now or seek immediate care if:  ?? ?? Your blood pressure is much higher than normal (such as 180/120 or higher), but you don't have symptoms.   ?? ?? You think high blood pressure is causing symptoms, such as:  ? Severe headache.  ? Blurry vision.    ??Watch closely for changes in your health, and be sure to contact your doctor if:  ?? ?? Your blood pressure measures higher than your doctor recommends at least 2 times. That means the top number is higher or the bottom number is higher, or both.   ?? ?? You think you may be having side effects from your blood pressure medicine.   Where can you learn more?  Go to [email or web address].  Enter [id number] in the search box to learn more about "High Blood Pressure: Care Instructions."  Current as of: December 28, 2017  Content Version: 12.2  ?? 2006-2019 Healthwise, Inc[employer]. Care instructions adapted under license by [facility] (which disclaims liability or warranty for this information). If you have questions about a medical condition or this instruction, always ask your healthcare professional. [place] any warranty or liability for your use of this information.         Prostate Cancer: Care Instructions  Your Care Instructions    The prostate gland is a small, walnut-shaped organ that lies just below a man's bladder. It surrounds the urethra, the  tube that carries urine from the bladder out of the body through the penis. Prostate cancer is the abnormal growth of cells in the prostate gland. Prostate cancer cells can spread within the prostate, to nearby lymph nodes and other tissues, and to other parts of the body.  When the cancer hasn't spread outside the prostate, it is called localized prostate cancer. With localized prostate cancer, your options depend on how likely it is that your cancer will grow. Tests will show if your cancer is likely to grow.  ?? Low-risk cancer isn't likely to grow right away. If your cancer is low-risk, you can choose active surveillance. This means your cancer will be watched closely by your doctor with regular checkups and tests to see if the cancer grows. This choice allows you to delay having surgery or  radiation, often for many years. If the cancer grows very slowly, you may never need treatment.  ?? Medium-risk cancer is more likely to grow. Some men with this type of cancer may be able to choose active surveillance. Others may need to choose surgery or radiation.  ?? High-risk cancer is most likely to grow. If you have high-risk cancer, you will likely need to choose surgery or radiation.  If your cancer has already spread outside the prostate or to other parts of the body, then you may have other treatments, like chemotherapy or hormone therapy.  If you are over age 29 or have other serious health problems, like heart disease, you may choose not to have treatments to cure your cancer. Instead, you can just have treatments to manage your symptoms. This is called watchful waiting.  Finding out that you have cancer is scary. You may feel many emotions and may need some help coping. Seek out family, friends, and counselors for support. You also can do things at home to make yourself feel better while you go through treatment. Call the [facility] [phone]) or visit its website at [email or web address] for more information.  Follow-up care is a key part of your treatment and safety. Be sure to make and go to all appointments, and call your doctor if you are having problems. It's also a good idea to know your test results and keep a list of the medicines you take.  How can you care for yourself at home?  ?? Your doctor will talk to you about your treatment options. You may need to learn more about each of them before you can decide which treatment is best for you.  ?? Take your medicines exactly as prescribed. Call your doctor if you think you are having a problem with your medicine. You will get more details on the specific medicines your doctor prescribes.  ?? Eat healthy food. If you do not feel like eating, try to eat food that has protein and extra calories to keep up your strength and prevent weight  loss. Drink liquid meal replacements for extra calories and protein. Try to eat your main meal early.  ?? Take steps to control your stress and workload. Learn relaxation techniques.  ? Share your feelings. Stress and tension affect our emotions. By expressing your feelings to others, you may be able to understand and cope with them.  ? Consider joining a support group. Talking about a problem with your spouse, a good friend, or other people with similar problems is a good way to reduce tension and stress.  ? Express yourself through art. Try writing, crafts, dance, or art to relieve  stress. Some dance, writing, or art groups may be available just for people who have cancer.  ? Be kind to your body and mind. Getting enough sleep, eating a healthy diet, and taking time to do things you enjoy can contribute to an overall feeling of balance in your life and can help reduce stress.  ? Get help if you need it. Discuss your concerns with your doctor or counselor.  ?? Get some physical activity every day, but do not get too tired. Keep doing the hobbies you enjoy as your energy allows.  ?? If you are vomiting or have diarrhea:  ? Drink plenty of fluids (enough so that your urine is light yellow or clear like water) to prevent dehydration. Choose water and other caffeine-free clear liquids. If you have kidney, heart, or liver disease and have to limit fluids, talk with your doctor before you increase the amount of fluids you drink.  ? When you are able to eat, try clear soups, mild foods, and liquids until all symptoms are gone for 12 to 48 hours. Jell-O, dry toast, crackers, and cooked cereal are also good choices.  ?? If you have not already done so, prepare a list of advance directives. Advance directives are instructions to your doctor and family members about what kind of care you want if you become unable to speak or express yourself.  When should you call for help?   Call 911 anytime you think you may need emergency care. For example, call if:  ?? ?? You passed out (lost consciousness).   ??Call your doctor now or seek immediate medical care if:  ?? ?? You have new or worse pain.   ?? ?? You have new symptoms, such as a cough, belly pain, vomiting, diarrhea, or a rash.   ?? ?? You have symptoms of a urinary tract infection. For example:  ? You have blood or pus in your urine.  ? You have pain in your back just below your rib cage. This is called flank pain.  ? You have a fever, chills, or body aches.  ? It hurts to urinate.  ? You have groin or belly pain.   ??Watch closely for changes in your health, and be sure to contact your doctor if:  ?? ?? You have swollen glands in your armpits, groin, or neck.   ?? ?? You have trouble controlling your urine.   ?? ?? You do not get better as expected.   Where can you learn more?  Go to [email or web address].  Enter V141 in the search box to learn more about "Prostate Cancer: Care Instructions."  Current as of: September 08, 2017  Content Version: 12.2  ?? 2006-2019 [employer], [employer]. Care instructions adapted under license by [facility] (which disclaims liability or warranty for this information). If you have questions about a medical condition or this instruction, always ask your healthcare professional. [place] any warranty or liability for your use of this information.         Well Visit, Over 68: Care Instructions  Your Care Instructions    Physical exams can help you stay healthy. Your doctor has checked your overall health and may have suggested ways to take good care of yourself. He or she also may have recommended tests. At home, you can help prevent illness with healthy eating, regular exercise, and other steps.  Follow-up care is a key part of your treatment and safety. Be sure to make and go to  all appointments, and call your doctor if you are having  problems. It's also a good idea to know your test results and keep a list of the medicines you take.  How can you care for yourself at home?  ?? Reach and stay at a healthy weight. This will lower your risk for many problems, such as obesity, diabetes, heart disease, and high blood pressure.  ?? Get at least 30 minutes of exercise on most days of the week. Walking is a good choice. You also may want to do other activities, such as running, swimming, cycling, or playing tennis or team sports.  ?? Do not smoke. Smoking can make health problems worse. If you need help quitting, talk to your doctor about stop-smoking programs and medicines. These can increase your chances of quitting for good.  ?? Protect your skin from too much sun. When you're outdoors from 10 a.m. to 4 p.m., stay in the shade or cover up with clothing and a hat with a wide brim. Wear sunglasses that block UV rays. Even when it's cloudy, put broad-spectrum sunscreen (SPF 30 or higher) on any exposed skin.  ?? See a dentist one or two times a year for checkups and to have your teeth cleaned.  ?? Wear a seat belt in the car.  Follow your doctor's advice about when to have certain tests. These tests can spot problems early.  For men and women  ?? Cholesterol. Your doctor will tell you how often to have this done based on your overall health and other things that can increase your risk for heart attack and stroke.  ?? Blood pressure. Have your blood pressure checked during a routine doctor visit. Your doctor will tell you how often to check your blood pressure based on your age, your blood pressure results, and other factors.  ?? Diabetes. Ask your doctor whether you should have tests for diabetes.  ?? Vision. Experts recommend that you have yearly exams for glaucoma and other age-related eye problems.  ?? Hearing. Tell your doctor if you notice any change in your hearing. You can have tests to find out how well you hear.   ?? Colon cancer tests. Keep having colon cancer tests as your doctor recommends. You can have one of several types of tests.  ?? Heart attack and stroke risk. At least every 4 to 6 years, you should have your risk for heart attack and stroke assessed. Your doctor uses factors such as your age, blood pressure, cholesterol, and whether you smoke or have diabetes to show what your risk for a heart attack or stroke is over the next 10 years.  ?? Osteoporosis. Talk to your doctor about whether you should have a bone density test to find out whether you have thinning bones. Also ask your doctor about whether you should take calcium and vitamin D supplements.  For women  ?? Pap test and pelvic exam. You may no longer need a Pap test. Talk with your doctor about whether to stop or continue to have Pap tests.  ?? Breast exam and mammogram. Ask how often you should have a mammogram, which is an X-ray of your breasts. A mammogram can spot breast cancer before it can be felt and when it is easiest to treat.  ?? Thyroid disease. Talk to your doctor about whether to have your thyroid checked as part of a regular physical exam. Women have an increased chance of a thyroid problem.  For men  ?? Prostate exam.  Talk to your doctor about whether you should have a blood test (called a PSA test) for prostate cancer. Experts recommend that you discuss the benefits and risks of the test with your doctor before you decide whether to have this test. Some experts say that men ages 13 and older no longer need testing.  ?? Abdominal aortic aneurysm. Ask your doctor whether you should have a test to check for an aneurysm. You may need a test if you ever smoked or if your parent, brother, sister, or child has had an aneurysm.  When should you call for help?  Watch closely for changes in your health, and be sure to contact your doctor if you have any problems or symptoms that concern you.  Where can you learn more?   Go to [email or web address].  Enter [phone] in the search box to learn more about "Well Visit, Over 65: Care Instructions."  Current as of: September 02, 2017  Content Version: 12.2  ?? 2006-2019 [employer]. Care instructions adapted under license by [employer] (which disclaims liability or warranty for this information). If you have questions about a medical condition or this instruction, always ask your healthcare professional. [place] any warranty or liability for your use of this information.

## 2018-07-05 NOTE — Progress Notes (Signed)
Damon [name] is a 67 y.o. male and presents with Establish Care  .  Subjective:        Damon [name] is a 67 y.o. male and presents for annual Medicare Wellness Visit, and to establish care.  He has a hx of newly diagnosed prostate cancer and is considering options for treatment (urologist is Dr. Janifer Adie). He has a hx of aortic abdominal aneurysm in 2016. He fell off a ladder on 05/03/18 and fractured his right heel.    Hypertension Review:  The patient has essential hypertension. BP is 118/62.  Diet and Lifestyle: generally follows a  low sodium diet, exercises sporadically  Home BP Monitoring: is not measured at home.  Pertinent ROS: taking medications as instructed, no medication side effects noted, no TIA's, no chest pain on exertion, no dyspnea on exertion, no swelling of ankles.   Problem List: Reviewed with patient and discussed risk factors.      GERD:  Stable.    Patient Active Problem List   Diagnosis Code   ??? Hypertension I10   ??? GERD (gastroesophageal reflux disease) K21.9   ??? Hyperlipidemia E78.5   ??? Abdominal aortic aneurysm without rupture ([place]) I71.4   ??? GI bleed K92.2       Current medical providers:  Patient Care Team:  Francisca December, NP as PCP - General (Nurse Practitioner)    PSH: Reviewed with patient  Past Surgical History:   Procedure Laterality Date   ??? HX ANGIOPLASTY      iliac, prior to percutaneous Endovascular Aortic Repair (EVAR)   ??? HX ORTHOPAEDIC  04/2018    heel surgery   ??? VASCULAR SURGERY PROCEDURE UNLIST  06/18/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        SH: Reviewed with patient  Social History     Tobacco Use   ??? Smoking status: Former Smoker     Packs/day: 0.50     Years: 44.00     Pack years: 22.00     Types: Cigarettes     Start date: 12/11/1967   ??? Smokeless tobacco: Former Systems developer     Quit date: 05/22/2015   ??? Tobacco comment: Pt. quit smoking 04/2015   Substance Use Topics   ??? Alcohol use: No     Alcohol/week: 0.0 standard drinks     Comment: quit 05/22/2015   ??? Drug use: No        FH: Reviewed with patient  Family History   Problem Relation Age of Onset   ??? Hypertension Mother    ??? Hypertension Sister    ??? Kidney Disease Sister    ??? Hypertension Brother    ??? Heart Disease Brother    ??? Kidney Disease Sister         on dialysis   ??? Heart Disease Sister    ??? Hypertension Sister    ??? Diabetes Father    ??? Heart Disease Father    ??? Hypertension Father    ??? Diabetes Paternal Uncle         deceased, 12's, heart trouble       Medications/Allergies: Reviewed with patient  Current Outpatient Medications on File Prior to Visit   Medication Sig Dispense Refill   ??? aspirin delayed-release 81 mg tablet Take 1 Tab by mouth daily. 90 Tab 3   ??? [DISCONTINUED] amLODIPine (NORVASC) 5 mg tablet TAKE ONE TABLET BY MOUTH ONCE DAILY FOR BLOOD PRESSURE 90 Tab 3   ??? [DISCONTINUED] lisinopril-hydroCHLOROthiazide (PRINZIDE,  ZESTORETIC) 20-25 mg per tablet TAKE ONE TABLET BY MOUTH ONCE DAILY 90 Tab 3   ??? [DISCONTINUED] lovastatin (MEVACOR) 40 mg tablet TAKE ONE TABLET BY MOUTH ONCE DAILY AT  NIGHT 90 Tab 3   ??? [DISCONTINUED] amLODIPine (NORVASC) 5 mg tablet TAKE ONE TABLET BY MOUTH ONCE DAILY FOR BLOOD PRESSURE 90 Tab 3   ??? [DISCONTINUED] lisinopril-hydroCHLOROthiazide (PRINZIDE, ZESTORETIC) 20-25 mg per tablet TAKE ONE TABLET BY MOUTH ONCE DAILY 90 Tab 3   ??? [DISCONTINUED] lovastatin (MEVACOR) 40 mg tablet TAKE ONE TABLET BY MOUTH ONCE DAILY AT  NIGHT 90 Tab 3   ??? [DISCONTINUED] omeprazole (PRILOSEC) 40 mg capsule TAKE ONE CAPSULE BY MOUTH ONCE DAILY FOR  YOUR  STOMACH 90 Cap 3   ??? [DISCONTINUED] tamsulosin (FLOMAX) 0.4 mg capsule Take 1 Cap by mouth daily. 90 Cap 3     No current facility-administered medications on file prior to visit.       No Known Allergies    Objective:  Visit Vitals  BP 118/62   Pulse 70   Temp 97.4 ??F (36.3 ??C) (Oral)   Resp 16   Ht 5\' 9"  (1.753 m)   Wt 193 lb 14.4 oz (88 kg)   SpO2 98%   BMI 28.63 kg/m??    Body mass index is 28.63 kg/m??.     Assessment of cognitive impairment: Alert and oriented x 3    Depression Screen:   3 most recent PHQ Screens 07/05/2018   Little interest or pleasure in doing things Not at all   Feeling down, depressed, irritable, or hopeless Not at all   Total Score PHQ 2 0     Depression Review:  Patient is seen for screen of depression,denies anhedonia, weight gain, insomnia, hypersomnia, psychomotor agitation, psychomotor retardation, fatigue, feelings of worthlessness/guilt, difficulty concentrating, hopelessness, impaired memory and recurrent thoughts of death Treatment includes no medication   She denies recurrent thoughts of death and suicidal thoughts without plan.    Fall Risk Assessment:    Fall Risk Assessment, last 12 mths 07/05/2018   Able to walk? Yes   Fall in past 12 months? Yes   Fall with injury? Yes   Number of falls in past 12 months 1   Fall Risk Score 2       Functional Ability:   Does the patient exhibit a steady gait?  yes   How long did it take the patient to get up and walk from a sitting position? seconds   Is the patient self reliant?  (ie can do own laundry, meals, household chores)  yes     Does the patient handle his/her own medications?  yes     Does the patient handle his/her own money?   yes     Is the patient???s home safe (ie good lighting, handrails on stairs and bath, etc.)?   yes     Did you notice or did patient express any hearing difficulties?   no     Did you notice or did patient express any vision difficulties?   no     Were distance and reading eye charts used?  no       Advance Care Planning:   Patient was offered the opportunity to discuss advance care planning:  yes     Does patient have an Advance Directive:  no   If no, did you provide information on [facility]?  yes       Plan:      Orders  Placed This Encounter   ??? INFLUENZA VIRUS VACCINE, HIGH DOSE SEASONAL, PRESERVATIVE FREE   ??? METABOLIC PANEL, COMPREHENSIVE   ??? CBC WITH AUTOMATED DIFF    ??? OCCULT BLOOD IMMUNOASSAY,DIAGNOSTIC   ??? REFERRAL TO GASTROENTEROLOGY   ??? AMB POC HEMOGLOBIN A1C   ??? AMB POC LIPID PROFILE   ??? AMB POC URINALYSIS DIP STICK AUTO W/O MICRO   ??? tamsulosin (FLOMAX) 0.4 mg capsule   ??? omeprazole (PRILOSEC) 40 mg capsule   ??? lovastatin (MEVACOR) 40 mg tablet   ??? lisinopril-hydroCHLOROthiazide (PRINZIDE, ZESTORETIC) 20-25 mg per tablet   ??? amLODIPine (NORVASC) 5 mg tablet       Health Maintenance   Topic Date Due   ??? DTaP/Tdap/Td series (1 - Tdap) 12/11/1970   ??? Shingrix Vaccine Age 37> (1 of 2) 12/11/1999   ??? FOBT Q 1 YEAR AGE 79-75  09/03/2017   ??? Influenza Age 33 to Adult  04/21/2018   ??? MEDICARE YEARLY EXAM  07/06/2019   ??? GLAUCOMA SCREENING Q2Y  01/06/2020   ??? Hepatitis C Screening  Completed   ??? AAA Screening 48-68 YO Male Smoking Patients  Completed   ??? Pneumococcal 65+ years  Completed     Review of Systems  Constitutional: negative for fevers, chills, anorexia and weight loss  Eyes:   negative for visual disturbance and irritation  ENT:   negative for tinnitus,sore throat,nasal congestion,ear pains.hoarseness  Respiratory:  negative for cough, hemoptysis, dyspnea,wheezing  CV:   negative for chest pain, palpitations, lower extremity edema  GI:   negative for nausea, vomiting, diarrhea, abdominal pain,melena  Endo:               negative for polyuria,polydipsia,polyphagia,heat intolerance  Genitourinary: negative for frequency, dysuria and hematuria. HX of prostate cancer.  Integument:  negative for rash and pruritus  Hematologic:  negative for easy bruising and gum/nose bleeding  Musculoskel: negative for myalgias, arthralgias, back pain, muscle weakness, joint pain. Brace to right leg due to injury sustained in a fall from a ladder.  Neurological:  negative for headaches, dizziness, vertigo, memory problems and gait   Behavl/Psych: negative for feelings of anxiety, depression, mood changes    Past Medical History:   Diagnosis Date   ??? Aneurysm ([place]) 2016     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   ??? 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 Cancer (HCC)     prostate   ??? GERD (gastroesophageal reflux disease)    ??? HTN (hypertension)    ??? Hyperlipidemia      Past Surgical History:   Procedure Laterality Date   ??? HX ANGIOPLASTY      iliac, prior to percutaneous Endovascular Aortic Repair (EVAR)   ??? HX ORTHOPAEDIC  04/2018    heel surgery   ??? VASCULAR SURGERY PROCEDURE UNLIST  06/18/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     Social History     Socioeconomic History   ??? Marital status: SINGLE     Spouse name: Not on file   ??? Number of children: Not on file   ??? Years of education: Not on file   ??? Highest education level: Not on file   Tobacco Use   ??? Smoking status: Former Smoker     Packs/day: 0.50     Years: 44.00     Pack years: 22.00     Types: Cigarettes     Start date:  12/11/1967   ??? Smokeless tobacco: Former Systems developer     Quit date: 05/22/2015   ??? Tobacco comment: Pt. quit smoking 04/2015   Substance and Sexual Activity   ??? Alcohol use: No     Alcohol/week: 0.0 standard drinks     Comment: quit 05/22/2015   ??? Drug use: No     Family History   Problem Relation Age of Onset   ??? Hypertension Mother    ??? Hypertension Sister    ??? Kidney Disease Sister    ??? Hypertension Brother    ??? Heart Disease Brother    ??? Kidney Disease Sister         on dialysis   ??? Heart Disease Sister    ??? Hypertension Sister    ??? Diabetes Father    ??? Heart Disease Father    ??? Hypertension Father    ??? Diabetes Paternal Uncle         deceased, 78's, heart trouble     Current Outpatient Medications   Medication Sig Dispense Refill   ??? tamsulosin (FLOMAX) 0.4 mg capsule Take 1 Cap by mouth daily. 90 Cap 3   ??? omeprazole (PRILOSEC) 40 mg capsule TAKE ONE CAPSULE BY MOUTH ONCE DAILY FOR  YOUR  STOMACH 90 Cap 3   ??? lovastatin (MEVACOR) 40 mg tablet TAKE ONE TABLET BY MOUTH ONCE DAILY AT  NIGHT 90 Tab 3    ??? lisinopril-hydroCHLOROthiazide (PRINZIDE, ZESTORETIC) 20-25 mg per tablet One tab by mouth daily 90 Tab 3   ??? amLODIPine (NORVASC) 5 mg tablet TAKE ONE TABLET BY MOUTH ONCE DAILY FOR BLOOD PRESSURE 90 Tab 3   ??? aspirin delayed-release 81 mg tablet Take 1 Tab by mouth daily. 90 Tab 3     No Known Allergies    Objective:  Visit Vitals  BP 118/62   Pulse 70   Temp 97.4 ??F (36.3 ??C) (Oral)   Resp 16   Ht 5\' 9"  (1.753 m)   Wt 193 lb 14.4 oz (88 kg)   SpO2 98%   BMI 28.63 kg/m??     Physical Exam:   General appearance - alert, well appearing, and in no distress  Mental status - alert, oriented to person, place, and time  EYE-PERLA, EOMI, corneas normal, no foreign bodies  ENT-ENT exam normal, no neck nodes or sinus tenderness  Nose - normal and patent, no erythema, discharge or polyps  Mouth - mucous membranes moist, pharynx normal without lesions  Neck - supple, no significant adenopathy   Chest - clear to auscultation, no wheezes, rales or rhonchi, symmetric air entry   Heart - normal rate, regular rhythm, normal S1, S2, no murmurs, rubs, clicks or gallops   Abdomen - soft, nontender, nondistended, no masses or organomegaly  Lymph- no adenopathy palpable  Ext-peripheral pulses normal, no pedal edema, no clubbing or cyanosis. Right leg injury resulting insoft cast to the right leg due to fall from a ladder.  Skin-Warm and dry. no hyperpigmentation, vitiligo, or suspicious lesions  Neuro -alert, oriented, normal speech, no focal findings or movement disorder noted  Neck-normal C-spine, no tenderness, full ROM without pain  Feet-no nail deformities or callus formation with good pulses noted      Results for orders placed or performed in visit on 07/05/18   AMB POC HEMOGLOBIN A1C   Result Value Ref Range    Hemoglobin A1c (POC) 5.4 %   AMB POC LIPID PROFILE   Result Value Ref Range  Cholesterol (POC) 176     Triglycerides (POC) 225     HDL Cholesterol (POC) 41     LDL Cholesterol (POC) 90 MG/DL     Non-HDL Goal (POC) 135     TChol/HDL Ratio (POC) 4.3    AMB POC URINALYSIS DIP STICK AUTO W/O MICRO   Result Value Ref Range    Color (UA POC) Amber     Clarity (UA POC) Clear     Glucose (UA POC) Negative Negative    Bilirubin (UA POC) Negative Negative    Ketones (UA POC) Negative Negative    Specific gravity (UA POC) 1.025 1.001 - 1.035    Blood (UA POC) Trace Negative    pH (UA POC) 5.5 4.6 - 8.0    Protein (UA POC) Negative Negative    Urobilinogen (UA POC) 0.2 mg/dL 0.2 - 1    Nitrites (UA POC) Negative Negative    Leukocyte esterase (UA POC) Negative Negative       Assessment/Plan:    ICD-10-CM ICD-9-CM    1. Encounter to establish care with new doctor Z76.89 V65.8 AMB POC HEMOGLOBIN A1C      AMB POC LIPID PROFILE      AMB POC URINALYSIS DIP STICK AUTO W/O MICRO   2. Encounter for immunization Z23 V03.89 INFLUENZA VIRUS VACCINE, HIGH DOSE SEASONAL, PRESERVATIVE FREE      CANCELED: INFLUENZA VIRUS VACCINE, HIGH DOSE SEASONAL, PRESERVATIVE FREE   3. Mixed hyperlipidemia E78.2 272.2 AMB POC LIPID PROFILE   4. Essential hypertension M01 027.2 METABOLIC PANEL, COMPREHENSIVE   5. Prostate CA (HCC) C61 185 CBC WITH AUTOMATED DIFF   6. Physical exam, routine Z00.00 V70.0 AMB POC HEMOGLOBIN A1C      AMB POC LIPID PROFILE      AMB POC URINALYSIS DIP STICK AUTO W/O MICRO      METABOLIC PANEL, COMPREHENSIVE      CBC WITH AUTOMATED DIFF      OCCULT BLOOD IMMUNOASSAY,DIAGNOSTIC      REFERRAL TO GASTROENTEROLOGY   7. Medicare annual wellness visit, initial Z00.00 V70.0    8. Screening for diabetes mellitus Z13.1 V77.1 AMB POC HEMOGLOBIN A1C   9. Hx of abdominal aortic aneurysm Z86.79 V12.59    10. Impaired glucose tolerance (oral)  R73.02 790.22 AMB POC HEMOGLOBIN A1C   11. Benign prostatic hyperplasia with incomplete bladder emptying N40.1 600.01 tamsulosin (FLOMAX) 0.4 mg capsule    R39.14 788.21    12. Gastrointestinal hemorrhage associated with other gastritis K29.61 535.51 omeprazole (PRILOSEC) 40 mg capsule    13. Pure hypercholesterolemia E78.00 272.0 lovastatin (MEVACOR) 40 mg tablet   14. Essential hypertension with goal blood pressure less than 130/80 I10 401.9 lisinopril-hydroCHLOROthiazide (PRINZIDE, ZESTORETIC) 20-25 mg per tablet      amLODIPine (NORVASC) 5 mg tablet   15. Screen for colon cancer Z12.11 V76.51 OCCULT BLOOD IMMUNOASSAY,DIAGNOSTIC      REFERRAL TO GASTROENTEROLOGY     Orders Placed This Encounter   ??? INFLUENZA VIRUS VACCINE, HIGH DOSE SEASONAL, PRESERVATIVE FREE   ??? METABOLIC PANEL, COMPREHENSIVE   ??? CBC WITH AUTOMATED DIFF   ??? OCCULT BLOOD IMMUNOASSAY,DIAGNOSTIC   ??? REFERRAL TO GASTROENTEROLOGY     Referral Priority:   Routine     Referral Type:   Consultation     Referral Reason:   Specialty Services Required     Referred to Provider:   Inez Pilgrim, MD     Requested Specialty:   Gastroenterology   ??? AMB POC HEMOGLOBIN A1C   ???  AMB POC LIPID PROFILE   ??? AMB POC URINALYSIS DIP STICK AUTO W/O MICRO   ??? tamsulosin (FLOMAX) 0.4 mg capsule     Sig: Take 1 Cap by mouth daily.     Dispense:  90 Cap     Refill:  3   ??? omeprazole (PRILOSEC) 40 mg capsule     Sig: TAKE ONE CAPSULE BY MOUTH ONCE DAILY FOR  YOUR  STOMACH     Dispense:  90 Cap     Refill:  3     Please consider 90 day supplies to promote better adherence   ??? lovastatin (MEVACOR) 40 mg tablet     Sig: TAKE ONE TABLET BY MOUTH ONCE DAILY AT  NIGHT     Dispense:  90 Tab     Refill:  3     Please consider 90 day supplies to promote better adherence   ??? lisinopril-hydroCHLOROthiazide (PRINZIDE, ZESTORETIC) 20-25 mg per tablet     Sig: One tab by mouth daily     Dispense:  90 Tab     Refill:  3   ??? amLODIPine (NORVASC) 5 mg tablet     Sig: TAKE ONE TABLET BY MOUTH ONCE DAILY FOR BLOOD PRESSURE     Dispense:  90 Tab     Refill:  3       Patient Instructions          Influenza (Flu) Vaccine (Inactivated or Recombinant): What You Need to Know  Why get vaccinated?  Influenza ("flu") is a contagious disease that spreads around the [place] every winter, usually between October and May.  Flu is caused by influenza viruses and is spread mainly by coughing, sneezing, and close contact.  Anyone can get flu. Flu strikes suddenly and can last several days. Symptoms vary by age, but can include:  ?? Fever/chills.  ?? Sore throat.  ?? Muscle aches.  ?? Fatigue.  ?? Cough.  ?? Headache.  ?? Runny or stuffy nose.  Flu can also lead to pneumonia and blood infections, and cause diarrhea and seizures in children. If you have a medical condition, such as heart or lung disease, flu can make it worse.  Flu is more dangerous for some people. Infants and young children, people 16 years of age and older, pregnant women, and people with certain health conditions or a weakened immune system are at greatest risk.  Each year thousands of people in the [place] die from flu, and many more are hospitalized.  Flu vaccine can:  ?? Keep you from getting flu.  ?? Make flu less severe if you do get it.  ?? Keep you from spreading flu to your family and other people.  Inactivated and recombinant flu vaccines  A dose of flu vaccine is recommended every flu season. Children 6 months through 16 years of age may need two doses during the same flu season. Everyone else needs only one dose each flu season.  Some inactivated flu vaccines contain a very small amount of a mercury-based preservative called thimerosal. Studies have not shown thimerosal in vaccines to be harmful, but flu vaccines that do not contain thimerosal are available.  There is no live flu virus in flu shots. They cannot cause the flu.  There are many flu viruses, and they are always changing. Each year a new flu vaccine is made to protect against three or four viruses that are likely to cause disease in the upcoming flu season. But even when the  vaccine doesn't exactly match these viruses, it may still provide some protection.  Flu vaccine cannot prevent:  ?? Flu that is caused by a virus not covered by the vaccine.   ?? Illnesses that look like flu but are not.  Some people should not get this vaccine  Tell the person who is giving you the vaccine:  ?? If you have any severe (life-threatening) allergies. If you ever had a life-threatening allergic reaction after a dose of flu vaccine, or have a severe allergy to any part of this vaccine, you may be advised not to get vaccinated. Most, but not all, types of flu vaccine contain a small amount of egg protein.  ?? If you ever had Guillain-Barr?? syndrome (also called GBS) Some people with a history of GBS should not get this vaccine. This should be discussed with your doctor.  ?? If you are not feeling well. It is usually okay to get flu vaccine when you have a mild illness, but you might be asked to come back when you feel better.  Risks of a vaccine reaction  With any medicine, including vaccines, there is a chance of reactions. These are usually mild and go away on their own, but serious reactions are also possible.  Most people who get a flu shot do not have any problems with it.  Minor problems following a flu shot include:  ?? Soreness, redness, or swelling where the shot was given  ?? Hoarseness  ?? Sore, red or itchy eyes  ?? Cough  ?? Fever  ?? Aches  ?? Headache  ?? Itching  ?? Fatigue  If these problems occur, they usually begin soon after the shot and last 1 or 2 days.  More serious problems following a flu shot can include the following:  ?? There may be a small increased risk of Guillain-Barr?? Syndrome (GBS) after inactivated flu vaccine. This risk has been estimated at 1 or 2 additional cases per million people vaccinated. This is much lower than the risk of severe complications from flu, which can be prevented by flu vaccine.  ?? Young children who get the flu shot along with pneumococcal vaccine (PCV13) and/or DTaP vaccine at the same time might be slightly more likely to have a seizure caused by fever. Ask your doctor for more information.  Tell your doctor if a child who is getting flu vaccine has ever had a seizure  Problems that could happen after any injected vaccine:  ?? People sometimes faint after a medical procedure, including vaccination. Sitting or lying down for about 15 minutes can help prevent fainting, and injuries caused by a fall. Tell your doctor if you feel dizzy, or have vision changes or ringing in the ears.  ?? Some people get severe pain in the shoulder and have difficulty moving the arm where a shot was given. This happens very rarely.  ?? Any medication can cause a severe allergic reaction. Such reactions from a vaccine are very rare, estimated at about 1 in a million doses, and would happen within a few minutes to a few hours after the vaccination.  As with any medicine, there is a very remote chance of a vaccine causing a serious injury or death.  The safety of vaccines is always being monitored. For more information, visit: http://www.aguilar.org/.  What if there is a serious reaction?  What should I look for?  ?? Look for anything that concerns you, such as signs of a severe allergic reaction, very high fever,  or unusual behavior.  Signs of a severe allergic reaction can include hives, swelling of the face and throat, difficulty breathing, a fast heartbeat, dizziness, and weakness ??? usually within a few minutes to a few hours after the vaccination.  What should I do?  ?? If you think it is a severe allergic reaction or other emergency that can't wait, call 9-1-1 and get the person to the nearest hospital. Otherwise, call your doctor.  ?? Reactions should be reported to the "Vaccine Adverse Event Reporting System" (VAERS). Your doctor should file this report, or you can do it yourself through the VAERS website at www.vaers.SamedayNews.es, or by calling 508-127-6680.  VAERS does not give medical advice.  The National Vaccine Injury Compensation Program  The National Vaccine Injury Compensation Program (VICP) is a federal  program that was created to compensate people who may have been injured by certain vaccines.  Persons who believe they may have been injured by a vaccine can learn about the program and about filing a claim by calling (469)350-9739 or visiting the [place] website at GoldCloset.com.ee. There is a time limit to file a claim for compensation.  How can I learn more?  ?? Ask your healthcare provider. He or she can give you the vaccine package insert or suggest other sources of information.  ?? Call your local or state health department.  ?? Contact the Centers for Disease Control and Prevention (CDC):  ? Call 972-329-8656 (1-800-CDC-INFO) or  ? Visit CDC's website at https://gibson.com/  Vaccine Information Statement  Inactivated Influenza Vaccine  04/27/2014)  42 U.S.C. ?? (901)135-6882  Department of Health and [place] for Disease Control and Prevention  Many Vaccine Information Statements are available in Spanish and other languages. See AbsolutelyGenuine.com.br.  Muchas hojas de informaci??n sobre vacunas est??n disponibles en espa??ol y en otros idiomas. Visite AbsolutelyGenuine.com.br.  Care instructions adapted under license by [employer] (which disclaims liability or warranty for this information). If you have questions about a medical condition or this instruction, always ask your healthcare professional. [place] any warranty or liability for your use of this information.         High Cholesterol: Care Instructions  Your Care Instructions    Cholesterol is a type of fat in your blood. It is needed for many body functions, such as making new cells. Cholesterol is made by your body. It also comes from food you eat. High cholesterol means that you have too much of the fat in your blood. This raises your risk of a heart attack and stroke.  LDL and HDL are part of your total cholesterol. LDL is the "bad"  cholesterol. High LDL can raise your risk for heart disease, heart attack, and stroke. HDL is the "good" cholesterol. It helps clear bad cholesterol from the body. High HDL is linked with a lower risk of heart disease, heart attack, and stroke.  Your cholesterol levels help your doctor find out your risk for having a heart attack or stroke. You and your doctor can talk about whether you need to lower your risk and what treatment is best for you.  A heart-healthy lifestyle along with medicines can help lower your cholesterol and your risk. The way you choose to lower your risk will depend on how high your risk is for heart attack and stroke. It will also depend on how you feel about taking medicines.  Follow-up care is a key part of your treatment and safety. Be sure to make  and go to all appointments, and call your doctor if you are having problems. It's also a good idea to know your test results and keep a list of the medicines you take.  How can you care for yourself at home?  ?? Eat a variety of foods every day. Good choices include fruits, vegetables, whole grains (like oatmeal), dried beans and peas, nuts and seeds, soy products (like tofu), and fat-free or low-fat dairy products.  ?? Replace butter, margarine, and hydrogenated or partially hydrogenated oils with olive and canola oils. (Canola oil margarine without trans fat is fine.)  ?? Replace red meat with fish, poultry, and soy protein (like tofu).  ?? Limit processed and packaged foods like chips, crackers, and cookies.  ?? Bake, broil, or steam foods. Don't fry them.  ?? Be physically active. Get at least 30 minutes of exercise on most days of the week. Walking is a good choice. You also may want to do other activities, such as running, swimming, cycling, or playing tennis or team sports.  ?? Stay at a healthy weight or lose weight by making the changes in eating and physical activity listed above. Losing just a small amount of weight,  even 5 to 10 pounds, can reduce your risk for having a heart attack or stroke.  ?? Do not smoke.  When should you call for help?  Watch closely for changes in your health, and be sure to contact your doctor if:  ?? ?? You need help making lifestyle changes.   ?? ?? You have questions about your medicine.   Where can you learn more?  Go to [email or web address].  Enter [phone] in the search box to learn more about "High Cholesterol: Care Instructions."  Current as of: December 28, 2017  Content Version: 12.2  ?? 2006-2019 [employer]. Care instructions adapted under license by [employer] (which disclaims liability or warranty for this information). If you have questions about a medical condition or this instruction, always ask your healthcare professional. [place] any warranty or liability for your use of this information.         High Blood Pressure: Care Instructions  Overview    It's normal for blood pressure to go up and down throughout the day. But if it stays up, you have high blood pressure. Another name for high blood pressure is hypertension.  Despite what a lot of people think, high blood pressure usually doesn't cause headaches or make you feel dizzy or lightheaded. It usually has no symptoms. But it does increase your risk of stroke, heart attack, and other problems. You and your doctor will talk about your risks of these problems based on your blood pressure.  Your doctor will give you a goal for your blood pressure. Your goal will be based on your health and your age.  Lifestyle changes, such as eating healthy and being active, are always important to help lower blood pressure. You might also take medicine to reach your blood pressure goal.  Follow-up care is a key part of your treatment and safety. Be sure to make and go to all appointments, and call your doctor if you are having  problems. It's also a good idea to know your test results and keep a list of the medicines you take.  How can you care for yourself at home?  Medical treatment  ?? If you stop taking your medicine, your blood pressure will go back up. You may take one or more types  of medicine to lower your blood pressure. Be safe with medicines. Take your medicine exactly as prescribed. Call your doctor if you think you are having a problem with your medicine.  ?? Talk to your doctor before you start taking aspirin every day. Aspirin can help certain people lower their risk of a heart attack or stroke. But taking aspirin isn't right for everyone, because it can cause serious bleeding.  ?? See your doctor regularly. You may need to see the doctor more often at first or until your blood pressure comes down.  ?? If you are taking blood pressure medicine, talk to your doctor before you take decongestants or anti-inflammatory medicine, such as ibuprofen. Some of these medicines can raise blood pressure.  ?? Learn how to check your blood pressure at home.  Lifestyle changes  ?? Stay at a healthy weight. This is especially important if you put on weight around the waist. Losing even 10 pounds can help you lower your blood pressure.  ?? If your doctor recommends it, get more exercise. Walking is a good choice. Bit by bit, increase the amount you walk every day. Try for at least 30 minutes on most days of the week. You also may want to swim, bike, or do other activities.  ?? Avoid or limit alcohol. Talk to your doctor about whether you can drink any alcohol.  ?? Try to limit how much sodium you eat to less than 2,300 milligrams (mg) a day. Your doctor may ask you to try to eat less than 1,500 mg a day.  ?? Eat plenty of fruits (such as bananas and oranges), vegetables, legumes, whole grains, and low-fat dairy products.  ?? Lower the amount of saturated fat in your diet. Saturated fat is found  in animal products such as milk, cheese, and meat. Limiting these foods may help you lose weight and also lower your risk for heart disease.  ?? Do not smoke. Smoking increases your risk for heart attack and stroke. If you need help quitting, talk to your doctor about stop-smoking programs and medicines. These can increase your chances of quitting for good.  When should you call for help?  Call  911 anytime you think you may need emergency care. This may mean having symptoms that suggest that your blood pressure is causing a serious heart or blood vessel problem. Your blood pressure may be over 180/120.  ??For example, call  911 if:  ?? ?? You have symptoms of a heart attack. These may include:  ? Chest pain or pressure, or a strange feeling in the chest.  ? Sweating.  ? Shortness of breath.  ? Nausea or vomiting.  ? Pain, pressure, or a strange feeling in the back, neck, jaw, or upper belly or in one or both shoulders or arms.  ? Lightheadedness or sudden weakness.  ? A fast or irregular heartbeat.   ?? ?? You have symptoms of a stroke. These may include:  ? Sudden numbness, tingling, weakness, or loss of movement in your face, arm, or leg, especially on only one side of your body.  ? Sudden vision changes.  ? Sudden trouble speaking.  ? Sudden confusion or trouble understanding simple statements.  ? Sudden problems with walking or balance.  ? A sudden, severe headache that is different from past headaches.   ?? ?? You have severe back or belly pain.   ??Do not wait until your blood pressure comes down on its own. Get help right away.  ??  Call your doctor now or seek immediate care if:  ?? ?? Your blood pressure is much higher than normal (such as 180/120 or higher), but you don't have symptoms.   ?? ?? You think high blood pressure is causing symptoms, such as:  ? Severe headache.  ? Blurry vision.   ??Watch closely for changes in your health, and be sure to contact your doctor if:   ?? ?? Your blood pressure measures higher than your doctor recommends at least 2 times. That means the top number is higher or the bottom number is higher, or both.   ?? ?? You think you may be having side effects from your blood pressure medicine.   Where can you learn more?  Go to [email or web address].  Enter [phone] in the search box to learn more about "High Blood Pressure: Care Instructions."  Current as of: December 28, 2017  Content Version: 12.2  ?? 2006-2019 [employer]. Care instructions adapted under license by [employer] (which disclaims liability or warranty for this information). If you have questions about a medical condition or this instruction, always ask your healthcare professional. [place] any warranty or liability for your use of this information.         Prostate Cancer: Care Instructions  Your Care Instructions    The prostate gland is a small, walnut-shaped organ that lies just below a man's bladder. It surrounds the urethra, the tube that carries urine from the bladder out of the body through the penis. Prostate cancer is the abnormal growth of cells in the prostate gland. Prostate cancer cells can spread within the prostate, to nearby lymph nodes and other tissues, and to other parts of the body.  When the cancer hasn't spread outside the prostate, it is called localized prostate cancer. With localized prostate cancer, your options depend on how likely it is that your cancer will grow. Tests will show if your cancer is likely to grow.  ?? Low-risk cancer isn't likely to grow right away. If your cancer is low-risk, you can choose active surveillance. This means your cancer will be watched closely by your doctor with regular checkups and tests to see if the cancer grows. This choice allows you to delay having surgery or radiation, often for many years. If the cancer grows very slowly, you may never need treatment.   ?? Medium-risk cancer is more likely to grow. Some men with this type of cancer may be able to choose active surveillance. Others may need to choose surgery or radiation.  ?? High-risk cancer is most likely to grow. If you have high-risk cancer, you will likely need to choose surgery or radiation.  If your cancer has already spread outside the prostate or to other parts of the body, then you may have other treatments, like chemotherapy or hormone therapy.  If you are over age 39 or have other serious health problems, like heart disease, you may choose not to have treatments to cure your cancer. Instead, you can just have treatments to manage your symptoms. This is called watchful waiting.  Finding out that you have cancer is scary. You may feel many emotions and may need some help coping. Seek out family, friends, and counselors for support. You also can do things at home to make yourself feel better while you go through treatment. Call the [place] [phone]) or visit its website at [email or web address] for more information.  Follow-up care is a key part of your treatment and safety.  Be sure to make and go to all appointments, and call your doctor if you are having problems. It's also a good idea to know your test results and keep a list of the medicines you take.  How can you care for yourself at home?  ?? Your doctor will talk to you about your treatment options. You may need to learn more about each of them before you can decide which treatment is best for you.  ?? Take your medicines exactly as prescribed. Call your doctor if you think you are having a problem with your medicine. You will get more details on the specific medicines your doctor prescribes.  ?? Eat healthy food. If you do not feel like eating, try to eat food that has protein and extra calories to keep up your strength and prevent weight loss. Drink liquid meal replacements for extra calories and protein. Try  to eat your main meal early.  ?? Take steps to control your stress and workload. Learn relaxation techniques.  ? Share your feelings. Stress and tension affect our emotions. By expressing your feelings to others, you may be able to understand and cope with them.  ? Consider joining a support group. Talking about a problem with your spouse, a good friend, or other people with similar problems is a good way to reduce tension and stress.  ? Express yourself through art. Try writing, crafts, dance, or art to relieve stress. Some dance, writing, or art groups may be available just for people who have cancer.  ? Be kind to your body and mind. Getting enough sleep, eating a healthy diet, and taking time to do things you enjoy can contribute to an overall feeling of balance in your life and can help reduce stress.  ? Get help if you need it. Discuss your concerns with your doctor or counselor.  ?? Get some physical activity every day, but do not get too tired. Keep doing the hobbies you enjoy as your energy allows.  ?? If you are vomiting or have diarrhea:  ? Drink plenty of fluids (enough so that your urine is light yellow or clear like water) to prevent dehydration. Choose water and other caffeine-free clear liquids. If you have kidney, heart, or liver disease and have to limit fluids, talk with your doctor before you increase the amount of fluids you drink.  ? When you are able to eat, try clear soups, mild foods, and liquids until all symptoms are gone for 12 to 48 hours. Jell-O, dry toast, crackers, and cooked cereal are also good choices.  ?? If you have not already done so, prepare a list of advance directives. Advance directives are instructions to your doctor and family members about what kind of care you want if you become unable to speak or express yourself.  When should you call for help?  Call 911 anytime you think you may need emergency care. For example, call if:  ?? ?? You passed out (lost consciousness).    ??Call your doctor now or seek immediate medical care if:  ?? ?? You have new or worse pain.   ?? ?? You have new symptoms, such as a cough, belly pain, vomiting, diarrhea, or a rash.   ?? ?? You have symptoms of a urinary tract infection. For example:  ? You have blood or pus in your urine.  ? You have pain in your back just below your rib cage. This is called flank pain.  ? You have a  fever, chills, or body aches.  ? It hurts to urinate.  ? You have groin or belly pain.   ??Watch closely for changes in your health, and be sure to contact your doctor if:  ?? ?? You have swollen glands in your armpits, groin, or neck.   ?? ?? You have trouble controlling your urine.   ?? ?? You do not get better as expected.   Where can you learn more?  Go to [email or web address].  Enter V141 in the search box to learn more about "Prostate Cancer: Care Instructions."  Current as of: September 08, 2017  Content Version: 12.2  ?? 2006-2019 [employer], [employer]. Care instructions adapted under license by [employer] (which disclaims liability or warranty for this information). If you have questions about a medical condition or this instruction, always ask your healthcare professional. [place] any warranty or liability for your use of this information.         Well Visit, Over 54: Care Instructions  Your Care Instructions    Physical exams can help you stay healthy. Your doctor has checked your overall health and may have suggested ways to take good care of yourself. He or she also may have recommended tests. At home, you can help prevent illness with healthy eating, regular exercise, and other steps.  Follow-up care is a key part of your treatment and safety. Be sure to make and go to all appointments, and call your doctor if you are having problems. It's also a good idea to know your test results and keep a list of the medicines you take.  How can you care for yourself at home?   ?? Reach and stay at a healthy weight. This will lower your risk for many problems, such as obesity, diabetes, heart disease, and high blood pressure.  ?? Get at least 30 minutes of exercise on most days of the week. Walking is a good choice. You also may want to do other activities, such as running, swimming, cycling, or playing tennis or team sports.  ?? Do not smoke. Smoking can make health problems worse. If you need help quitting, talk to your doctor about stop-smoking programs and medicines. These can increase your chances of quitting for good.  ?? Protect your skin from too much sun. When you're outdoors from 10 a.m. to 4 p.m., stay in the shade or cover up with clothing and a hat with a wide brim. Wear sunglasses that block UV rays. Even when it's cloudy, put broad-spectrum sunscreen (SPF 30 or higher) on any exposed skin.  ?? See a dentist one or two times a year for checkups and to have your teeth cleaned.  ?? Wear a seat belt in the car.  Follow your doctor's advice about when to have certain tests. These tests can spot problems early.  For men and women  ?? Cholesterol. Your doctor will tell you how often to have this done based on your overall health and other things that can increase your risk for heart attack and stroke.  ?? Blood pressure. Have your blood pressure checked during a routine doctor visit. Your doctor will tell you how often to check your blood pressure based on your age, your blood pressure results, and other factors.  ?? Diabetes. Ask your doctor whether you should have tests for diabetes.  ?? Vision. Experts recommend that you have yearly exams for glaucoma and other age-related eye problems.  ?? Hearing. Tell your doctor if you notice any  change in your hearing. You can have tests to find out how well you hear.  ?? Colon cancer tests. Keep having colon cancer tests as your doctor recommends. You can have one of several types of tests.   ?? Heart attack and stroke risk. At least every 4 to 6 years, you should have your risk for heart attack and stroke assessed. Your doctor uses factors such as your age, blood pressure, cholesterol, and whether you smoke or have diabetes to show what your risk for a heart attack or stroke is over the next 10 years.  ?? Osteoporosis. Talk to your doctor about whether you should have a bone density test to find out whether you have thinning bones. Also ask your doctor about whether you should take calcium and vitamin D supplements.  For women  ?? Pap test and pelvic exam. You may no longer need a Pap test. Talk with your doctor about whether to stop or continue to have Pap tests.  ?? Breast exam and mammogram. Ask how often you should have a mammogram, which is an X-ray of your breasts. A mammogram can spot breast cancer before it can be felt and when it is easiest to treat.  ?? Thyroid disease. Talk to your doctor about whether to have your thyroid checked as part of a regular physical exam. Women have an increased chance of a thyroid problem.  For men  ?? Prostate exam. Talk to your doctor about whether you should have a blood test (called a PSA test) for prostate cancer. Experts recommend that you discuss the benefits and risks of the test with your doctor before you decide whether to have this test. Some experts say that men ages 34 and older no longer need testing.  ?? Abdominal aortic aneurysm. Ask your doctor whether you should have a test to check for an aneurysm. You may need a test if you ever smoked or if your parent, brother, sister, or child has had an aneurysm.  When should you call for help?  Watch closely for changes in your health, and be sure to contact your doctor if you have any problems or symptoms that concern you.  Where can you learn more?  Go to [email or web address].  Enter [id number] in the search box to learn more about "Well Visit, Over 65: Care Instructions."   Current as of: September 02, 2017  Content Version: 12.2  ?? 2006-2019 [employer], Inc[employer]. Care instructions adapted under license by [employer] (which disclaims liability or warranty for this information). If you have questions about a medical condition or this instruction, always ask your healthcare professional. [place] any warranty or liability for your use of this information.         Follow-up and Dispositions    ?? Return in about 2 weeks (around 07/19/2018), or if symptoms worsen or fail to improve, for discuss results; HTN/BP.       Influenza vaccine administered.  I have reviewed with the patient details of the assessment and plan and all questions were answered. Relevent patient education was performed    An After Visit Summary was printed and given to the patient.    Oma Alpert A. Hector Brunswick, DNP

## 2018-07-06 LAB — COMPREHENSIVE METABOLIC PANEL
ALT: 23 IU/L (ref 0–44)
AST: 20 IU/L (ref 0–40)
Albumin/Globulin Ratio: 1.4 NA (ref 1.2–2.2)
Albumin: 4.5 g/dL (ref 3.6–4.8)
Alkaline Phosphatase: 110 IU/L (ref 39–117)
BUN: 23 mg/dL (ref 8–27)
Bun/Cre Ratio: 16 NA (ref 10–24)
CO2: 19 mmol/L — ABNORMAL LOW (ref 20–29)
Calcium: 10.1 mg/dL (ref 8.6–10.2)
Chloride: 104 mmol/L (ref 96–106)
Creatinine: 1.4 mg/dL — ABNORMAL HIGH (ref 0.76–1.27)
EGFR IF NonAfrican American: 51 mL/min/{1.73_m2} — ABNORMAL LOW (ref >59)
GFR African American: 59 mL/min/{1.73_m2} — ABNORMAL LOW (ref >59)
Globulin, Total: 3.2 g/dL (ref 1.5–4.5)
Glucose: 79 mg/dL (ref 65–99)
Potassium: 5 mmol/L (ref 3.5–5.2)
Sodium: 144 mmol/L (ref 134–144)
Total Bilirubin: 0.8 mg/dL (ref 0.0–1.2)
Total Protein: 7.7 g/dL (ref 6.0–8.5)

## 2018-07-06 LAB — CBC WITH AUTO DIFFERENTIAL
Basophils %: 0 %
Basophils Absolute: 0 10*3/uL (ref 0.0–0.2)
Eosinophils %: 2 %
Eosinophils Absolute: 0.2 10*3/uL (ref 0.0–0.4)
Granulocyte Absolute Count: 0 10*3/uL (ref 0.0–0.1)
Hematocrit: 45.1 % (ref 37.5–51.0)
Hemoglobin: 14.8 g/dL (ref 13.0–17.7)
Immature Granulocytes: 0 %
Lymphocytes %: 14 %
Lymphocytes Absolute: 1.3 10*3/uL (ref 0.7–3.1)
MCH: 27.4 pg (ref 26.6–33.0)
MCHC: 32.8 g/dL (ref 31.5–35.7)
MCV: 83 fL (ref 79–97)
Monocytes %: 7 %
Monocytes Absolute: 0.6 10*3/uL (ref 0.1–0.9)
Neutrophils %: 77 %
Neutrophils Absolute: 7.1 10*3/uL — ABNORMAL HIGH (ref 1.4–7.0)
Platelets: 305 10*3/uL (ref 150–450)
RBC: 5.41 x10E6/uL (ref 4.14–5.80)
RDW: 14.4 % (ref 12.3–15.4)
WBC: 9.3 10*3/uL (ref 3.4–10.8)

## 2018-07-06 LAB — CKD REPORT

## 2018-07-06 LAB — METABOLIC PANEL, COMPREHENSIVE
A-G Ratio: 1.4 (ref 1.2–2.2)
ALT (SGPT): 23 IU/L (ref 0–44)
AST (SGOT): 20 IU/L (ref 0–40)
Albumin: 4.5 g/dL (ref 3.6–4.8)
Alk. phosphatase: 110 IU/L (ref 39–117)
BUN/Creatinine ratio: 16 (ref 10–24)
BUN: 23 mg/dL (ref 8–27)
Bilirubin, total: 0.8 mg/dL (ref 0.0–1.2)
CO2: 19 mmol/L — ABNORMAL LOW (ref 20–29)
Calcium: 10.1 mg/dL (ref 8.6–10.2)
Chloride: 104 mmol/L (ref 96–106)
Creatinine: 1.4 mg/dL — ABNORMAL HIGH (ref 0.76–1.27)
GFR est AA: 59 mL/min/{1.73_m2} — ABNORMAL LOW (ref >59)
GFR est non-AA: 51 mL/min/{1.73_m2} — ABNORMAL LOW (ref >59)
GLOBULIN, TOTAL: 3.2 g/dL (ref 1.5–4.5)
Glucose: 79 mg/dL (ref 65–99)
Potassium: 5 mmol/L (ref 3.5–5.2)
Protein, total: 7.7 g/dL (ref 6.0–8.5)
Sodium: 144 mmol/L (ref 134–144)

## 2018-07-06 LAB — CBC WITH AUTOMATED DIFF
ABS. BASOPHILS: 0 10*3/uL (ref 0.0–0.2)
ABS. EOSINOPHILS: 0.2 10*3/uL (ref 0.0–0.4)
ABS. IMM. GRANS.: 0 10*3/uL (ref 0.0–0.1)
ABS. MONOCYTES: 0.6 10*3/uL (ref 0.1–0.9)
ABS. NEUTROPHILS: 7.1 10*3/uL — ABNORMAL HIGH (ref 1.4–7.0)
Abs Lymphocytes: 1.3 10*3/uL (ref 0.7–3.1)
BASOPHILS: 0 %
EOSINOPHILS: 2 %
HCT: 45.1 % (ref 37.5–51.0)
HGB: 14.8 g/dL (ref 13.0–17.7)
IMMATURE GRANULOCYTES: 0 %
Lymphocytes: 14 %
MCH: 27.4 pg (ref 26.6–33.0)
MCHC: 32.8 g/dL (ref 31.5–35.7)
MCV: 83 fL (ref 79–97)
MONOCYTES: 7 %
NEUTROPHILS: 77 %
PLATELET: 305 10*3/uL (ref 150–450)
RBC: 5.41 x10E6/uL (ref 4.14–5.80)
RDW: 14.4 % (ref 12.3–15.4)
WBC: 9.3 10*3/uL (ref 3.4–10.8)

## 2018-07-19 ENCOUNTER — Ambulatory Visit
Admit: 2018-07-19 | Discharge: 2018-07-19 | Payer: PRIVATE HEALTH INSURANCE | Attending: Nurse Practitioner | Primary: Internal Medicine

## 2018-07-19 ENCOUNTER — Ambulatory Visit: Attending: Nurse Practitioner | Primary: Internal Medicine

## 2018-07-19 DIAGNOSIS — I10 Essential (primary) hypertension: Secondary | ICD-10-CM

## 2018-07-19 NOTE — Patient Instructions (Addendum)
Gastroesophageal Reflux Disease (GERD): Care Instructions  Your Care Instructions    Gastroesophageal reflux disease (GERD) is the backward flow of stomach acid into the esophagus. The esophagus is the tube that leads from your throat to your stomach. A one-way valve prevents the stomach acid from moving up into this tube. When you have GERD, this valve does not close tightly enough.  If you have mild GERD symptoms including heartburn, you may be able to control the problem with antacids or over-the-counter medicine. Changing your diet, losing weight, and making other lifestyle changes can also help reduce symptoms.  Follow-up care is a key part of your treatment and safety. Be sure to make and go to all appointments, and call your doctor if you are having problems. It's also a good idea to know your test results and keep a list of the medicines you take.  How can you care for yourself at home?  ?? Take your medicines exactly as prescribed. Call your doctor if you think you are having a problem with your medicine.  ?? Your doctor may recommend over-the-counter medicine. For mild or occasional indigestion, antacids, such as Tums, Gaviscon, Mylanta, or Maalox, may help. Your doctor also may recommend over-the-counter acid reducers, such as Pepcid AC, Tagamet HB, Zantac 75, or Prilosec. Read and follow all instructions on the label. If you use these medicines often, talk with your doctor.  ?? Change your eating habits.  ? It's best to eat several small meals instead of two or three large meals.  ? After you eat, wait 2 to 3 hours before you lie down.  ? Chocolate, mint, and alcohol can make GERD worse.  ? Spicy foods, foods that have a lot of acid (like tomatoes and oranges), and coffee can make GERD symptoms worse in some people. If your symptoms are worse after you eat a certain food, you may want to stop eating that food to see if your symptoms get better.   ?? Do not smoke or chew tobacco. Smoking can make GERD worse. If you need help quitting, talk to your doctor about stop-smoking programs and medicines. These can increase your chances of quitting for good.  ?? If you have GERD symptoms at night, raise the head of your bed 6 to 8 inches by putting the frame on blocks or placing a foam wedge under the head of your mattress. (Adding extra pillows does not work.)  ?? Do not wear tight clothing around your middle.  ?? Lose weight if you need to. Losing just 5 to 10 pounds can help.  When should you call for help?  Call your doctor now or seek immediate medical care if:  ?? ?? You have new or different belly pain.   ?? ?? Your stools are black and tarlike or have streaks of blood.   ??Watch closely for changes in your health, and be sure to contact your doctor if:  ?? ?? Your symptoms have not improved after 2 days.   ?? ?? Food seems to catch in your throat or chest.   Where can you learn more?  Go to http://www.healthwise.net/[facility].  Enter T927 in the search box to learn more about "Gastroesophageal Reflux Disease (GERD): Care Instructions."  Current as of: July 28, 2017  Content Version: 12.2  ?? 2006-2019 [employer]. Care instructions adapted under license by [employer] (which disclaims liability or warranty for this information). If you have questions about a medical condition or this instruction, always ask   your healthcare professional. [employer] disclaims any warranty or liability for your use of this information.         High Cholesterol: Care Instructions  Your Care Instructions    Cholesterol is a type of fat in your blood. It is needed for many body functions, such as making new cells. Cholesterol is made by your body. It also comes from food you eat. High cholesterol means that you have too much of the fat in your blood. This raises your risk of a heart attack and stroke.   LDL and HDL are part of your total cholesterol. LDL is the "bad" cholesterol. High LDL can raise your risk for heart disease, heart attack, and stroke. HDL is the "good" cholesterol. It helps clear bad cholesterol from the body. High HDL is linked with a lower risk of heart disease, heart attack, and stroke.  Your cholesterol levels help your doctor find out your risk for having a heart attack or stroke. You and your doctor can talk about whether you need to lower your risk and what treatment is best for you.  A heart-healthy lifestyle along with medicines can help lower your cholesterol and your risk. The way you choose to lower your risk will depend on how high your risk is for heart attack and stroke. It will also depend on how you feel about taking medicines.  Follow-up care is a key part of your treatment and safety. Be sure to make and go to all appointments, and call your doctor if you are having problems. It's also a good idea to know your test results and keep a list of the medicines you take.  How can you care for yourself at home?  ?? Eat a variety of foods every day. Good choices include fruits, vegetables, whole grains (like oatmeal), dried beans and peas, nuts and seeds, soy products (like tofu), and fat-free or low-fat dairy products.  ?? Replace butter, margarine, and hydrogenated or partially hydrogenated oils with olive and canola oils. (Canola oil margarine without trans fat is fine.)  ?? Replace red meat with fish, poultry, and soy protein (like tofu).  ?? Limit processed and packaged foods like chips, crackers, and cookies.  ?? Bake, broil, or steam foods. Don't fry them.  ?? Be physically active. Get at least 30 minutes of exercise on most days of the week. Walking is a good choice. You also may want to do other activities, such as running, swimming, cycling, or playing tennis or team sports.  ?? Stay at a healthy weight or lose weight by making the changes in eating  and physical activity listed above. Losing just a small amount of weight, even 5 to 10 pounds, can reduce your risk for having a heart attack or stroke.  ?? Do not smoke.  When should you call for help?  Watch closely for changes in your health, and be sure to contact your doctor if:  ?? ?? You need help making lifestyle changes.   ?? ?? You have questions about your medicine.   Where can you learn more?  Go to http://www.healthwise.net/[facility].  Enter I865 in the search box to learn more about "High Cholesterol: Care Instructions."  Current as of: December 28, 2017  Content Version: 12.2  ?? 2006-2019 [employer]. Care instructions adapted under license by [facility] (which disclaims liability or warranty for this information). If you have questions about a medical condition or this instruction, always ask your healthcare professional. [employer] disclaims   any warranty or liability for your use of this information.         High Blood Pressure: Care Instructions  Overview    It's normal for blood pressure to go up and down throughout the day. But if it stays up, you have high blood pressure. Another name for high blood pressure is hypertension.  Despite what a lot of people think, high blood pressure usually doesn't cause headaches or make you feel dizzy or lightheaded. It usually has no symptoms. But it does increase your risk of stroke, heart attack, and other problems. You and your doctor will talk about your risks of these problems based on your blood pressure.  Your doctor will give you a goal for your blood pressure. Your goal will be based on your health and your age.  Lifestyle changes, such as eating healthy and being active, are always important to help lower blood pressure. You might also take medicine to reach your blood pressure goal.  Follow-up care is a key part of your treatment and safety. Be sure to make  and go to all appointments, and call your doctor if you are having problems. It's also a good idea to know your test results and keep a list of the medicines you take.  How can you care for yourself at home?  Medical treatment  ?? If you stop taking your medicine, your blood pressure will go back up. You may take one or more types of medicine to lower your blood pressure. Be safe with medicines. Take your medicine exactly as prescribed. Call your doctor if you think you are having a problem with your medicine.  ?? Talk to your doctor before you start taking aspirin every day. Aspirin can help certain people lower their risk of a heart attack or stroke. But taking aspirin isn't right for everyone, because it can cause serious bleeding.  ?? See your doctor regularly. You may need to see the doctor more often at first or until your blood pressure comes down.  ?? If you are taking blood pressure medicine, talk to your doctor before you take decongestants or anti-inflammatory medicine, such as ibuprofen. Some of these medicines can raise blood pressure.  ?? Learn how to check your blood pressure at home.  Lifestyle changes  ?? Stay at a healthy weight. This is especially important if you put on weight around the waist. Losing even 10 pounds can help you lower your blood pressure.  ?? If your doctor recommends it, get more exercise. Walking is a good choice. Bit by bit, increase the amount you walk every day. Try for at least 30 minutes on most days of the week. You also may want to swim, bike, or do other activities.  ?? Avoid or limit alcohol. Talk to your doctor about whether you can drink any alcohol.  ?? Try to limit how much sodium you eat to less than 2,300 milligrams (mg) a day. Your doctor may ask you to try to eat less than 1,500 mg a day.  ?? Eat plenty of fruits (such as bananas and oranges), vegetables, legumes, whole grains, and low-fat dairy products.   ?? Lower the amount of saturated fat in your diet. Saturated fat is found in animal products such as milk, cheese, and meat. Limiting these foods may help you lose weight and also lower your risk for heart disease.  ?? Do not smoke. Smoking increases your risk for heart attack and stroke. If you need help quitting, talk   to your doctor about stop-smoking programs and medicines. These can increase your chances of quitting for good.  When should you call for help?  Call  911 anytime you think you may need emergency care. This may mean having symptoms that suggest that your blood pressure is causing a serious heart or blood vessel problem. Your blood pressure may be over 180/120.  ??For example, call  911 if:  ?? ?? You have symptoms of a heart attack. These may include:  ? Chest pain or pressure, or a strange feeling in the chest.  ? Sweating.  ? Shortness of breath.  ? Nausea or vomiting.  ? Pain, pressure, or a strange feeling in the back, neck, jaw, or upper belly or in one or both shoulders or arms.  ? Lightheadedness or sudden weakness.  ? A fast or irregular heartbeat.   ?? ?? You have symptoms of a stroke. These may include:  ? Sudden numbness, tingling, weakness, or loss of movement in your face, arm, or leg, especially on only one side of your body.  ? Sudden vision changes.  ? Sudden trouble speaking.  ? Sudden confusion or trouble understanding simple statements.  ? Sudden problems with walking or balance.  ? A sudden, severe headache that is different from past headaches.   ?? ?? You have severe back or belly pain.   ??Do not wait until your blood pressure comes down on its own. Get help right away.  ??Call your doctor now or seek immediate care if:  ?? ?? Your blood pressure is much higher than normal (such as 180/120 or higher), but you don't have symptoms.   ?? ?? You think high blood pressure is causing symptoms, such as:  ? Severe headache.  ? Blurry vision.    ??Watch closely for changes in your health, and be sure to contact your doctor if:  ?? ?? Your blood pressure measures higher than your doctor recommends at least 2 times. That means the top number is higher or the bottom number is higher, or both.   ?? ?? You think you may be having side effects from your blood pressure medicine.   Where can you learn more?  Go to http://www.healthwise.net/[facility].  Enter X567 in the search box to learn more about "High Blood Pressure: Care Instructions."  Current as of: December 28, 2017  Content Version: 12.2  ?? 2006-2019 [employer]. Care instructions adapted under license by [facility] (which disclaims liability or warranty for this information). If you have questions about a medical condition or this instruction, always ask your healthcare professional. [employer] disclaims any warranty or liability for your use of this information.         Prostate Cancer: Care Instructions  Your Care Instructions    The prostate gland is a small, walnut-shaped organ that lies just below a man's bladder. It surrounds the urethra, the tube that carries urine from the bladder out of the body through the penis. Prostate cancer is the abnormal growth of cells in the prostate gland. Prostate cancer cells can spread within the prostate, to nearby lymph nodes and other tissues, and to other parts of the body.  When the cancer hasn't spread outside the prostate, it is called localized prostate cancer. With localized prostate cancer, your options depend on how likely it is that your cancer will grow. Tests will show if your cancer is likely to grow.  ?? Low-risk cancer isn't likely to grow right away. If your   cancer is low-risk, you can choose active surveillance. This means your cancer will be watched closely by your doctor with regular checkups and tests to see if the cancer grows. This choice allows you to delay having surgery or  radiation, often for many years. If the cancer grows very slowly, you may never need treatment.  ?? Medium-risk cancer is more likely to grow. Some men with this type of cancer may be able to choose active surveillance. Others may need to choose surgery or radiation.  ?? High-risk cancer is most likely to grow. If you have high-risk cancer, you will likely need to choose surgery or radiation.  If your cancer has already spread outside the prostate or to other parts of the body, then you may have other treatments, like chemotherapy or hormone therapy.  If you are over age 80 or have other serious health problems, like heart disease, you may choose not to have treatments to cure your cancer. Instead, you can just have treatments to manage your symptoms. This is called watchful waiting.  Finding out that you have cancer is scary. You may feel many emotions and may need some help coping. Seek out family, friends, and counselors for support. You also can do things at home to make yourself feel better while you go through treatment. Call the [employer] (1-800-227-2345) or visit its website at www.cancer.org for more information.  Follow-up care is a key part of your treatment and safety. Be sure to make and go to all appointments, and call your doctor if you are having problems. It's also a good idea to know your test results and keep a list of the medicines you take.  How can you care for yourself at home?  ?? Your doctor will talk to you about your treatment options. You may need to learn more about each of them before you can decide which treatment is best for you.  ?? Take your medicines exactly as prescribed. Call your doctor if you think you are having a problem with your medicine. You will get more details on the specific medicines your doctor prescribes.  ?? Eat healthy food. If you do not feel like eating, try to eat food that has protein and extra calories to keep up your strength and prevent weight  loss. Drink liquid meal replacements for extra calories and protein. Try to eat your main meal early.  ?? Take steps to control your stress and workload. Learn relaxation techniques.  ? Share your feelings. Stress and tension affect our emotions. By expressing your feelings to others, you may be able to understand and cope with them.  ? Consider joining a support group. Talking about a problem with your spouse, a good friend, or other people with similar problems is a good way to reduce tension and stress.  ? Express yourself through art. Try writing, crafts, dance, or art to relieve stress. Some dance, writing, or art groups may be available just for people who have cancer.  ? Be kind to your body and mind. Getting enough sleep, eating a healthy diet, and taking time to do things you enjoy can contribute to an overall feeling of balance in your life and can help reduce stress.  ? Get help if you need it. Discuss your concerns with your doctor or counselor.  ?? Get some physical activity every day, but do not get too tired. Keep doing the hobbies you enjoy as your energy allows.  ?? If you are vomiting   or have diarrhea:  ? Drink plenty of fluids (enough so that your urine is light yellow or clear like water) to prevent dehydration. Choose water and other caffeine-free clear liquids. If you have kidney, heart, or liver disease and have to limit fluids, talk with your doctor before you increase the amount of fluids you drink.  ? When you are able to eat, try clear soups, mild foods, and liquids until all symptoms are gone for 12 to 48 hours. Jell-O, dry toast, crackers, and cooked cereal are also good choices.  ?? If you have not already done so, prepare a list of advance directives. Advance directives are instructions to your doctor and family members about what kind of care you want if you become unable to speak or express yourself.  When should you call for help?   Call 911 anytime you think you may need emergency care. For example, call if:  ?? ?? You passed out (lost consciousness).   ??Call your doctor now or seek immediate medical care if:  ?? ?? You have new or worse pain.   ?? ?? You have new symptoms, such as a cough, belly pain, vomiting, diarrhea, or a rash.   ?? ?? You have symptoms of a urinary tract infection. For example:  ? You have blood or pus in your urine.  ? You have pain in your back just below your rib cage. This is called flank pain.  ? You have a fever, chills, or body aches.  ? It hurts to urinate.  ? You have groin or belly pain.   ??Watch closely for changes in your health, and be sure to contact your doctor if:  ?? ?? You have swollen glands in your armpits, groin, or neck.   ?? ?? You have trouble controlling your urine.   ?? ?? You do not get better as expected.   Where can you learn more?  Go to http://www.healthwise.net/[facility].  Enter V141 in the search box to learn more about "Prostate Cancer: Care Instructions."  Current as of: September 08, 2017  Content Version: 12.2  ?? 2006-2019 [employer]. Care instructions adapted under license by [facility] (which disclaims liability or warranty for this information). If you have questions about a medical condition or this instruction, always ask your healthcare professional. [employer] disclaims any warranty or liability for your use of this information.

## 2018-07-19 NOTE — Progress Notes (Signed)
Results; Hypertension; and Blood Pressure Check       Subjective:   HPI   67 year old Damon [name] presents to discuss results and f/u HTN. He has been diagnosed with prostate cancer and reports he has made a decision to receive radiation treatments.    Hypertension Review:  The patient has essential hypertension. BP is 138/64.  Diet and Lifestyle: generally follows a  low sodium diet, exercises sporadically  Home BP Monitoring: is not measured at home.  Pertinent ROS: taking medications as instructed, no medication side effects noted, no TIA's, no chest pain on exertion, no dyspnea on exertion, no swelling of ankles.     Past Medical History:   Diagnosis Date   ??? 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 Cancer (HCC)     prostate   ??? GERD (gastroesophageal reflux disease)    ??? HTN (hypertension)    ??? Hyperlipidemia        Past Surgical History:   Procedure Laterality Date   ??? HX ANGIOPLASTY      iliac, prior to percutaneous Endovascular Aortic Repair (EVAR)   ??? HX ORTHOPAEDIC  04/2018    heel surgery   ??? VASCULAR SURGERY PROCEDURE UNLIST  06/18/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       Prior to Admission medications    Medication Sig Start Date End Date Taking? Authorizing Provider   tamsulosin (FLOMAX) 0.4 mg capsule Take 1 Cap by mouth daily. 07/05/18  Yes Elyssa Pendelton A, NP   omeprazole (PRILOSEC) 40 mg capsule TAKE ONE CAPSULE BY MOUTH ONCE DAILY FOR  YOUR  STOMACH 07/05/18  Yes Zariana Strub A, NP   lovastatin (MEVACOR) 40 mg tablet TAKE ONE TABLET BY MOUTH ONCE DAILY AT  NIGHT 07/05/18  Yes Eyden Dobie A, NP   lisinopril-hydroCHLOROthiazide (PRINZIDE, ZESTORETIC) 20-25 mg per tablet One tab by mouth daily 07/05/18  Yes Kristeen Lantz A, NP   amLODIPine (NORVASC) 5 mg tablet TAKE ONE TABLET BY MOUTH ONCE DAILY FOR BLOOD PRESSURE 07/05/18  Yes Ananias Kolander A, NP    aspirin delayed-release 81 mg tablet Take 1 Tab by mouth daily. 10/19/17  Yes Francisca December, NP        No Known Allergies     Social History     Socioeconomic History   ??? Marital status: SINGLE     Spouse name: Not on file   ??? Number of children: Not on file   ??? Years of education: Not on file   ??? Highest education level: Not on file   Occupational History   ??? Not on file   Social Needs   ??? Financial resource strain: Not on file   ??? Food insecurity:     Worry: Not on file     Inability: Not on file   ??? Transportation needs:     Medical: Not on file     Non-medical: Not on file   Tobacco Use   ??? Smoking status: Former Smoker     Packs/day: 0.50     Years: 44.00     Pack years: 22.00     Types: Cigarettes     Start date: 12/11/1967   ??? Smokeless tobacco: Former Systems developer     Quit date: 05/22/2015   ??? Tobacco comment: Pt. quit smoking 04/2015  Substance and Sexual Activity   ??? Alcohol use: No     Alcohol/week: 0.0 standard drinks     Comment: quit 05/22/2015   ??? Drug use: No   ??? Sexual activity: Not on file   Lifestyle   ??? Physical activity:     Days per week: Not on file     Minutes per session: Not on file   ??? Stress: Not on file   Relationships   ??? Social connections:     Talks on phone: Not on file     Gets together: Not on file     Attends religious service: Not on file     Active member of club or organization: Not on file     Attends meetings of clubs or organizations: Not on file     Relationship status: Not on file   ??? Intimate partner violence:     Fear of current or ex partner: Not on file     Emotionally abused: Not on file     Physically abused: Not on file     Forced sexual activity: Not on file   Other Topics Concern   ??? Not on file   Social History Narrative   ??? Not on file        Family History   Problem Relation Age of Onset   ??? Hypertension Mother    ??? Hypertension Sister    ??? Kidney Disease Sister    ??? Hypertension Brother    ??? Heart Disease Brother    ??? Kidney Disease Sister         on dialysis   ??? Heart  Disease Sister    ??? Hypertension Sister    ??? Diabetes Father    ??? Heart Disease Father    ??? Hypertension Father    ??? Diabetes Paternal Uncle         deceased, 13's, heart trouble          Review of Systems   Constitutional: Negative for chills, fever and malaise/fatigue.   HENT: Negative for congestion, ear discharge, ear pain, nosebleeds, sinus pain, sore throat and tinnitus.    Eyes: Negative for blurred vision, double vision, photophobia, pain, discharge and redness.   Respiratory: Negative for cough, shortness of breath and wheezing.    Cardiovascular: Negative for chest pain and palpitations.   Gastrointestinal: Negative for abdominal pain, constipation, diarrhea, heartburn, nausea and vomiting.   Genitourinary: Negative for dysuria.   Skin: Negative for rash.   Neurological: Negative for dizziness, tingling, tremors, sensory change, speech change, focal weakness, weakness and headaches.   Endo/Heme/Allergies: Negative for polydipsia. Does not bruise/bleed easily.   Psychiatric/Behavioral: Negative for depression.       Objective:     Vitals:    07/19/18 1146   BP: 138/64   Pulse: 64   Resp: 16   Temp: 96.3 ??F (35.7 ??C)   TempSrc: Oral   SpO2: 97%   Weight: 191 lb 12.8 oz (87 kg)   Height: 5\' 9"  (1.753 m)   PainSc:   0 - No pain        Physical Exam   Constitutional: He is oriented to person, place, and time. He appears well-nourished.   HENT:   Head: Normocephalic and atraumatic.   Eyes: Pupils are equal, round, and reactive to light. Conjunctivae are normal.   Neck: Normal range of motion. Neck supple. No thyromegaly present.   Cardiovascular: Regular rhythm and normal heart sounds.  Pulmonary/Chest: Effort normal and breath sounds normal.   Abdominal: Soft. Bowel sounds are normal. He exhibits no distension. There is no tenderness.   Musculoskeletal: Normal range of motion.   Lymphadenopathy:     He has no cervical adenopathy.   Neurological: He is alert and oriented to person, place, and time.   Skin: Skin  is warm and dry.   Psychiatric: He has a normal mood and affect.   Nursing note and vitals reviewed.       Assessment/ Plan:       ICD-10-CM ICD-9-CM    1. Essential hypertension I10 401.9    2. Mixed hyperlipidemia E78.2 272.2    3. Prostate cancer ([place]) C61 185    4. Gastroesophageal reflux disease, esophagitis presence not specified K21.9 530.81    5. Encounter to discuss test results Z71.2 V65.49         No orders of the defined types were placed in this encounter.     Reviewed and discussed lab results with patient. CBC  wnl. CMP demonstrates slightly elevated creatinine level; will monitor.   Patient has decided to have radiation treatments for prostate cancer and will f/u with Dr. Janifer Adie, urologist.    I have reviewed the patient's medical history in detail and updated the computerized patient record.     We had a prolonged discussion about these complex clinical issues and went over the various important aspects to consider. All questions were answered.     Advised him to call back or return to office if symptoms do not improve, change in nature, or persist.  RTO in 3 months to f/u HTN/BP; hyperlipidemia (lipd profile).    He was given an after visit summary or informed of MyChart Access which includes patient instructions, diagnoses, current medications, & vitals.  He expressed understanding with the diagnosis and plan and was given the opportunity to ask questions.    Blanchie Serve, DNP

## 2018-07-19 NOTE — Progress Notes (Signed)
 Chief Complaint   Patient presents with   . Results   . Hypertension   . Blood Pressure Check     1. Have you been to the ER, urgent care clinic since your last visit?  Hospitalized since your last visit?No    2. Have you seen or consulted any other health care providers outside of the [facility] since your last visit?  Include any pap smears or colon screening. No

## 2018-07-19 NOTE — Progress Notes (Signed)
Chief Complaint   Patient presents with   ??? Results   ??? Hypertension   ??? Blood Pressure Check     1. Have you been to the ER, urgent care clinic since your last visit?  Hospitalized since your last visit?No    2. Have you seen or consulted any other health care providers outside of the [place] since your last visit?  Include any pap smears or colon screening. No

## 2018-07-19 NOTE — Progress Notes (Signed)
Results; Hypertension; and Blood Pressure Check       Subjective:   HPI   68 year old Damon [name] presents to discuss results and f/u HTN. He has been diagnosed with prostate cancer and reports he has made a decision to receive radiation treatments.    Hypertension Review:  The patient has essential hypertension. BP is 138/64.  Diet and Lifestyle: generally follows a  low sodium diet, exercises sporadically  Home BP Monitoring: is not measured at home.  Pertinent ROS: taking medications as instructed, no medication side effects noted, no TIA's, no chest pain on exertion, no dyspnea on exertion, no swelling of ankles.     Past Medical History:   Diagnosis Date   ??? 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 Cancer (HCC)     prostate   ??? GERD (gastroesophageal reflux disease)    ??? HTN (hypertension)    ??? Hyperlipidemia        Past Surgical History:   Procedure Laterality Date   ??? HX ANGIOPLASTY      iliac, prior to percutaneous Endovascular Aortic Repair (EVAR)   ??? HX ORTHOPAEDIC  04/2018    heel surgery   ??? VASCULAR SURGERY PROCEDURE UNLIST  06/18/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       Prior to Admission medications    Medication Sig Start Date End Date Taking? Authorizing Provider   tamsulosin (FLOMAX) 0.4 mg capsule Take 1 Cap by mouth daily. 07/05/18  Yes Elodia Haviland A, NP   omeprazole (PRILOSEC) 40 mg capsule TAKE ONE CAPSULE BY MOUTH ONCE DAILY FOR  YOUR  STOMACH 07/05/18  Yes Fredrich Cory A, NP   lovastatin (MEVACOR) 40 mg tablet TAKE ONE TABLET BY MOUTH ONCE DAILY AT  NIGHT 07/05/18  Yes Nykeria Mealing A, NP   lisinopril-hydroCHLOROthiazide (PRINZIDE, ZESTORETIC) 20-25 mg per tablet One tab by mouth daily 07/05/18  Yes Oluwadarasimi Favor A, NP   amLODIPine (NORVASC) 5 mg tablet TAKE ONE TABLET BY MOUTH ONCE DAILY FOR BLOOD PRESSURE 07/05/18  Yes Vola Beneke A, NP    aspirin delayed-release 81 mg tablet Take 1 Tab by mouth daily. 10/19/17  Yes Francisca December, NP        No Known Allergies     Social History     Socioeconomic History   ??? Marital status: SINGLE     Spouse name: Not on file   ??? Number of children: Not on file   ??? Years of education: Not on file   ??? Highest education level: Not on file   Occupational History   ??? Not on file   Social Needs   ??? Financial resource strain: Not on file   ??? Food insecurity:     Worry: Not on file     Inability: Not on file   ??? Transportation needs:     Medical: Not on file     Non-medical: Not on file   Tobacco Use   ??? Smoking status: Former Smoker     Packs/day: 0.50     Years: 44.00     Pack years: 22.00     Types: Cigarettes     Start date: 12/11/1967   ??? Smokeless tobacco: Former Systems developer     Quit date: 05/22/2015   ??? Tobacco comment: Pt. quit smoking 04/2015  Substance and Sexual Activity   ??? Alcohol use: No     Alcohol/week: 0.0 standard drinks     Comment: quit 05/22/2015   ??? Drug use: No   ??? Sexual activity: Not on file   Lifestyle   ??? Physical activity:     Days per week: Not on file     Minutes per session: Not on file   ??? Stress: Not on file   Relationships   ??? Social connections:     Talks on phone: Not on file     Gets together: Not on file     Attends religious service: Not on file     Active member of club or organization: Not on file     Attends meetings of clubs or organizations: Not on file     Relationship status: Not on file   ??? Intimate partner violence:     Fear of current or ex partner: Not on file     Emotionally abused: Not on file     Physically abused: Not on file     Forced sexual activity: Not on file   Other Topics Concern   ??? Not on file   Social History Narrative   ??? Not on file        Family History   Problem Relation Age of Onset   ??? Hypertension Mother    ??? Hypertension Sister    ??? Kidney Disease Sister    ??? Hypertension Brother    ??? Heart Disease Brother    ??? Kidney Disease Sister         on dialysis    ??? Heart Disease Sister    ??? Hypertension Sister    ??? Diabetes Father    ??? Heart Disease Father    ??? Hypertension Father    ??? Diabetes Paternal Uncle         deceased, 62's, heart trouble          Review of Systems   Constitutional: Negative for chills, fever and malaise/fatigue.   HENT: Negative for congestion, ear discharge, ear pain, nosebleeds, sinus pain, sore throat and tinnitus.    Eyes: Negative for blurred vision, double vision, photophobia, pain, discharge and redness.   Respiratory: Negative for cough, shortness of breath and wheezing.    Cardiovascular: Negative for chest pain and palpitations.   Gastrointestinal: Negative for abdominal pain, constipation, diarrhea, heartburn, nausea and vomiting.   Genitourinary: Negative for dysuria.   Skin: Negative for rash.   Neurological: Negative for dizziness, tingling, tremors, sensory change, speech change, focal weakness, weakness and headaches.   Endo/Heme/Allergies: Negative for polydipsia. Does not bruise/bleed easily.   Psychiatric/Behavioral: Negative for depression.       Objective:     Vitals:    07/19/18 1146   BP: 138/64   Pulse: 64   Resp: 16   Temp: 96.3 ??F (35.7 ??C)   TempSrc: Oral   SpO2: 97%   Weight: 191 lb 12.8 oz (87 kg)   Height: 5\' 9"  (1.753 m)   PainSc:   0 - No pain        Physical Exam   Constitutional: He is oriented to person, place, and time. He appears well-nourished.   HENT:   Head: Normocephalic and atraumatic.   Eyes: Pupils are equal, round, and reactive to light. Conjunctivae are normal.   Neck: Normal range of motion. Neck supple. No thyromegaly present.   Cardiovascular: Regular rhythm and normal heart sounds.  Pulmonary/Chest: Effort normal and breath sounds normal.   Abdominal: Soft. Bowel sounds are normal. He exhibits no distension. There is no tenderness.   Musculoskeletal: Normal range of motion.   Lymphadenopathy:     He has no cervical adenopathy.   Neurological: He is alert and oriented to person, place, and time.    Skin: Skin is warm and dry.   Psychiatric: He has a normal mood and affect.   Nursing note and vitals reviewed.       Assessment/ Plan:       ICD-10-CM ICD-9-CM    1. Essential hypertension I10 401.9    2. Mixed hyperlipidemia E78.2 272.2    3. Prostate cancer ([place]) C61 185    4. Gastroesophageal reflux disease, esophagitis presence not specified K21.9 530.81    5. Encounter to discuss test results Z71.2 V65.49         No orders of the defined types were placed in this encounter.     Reviewed and discussed lab results with patient. CBC  wnl. CMP demonstrates slightly elevated creatinine level; will monitor.   Patient has decided to have radiation treatments for prostate cancer and will f/u with Dr. Janifer Adie, urologist.    I have reviewed the patient's medical history in detail and updated the computerized patient record.     We had a prolonged discussion about these complex clinical issues and went over the various important aspects to consider. All questions were answered.     Advised him to call back or return to office if symptoms do not improve, change in nature, or persist.  RTO in 3 months to f/u HTN/BP; hyperlipidemia (lipd profile).    He was given an after visit summary or informed of MyChart Access which includes patient instructions, diagnoses, current medications, & vitals.  He expressed understanding with the diagnosis and plan and was given the opportunity to ask questions.    Damon Serve, DNP

## 2018-08-03 ENCOUNTER — Inpatient Hospital Stay: Admit: 2018-08-03 | Primary: Internal Medicine

## 2018-08-05 LAB — OCCULT BLOOD DIAGNOSTIC: OCCULT BLOOD, FECAL, IA: NEGATIVE

## 2018-08-05 LAB — OCCULT BLOOD IMMUNOASSAY,DIAGNOSTIC: Occult blood fecal, by IA: NEGATIVE

## 2018-10-19 ENCOUNTER — Ambulatory Visit: Admit: 2018-10-19 | Discharge: 2018-10-19 | Payer: MEDICARE | Attending: Nurse Practitioner | Primary: Internal Medicine

## 2018-10-19 ENCOUNTER — Encounter: Attending: Nurse Practitioner | Primary: Internal Medicine

## 2018-10-19 ENCOUNTER — Ambulatory Visit: Attending: Nurse Practitioner | Primary: Internal Medicine

## 2018-10-19 DIAGNOSIS — C61 Malignant neoplasm of prostate: Secondary | ICD-10-CM

## 2018-10-19 NOTE — Progress Notes (Signed)
Continue [place].

## 2018-10-19 NOTE — Progress Notes (Signed)
Hypertension (follow up)       Subjective:   HPI   68 year old Damon [name] presents for f/u HTN/BP; hyperlipidemia, GERD and prostate cancer. Hx of PVD of the right foot and heel with occasional pain and tenderness. Stable today.  He is scheduled to have radiation in February for prostate cancer, otherwise he reports he is doing well.    Hypertension Review:  The patient has essential hypertension. BP is 126/68.  Diet and Lifestyle: generally follows a  low sodium diet, exercises sporadically  Home BP Monitoring: is not measured at home.  Pertinent ROS: taking medications as instructed, no medication side effects noted, no TIA's, no chest pain on exertion, no dyspnea on exertion, no swelling of ankles.     Dyslipidemia Review:  Patient presents for evaluation of lipids.  A repeat fasting lipid profile was done.  The patient does not use medications that may worsen dyslipidemias (corticosteroids, progestins, anabolic steroids, diuretics, beta-blockers, amiodarone, cyclosporine, olanzapine). The patient exercises some    GERD: stable.    Past Medical History:   Diagnosis Date   ??? Aneurysm ([place]) 2016    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   ??? 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 Cancer (HCC)     prostate   ??? GERD (gastroesophageal reflux disease)    ??? HTN (hypertension)    ??? Hyperlipidemia        Past Surgical History:   Procedure Laterality Date   ??? HX ANGIOPLASTY      iliac, prior to percutaneous Endovascular Aortic Repair (EVAR)   ??? HX ORTHOPAEDIC  04/2018    heel surgery   ??? VASCULAR SURGERY PROCEDURE UNLIST  06/18/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       Prior to Admission medications    Medication Sig Start Date End Date Taking? Authorizing Provider   tamsulosin (FLOMAX) 0.4 mg capsule Take 1 Cap by mouth daily. 07/05/18  Yes Ginamarie Banfield A, NP   omeprazole (PRILOSEC) 40 mg capsule TAKE ONE CAPSULE BY MOUTH ONCE DAILY FOR  YOUR  STOMACH  07/05/18  Yes Nathifa Ritthaler A, NP   lovastatin (MEVACOR) 40 mg tablet TAKE ONE TABLET BY MOUTH ONCE DAILY AT  NIGHT 07/05/18  Yes Serenity Fortner A, NP   lisinopril-hydroCHLOROthiazide (PRINZIDE, ZESTORETIC) 20-25 mg per tablet One tab by mouth daily 07/05/18  Yes Bryor Rami A, NP   amLODIPine (NORVASC) 5 mg tablet TAKE ONE TABLET BY MOUTH ONCE DAILY FOR BLOOD PRESSURE 07/05/18  Yes Gautam Langhorst A, NP   aspirin delayed-release 81 mg tablet Take 1 Tab by mouth daily. 10/19/17  Yes Francisca December, NP        No Known Allergies     Social History     Socioeconomic History   ??? Marital status: SINGLE     Spouse name: Not on file   ??? Number of children: Not on file   ??? Years of education: Not on file   ??? Highest education level: Not on file   Occupational History   ??? Not on file   Social Needs   ??? Financial resource strain: Not on file   ??? Food insecurity:     Worry: Not on file     Inability: Not on file   ??? Transportation needs:     Medical: Not on file     Non-medical: Not on file   Tobacco  Use   ??? Smoking status: Former Smoker     Packs/day: 0.50     Years: 44.00     Pack years: 22.00     Types: Cigarettes     Start date: 12/11/1967   ??? Smokeless tobacco: Former Systems developer     Quit date: 05/22/2015   ??? Tobacco comment: Pt. quit smoking 04/2015   Substance and Sexual Activity   ??? Alcohol use: No     Alcohol/week: 0.0 standard drinks     Comment: quit 05/22/2015   ??? Drug use: No   ??? Sexual activity: Not on file   Lifestyle   ??? Physical activity:     Days per week: Not on file     Minutes per session: Not on file   ??? Stress: Not on file   Relationships   ??? Social connections:     Talks on phone: Not on file     Gets together: Not on file     Attends religious service: Not on file     Active member of club or organization: Not on file     Attends meetings of clubs or organizations: Not on file     Relationship status: Not on file   ??? Intimate partner violence:     Fear of current or ex partner: Not on file     Emotionally  abused: Not on file     Physically abused: Not on file     Forced sexual activity: Not on file   Other Topics Concern   ??? Not on file   Social History Narrative   ??? Not on file        Family History   Problem Relation Age of Onset   ??? Hypertension Mother    ??? Hypertension Sister    ??? Kidney Disease Sister    ??? Hypertension Brother    ??? Heart Disease Brother    ??? Kidney Disease Sister         on dialysis   ??? Heart Disease Sister    ??? Hypertension Sister    ??? Diabetes Father    ??? Heart Disease Father    ??? Hypertension Father    ??? Diabetes Paternal Uncle         deceased, 70's, heart trouble          Review of Systems   Constitutional: Negative for chills, fever and malaise/fatigue.   HENT: Negative for congestion, ear discharge, ear pain, sinus pain and sore throat.    Eyes: Negative for blurred vision, pain, discharge and redness.   Respiratory: Negative for cough, shortness of breath and wheezing.    Cardiovascular: Negative for chest pain and palpitations.   Gastrointestinal: Negative for abdominal pain, constipation, diarrhea, nausea and vomiting.   Genitourinary: Negative for dysuria.   Musculoskeletal: Negative for myalgias.   Skin: Negative for itching and rash.   Neurological: Negative for dizziness, tingling, seizures and headaches.   Endo/Heme/Allergies: Negative for polydipsia. Does not bruise/bleed easily.   Psychiatric/Behavioral: Negative for depression.       Objective:     Vitals:    10/19/18 0902   BP: 126/68   Pulse: (!) 59   Resp: 18   Temp: 98.1 ??F (36.7 ??C)   TempSrc: Oral   SpO2: 97%   Weight: 198 lb 3.2 oz (89.9 kg)   Height: 5\' 9"  (1.753 m)   PainSc:   0 - No pain        Physical  Exam  Vitals signs and nursing note reviewed.   Constitutional:       General: He is not in acute distress.     Appearance: Normal appearance. He is not ill-appearing.   HENT:      Head: Normocephalic and atraumatic.      Right Ear: External ear normal.      Left Ear: External ear normal.      Nose: Nose normal. No  congestion or rhinorrhea.      Mouth/Throat:      Mouth: Mucous membranes are moist.      Pharynx: Oropharynx is clear. No oropharyngeal exudate or posterior oropharyngeal erythema.   Eyes:      General:         Right eye: No discharge.         Left eye: No discharge.      Conjunctiva/sclera: Conjunctivae normal.      Pupils: Pupils are equal, round, and reactive to light.   Neck:      Musculoskeletal: Normal range of motion and neck supple.      Vascular: No carotid bruit.   Cardiovascular:      Rate and Rhythm: Regular rhythm.      Pulses: Normal pulses.      Heart sounds: Normal heart sounds.   Pulmonary:      Effort: Pulmonary effort is normal.      Breath sounds: Normal breath sounds.   Abdominal:      General: There is no distension.      Palpations: Abdomen is soft.      Tenderness: There is no abdominal tenderness. There is no right CVA tenderness or left CVA tenderness.   Musculoskeletal: Normal range of motion.      Comments: Occasional pain and tenderness of the right heel due to past injury and hx of PVD.   Lymphadenopathy:      Cervical: No cervical adenopathy.   Skin:     General: Skin is warm and dry.   Neurological:      Mental Status: He is alert and oriented to person, place, and time.   Psychiatric:         Mood and Affect: Mood normal.          Assessment/ Plan:       ICD-10-CM ICD-9-CM    1. Prostate cancer ([place]) C61 185    2. Essential hypertension I10 401.9    3. Gastroesophageal reflux disease, esophagitis presence not specified K21.9 530.81    4. Mixed hyperlipidemia E78.2 272.2 LIPID PANEL   5. PVD (peripheral vascular disease) (HCC) I73.9 443.9    6. Hx of abdominal aortic aneurysm Z86.79 V12.59         Orders Placed This Encounter   ??? LIPID PANEL        I have reviewed the patient's medical history in detail and updated the computerized patient record.     Continue f/u with urology and radiology for Prostate cancer treatment.  Lipid panel drawn today.  Will f/u with vascular and  colonoscopy after prostate cancer treatment.    We had a prolonged discussion about these complex clinical issues and went over the various important aspects to consider. All questions were answered.     Advised him to call back or return to office if symptoms do not improve, change in nature, or persist, otherwise RTO in 3 months to f/u HTN/BP; prostate cancer; hyperlipidemia.    He was given an after visit summary or  informed of MyChart Access which includes patient instructions, diagnoses, current medications, & vitals.  He expressed understanding with the diagnosis and plan and was given the opportunity to ask questions.    Blanchie Serve, DNP

## 2018-10-19 NOTE — Progress Notes (Signed)
 Chief Complaint   Patient presents with   . Hypertension     follow up           1. Have you been to the ER, urgent care clinic since your last visit?  Hospitalized since your last visit?No    2. Have you seen or consulted any other health care providers outside of the [facility] since your last visit?  Include any pap smears or colon screening. Yes Where: Radiologist Reason for visit: prostate cancer

## 2018-10-19 NOTE — Progress Notes (Signed)
Hypertension (follow up)       Subjective:   HPI   68 year old [name] presents for f/u HTN/BP; hyperlipidemia, GERD and prostate cancer. Hx of PVD of the right foot and heel with occasional pain and tenderness. Stable today.  He is scheduled to have radiation in February for prostate cancer, otherwise he reports he is doing well.    Hypertension Review:  The patient has essential hypertension. BP is 126/68.  Diet and Lifestyle: generally follows a  low sodium diet, exercises sporadically  Home BP Monitoring: is not measured at home.  Pertinent ROS: taking medications as instructed, no medication side effects noted, no TIA's, no chest pain on exertion, no dyspnea on exertion, no swelling of ankles.     Dyslipidemia Review:  Patient presents for evaluation of lipids.  A repeat fasting lipid profile was done.  The patient does not use medications that may worsen dyslipidemias (corticosteroids, progestins, anabolic steroids, diuretics, beta-blockers, amiodarone, cyclosporine, olanzapine). The patient exercises some    GERD: stable.    Past Medical History:   Diagnosis Date   ??? Aneurysm ([place]) 2016    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   ??? 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 Cancer (HCC)     prostate   ??? GERD (gastroesophageal reflux disease)    ??? HTN (hypertension)    ??? Hyperlipidemia        Past Surgical History:   Procedure Laterality Date   ??? HX ANGIOPLASTY      iliac, prior to percutaneous Endovascular Aortic Repair (EVAR)   ??? HX ORTHOPAEDIC  04/2018    heel surgery   ??? VASCULAR SURGERY PROCEDURE UNLIST  06/18/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       Prior to Admission medications    Medication Sig Start Date End Date Taking? Authorizing Provider   tamsulosin (FLOMAX) 0.4 mg capsule Take 1 Cap by mouth daily. 07/05/18  Yes Savalas Monje A, NP   omeprazole (PRILOSEC) 40 mg capsule TAKE ONE CAPSULE BY MOUTH ONCE DAILY  FOR  YOUR  STOMACH 07/05/18  Yes Lexa Coronado A, NP   lovastatin (MEVACOR) 40 mg tablet TAKE ONE TABLET BY MOUTH ONCE DAILY AT  NIGHT 07/05/18  Yes Ladye Macnaughton A, NP   lisinopril-hydroCHLOROthiazide (PRINZIDE, ZESTORETIC) 20-25 mg per tablet One tab by mouth daily 07/05/18  Yes Analaura Messler A, NP   amLODIPine (NORVASC) 5 mg tablet TAKE ONE TABLET BY MOUTH ONCE DAILY FOR BLOOD PRESSURE 07/05/18  Yes Verdean Murin A, NP   aspirin delayed-release 81 mg tablet Take 1 Tab by mouth daily. 10/19/17  Yes Francisca December, NP        No Known Allergies     Social History     Socioeconomic History   ??? Marital status: SINGLE     Spouse name: Not on file   ??? Number of children: Not on file   ??? Years of education: Not on file   ??? Highest education level: Not on file   Occupational History   ??? Not on file   Social Needs   ??? Financial resource strain: Not on file   ??? Food insecurity:     Worry: Not on file     Inability: Not on file   ??? Transportation needs:     Medical: Not on file     Non-medical: Not on file   Tobacco  Use   ??? Smoking status: Former Smoker     Packs/day: 0.50     Years: 44.00     Pack years: 22.00     Types: Cigarettes     Start date: 12/11/1967   ??? Smokeless tobacco: Former Systems developer     Quit date: 05/22/2015   ??? Tobacco comment: Pt. quit smoking 04/2015   Substance and Sexual Activity   ??? Alcohol use: No     Alcohol/week: 0.0 standard drinks     Comment: quit 05/22/2015   ??? Drug use: No   ??? Sexual activity: Not on file   Lifestyle   ??? Physical activity:     Days per week: Not on file     Minutes per session: Not on file   ??? Stress: Not on file   Relationships   ??? Social connections:     Talks on phone: Not on file     Gets together: Not on file     Attends religious service: Not on file     Active member of club or organization: Not on file     Attends meetings of clubs or organizations: Not on file     Relationship status: Not on file   ??? Intimate partner violence:      Fear of current or ex partner: Not on file     Emotionally abused: Not on file     Physically abused: Not on file     Forced sexual activity: Not on file   Other Topics Concern   ??? Not on file   Social History Narrative   ??? Not on file        Family History   Problem Relation Age of Onset   ??? Hypertension Mother    ??? Hypertension Sister    ??? Kidney Disease Sister    ??? Hypertension Brother    ??? Heart Disease Brother    ??? Kidney Disease Sister         on dialysis   ??? Heart Disease Sister    ??? Hypertension Sister    ??? Diabetes Father    ??? Heart Disease Father    ??? Hypertension Father    ??? Diabetes Paternal Uncle         deceased, 21's, heart trouble          Review of Systems   Constitutional: Negative for chills, fever and malaise/fatigue.   HENT: Negative for congestion, ear discharge, ear pain, sinus pain and sore throat.    Eyes: Negative for blurred vision, pain, discharge and redness.   Respiratory: Negative for cough, shortness of breath and wheezing.    Cardiovascular: Negative for chest pain and palpitations.   Gastrointestinal: Negative for abdominal pain, constipation, diarrhea, nausea and vomiting.   Genitourinary: Negative for dysuria.   Musculoskeletal: Negative for myalgias.   Skin: Negative for itching and rash.   Neurological: Negative for dizziness, tingling, seizures and headaches.   Endo/Heme/Allergies: Negative for polydipsia. Does not bruise/bleed easily.   Psychiatric/Behavioral: Negative for depression.       Objective:     Vitals:    10/19/18 0902   BP: 126/68   Pulse: (!) 59   Resp: 18   Temp: 98.1 ??F (36.7 ??C)   TempSrc: Oral   SpO2: 97%   Weight: 198 lb 3.2 oz (89.9 kg)   Height: 5\' 9"  (1.753 m)   PainSc:   0 - No pain        Physical  Exam  Vitals signs and nursing note reviewed.   Constitutional:       General: He is not in acute distress.     Appearance: Normal appearance. He is not ill-appearing.   HENT:      Head: Normocephalic and atraumatic.      Right Ear: External ear normal.       Left Ear: External ear normal.      Nose: Nose normal. No congestion or rhinorrhea.      Mouth/Throat:      Mouth: Mucous membranes are moist.      Pharynx: Oropharynx is clear. No oropharyngeal exudate or posterior oropharyngeal erythema.   Eyes:      General:         Right eye: No discharge.         Left eye: No discharge.      Conjunctiva/sclera: Conjunctivae normal.      Pupils: Pupils are equal, round, and reactive to light.   Neck:      Musculoskeletal: Normal range of motion and neck supple.      Vascular: No carotid bruit.   Cardiovascular:      Rate and Rhythm: Regular rhythm.      Pulses: Normal pulses.      Heart sounds: Normal heart sounds.   Pulmonary:      Effort: Pulmonary effort is normal.      Breath sounds: Normal breath sounds.   Abdominal:      General: There is no distension.      Palpations: Abdomen is soft.      Tenderness: There is no abdominal tenderness. There is no right CVA tenderness or left CVA tenderness.   Musculoskeletal: Normal range of motion.      Comments: Occasional pain and tenderness of the right heel due to past injury and hx of PVD.   Lymphadenopathy:      Cervical: No cervical adenopathy.   Skin:     General: Skin is warm and dry.   Neurological:      Mental Status: He is alert and oriented to person, place, and time.   Psychiatric:         Mood and Affect: Mood normal.          Assessment/ Plan:       ICD-10-CM ICD-9-CM    1. Prostate cancer ([place]) C61 185    2. Essential hypertension I10 401.9    3. Gastroesophageal reflux disease, esophagitis presence not specified K21.9 530.81    4. Mixed hyperlipidemia E78.2 272.2 LIPID PANEL   5. PVD (peripheral vascular disease) (HCC) I73.9 443.9    6. Hx of abdominal aortic aneurysm Z86.79 V12.59         Orders Placed This Encounter   ??? LIPID PANEL        I have reviewed the patient's medical history in detail and updated the computerized patient record.     Continue f/u with urology and radiology for Prostate cancer treatment.   Lipid panel drawn today.  Will f/u with vascular and colonoscopy after prostate cancer treatment.    We had a prolonged discussion about these complex clinical issues and went over the various important aspects to consider. All questions were answered.     Advised him to call back or return to office if symptoms do not improve, change in nature, or persist, otherwise RTO in 3 months to f/u HTN/BP; prostate cancer; hyperlipidemia.    He was given an after visit summary or  informed of MyChart Access which includes patient instructions, diagnoses, current medications, & vitals.  He expressed understanding with the diagnosis and plan and was given the opportunity to ask questions.    Blanchie Serve, DNP

## 2018-10-19 NOTE — Patient Instructions (Addendum)
Gastroesophageal Reflux Disease (GERD): Care Instructions  Your Care Instructions    Gastroesophageal reflux disease (GERD) is the backward flow of stomach acid into the esophagus. The esophagus is the tube that leads from your throat to your stomach. A one-way valve prevents the stomach acid from moving up into this tube. When you have GERD, this valve does not close tightly enough.  If you have mild GERD symptoms including heartburn, you may be able to control the problem with antacids or over-the-counter medicine. Changing your diet, losing weight, and making other lifestyle changes can also help reduce symptoms.  Follow-up care is a key part of your treatment and safety. Be sure to make and go to all appointments, and call your doctor if you are having problems. It's also a good idea to know your test results and keep a list of the medicines you take.  How can you care for yourself at home?  ?? Take your medicines exactly as prescribed. Call your doctor if you think you are having a problem with your medicine.  ?? Your doctor may recommend over-the-counter medicine. For mild or occasional indigestion, antacids, such as Tums, Gaviscon, Mylanta, or Maalox, may help. Your doctor also may recommend over-the-counter acid reducers, such as Pepcid AC, Tagamet HB, Zantac 75, or Prilosec. Read and follow all instructions on the label. If you use these medicines often, talk with your doctor.  ?? Change your eating habits.  ? It's best to eat several small meals instead of two or three large meals.  ? After you eat, wait 2 to 3 hours before you lie down.  ? Chocolate, mint, and alcohol can make GERD worse.  ? Spicy foods, foods that have a lot of acid (like tomatoes and oranges), and coffee can make GERD symptoms worse in some people. If your symptoms are worse after you eat a certain food, you may want to stop eating that food to see if your symptoms get better.   ?? Do not smoke or chew tobacco. Smoking can make GERD worse. If you need help quitting, talk to your doctor about stop-smoking programs and medicines. These can increase your chances of quitting for good.  ?? If you have GERD symptoms at night, raise the head of your bed 6 to 8 inches by putting the frame on blocks or placing a foam wedge under the head of your mattress. (Adding extra pillows does not work.)  ?? Do not wear tight clothing around your middle.  ?? Lose weight if you need to. Losing just 5 to 10 pounds can help.  When should you call for help?  Call your doctor now or seek immediate medical care if:  ?? ?? You have new or different belly pain.   ?? ?? Your stools are black and tarlike or have streaks of blood.   ??Watch closely for changes in your health, and be sure to contact your doctor if:  ?? ?? Your symptoms have not improved after 2 days.   ?? ?? Food seems to catch in your throat or chest.   Where can you learn more?  Go to http://www.healthwise.net/GoodHelpConnections.  Enter T927 in the search box to learn more about "Gastroesophageal Reflux Disease (GERD): Care Instructions."  Current as of: July 28, 2017  Content Version: 12.2  ?? 2006-2019 Healthwise, Incorporated. Care instructions adapted under license by [employer] (which disclaims liability or warranty for this information). If you have questions about a medical condition or this instruction, always ask   your healthcare professional. [place] any warranty or liability for your use of this information.         High Cholesterol: Care Instructions  Your Care Instructions    Cholesterol is a type of fat in your blood. It is needed for many body functions, such as making new cells. Cholesterol is made by your body. It also comes from food you eat. High cholesterol means that you have too much of the fat in your blood. This raises your risk of a heart attack and stroke.   LDL and HDL are part of your total cholesterol. LDL is the "bad" cholesterol. High LDL can raise your risk for heart disease, heart attack, and stroke. HDL is the "good" cholesterol. It helps clear bad cholesterol from the body. High HDL is linked with a lower risk of heart disease, heart attack, and stroke.  Your cholesterol levels help your doctor find out your risk for having a heart attack or stroke. You and your doctor can talk about whether you need to lower your risk and what treatment is best for you.  A heart-healthy lifestyle along with medicines can help lower your cholesterol and your risk. The way you choose to lower your risk will depend on how high your risk is for heart attack and stroke. It will also depend on how you feel about taking medicines.  Follow-up care is a key part of your treatment and safety. Be sure to make and go to all appointments, and call your doctor if you are having problems. It's also a good idea to know your test results and keep a list of the medicines you take.  How can you care for yourself at home?  ?? Eat a variety of foods every day. Good choices include fruits, vegetables, whole grains (like oatmeal), dried beans and peas, nuts and seeds, soy products (like tofu), and fat-free or low-fat dairy products.  ?? Replace butter, margarine, and hydrogenated or partially hydrogenated oils with olive and canola oils. (Canola oil margarine without trans fat is fine.)  ?? Replace red meat with fish, poultry, and soy protein (like tofu).  ?? Limit processed and packaged foods like chips, crackers, and cookies.  ?? Bake, broil, or steam foods. Don't fry them.  ?? Be physically active. Get at least 30 minutes of exercise on most days of the week. Walking is a good choice. You also may want to do other activities, such as running, swimming, cycling, or playing tennis or team sports.  ?? Stay at a healthy weight or lose weight by making the changes in eating  and physical activity listed above. Losing just a small amount of weight, even 5 to 10 pounds, can reduce your risk for having a heart attack or stroke.  ?? Do not smoke.  When should you call for help?  Watch closely for changes in your health, and be sure to contact your doctor if:  ?? ?? You need help making lifestyle changes.   ?? ?? You have questions about your medicine.   Where can you learn more?  Go to [email or web address].  Enter [phone] in the search box to learn more about "High Cholesterol: Care Instructions."  Current as of: December 28, 2017  Content Version: 12.2  ?? 2006-2019 Healthwise, Incorporated. Care instructions adapted under license by [employer] (which disclaims liability or warranty for this information). If you have questions about a medical condition or this instruction, always ask your healthcare professional. [employer] disclaims  any warranty or liability for your use of this information.         High Blood Pressure: Care Instructions  Overview    It's normal for blood pressure to go up and down throughout the day. But if it stays up, you have high blood pressure. Another name for high blood pressure is hypertension.  Despite what a lot of people think, high blood pressure usually doesn't cause headaches or make you feel dizzy or lightheaded. It usually has no symptoms. But it does increase your risk of stroke, heart attack, and other problems. You and your doctor will talk about your risks of these problems based on your blood pressure.  Your doctor will give you a goal for your blood pressure. Your goal will be based on your health and your age.  Lifestyle changes, such as eating healthy and being active, are always important to help lower blood pressure. You might also take medicine to reach your blood pressure goal.  Follow-up care is a key part of your treatment and safety. Be sure to make  and go to all appointments, and call your doctor if you are having problems. It's also a good idea to know your test results and keep a list of the medicines you take.  How can you care for yourself at home?  Medical treatment  ?? If you stop taking your medicine, your blood pressure will go back up. You may take one or more types of medicine to lower your blood pressure. Be safe with medicines. Take your medicine exactly as prescribed. Call your doctor if you think you are having a problem with your medicine.  ?? Talk to your doctor before you start taking aspirin every day. Aspirin can help certain people lower their risk of a heart attack or stroke. But taking aspirin isn't right for everyone, because it can cause serious bleeding.  ?? See your doctor regularly. You may need to see the doctor more often at first or until your blood pressure comes down.  ?? If you are taking blood pressure medicine, talk to your doctor before you take decongestants or anti-inflammatory medicine, such as ibuprofen. Some of these medicines can raise blood pressure.  ?? Learn how to check your blood pressure at home.  Lifestyle changes  ?? Stay at a healthy weight. This is especially important if you put on weight around the waist. Losing even 10 pounds can help you lower your blood pressure.  ?? If your doctor recommends it, get more exercise. Walking is a good choice. Bit by bit, increase the amount you walk every day. Try for at least 30 minutes on most days of the week. You also may want to swim, bike, or do other activities.  ?? Avoid or limit alcohol. Talk to your doctor about whether you can drink any alcohol.  ?? Try to limit how much sodium you eat to less than 2,300 milligrams (mg) a day. Your doctor may ask you to try to eat less than 1,500 mg a day.  ?? Eat plenty of fruits (such as bananas and oranges), vegetables, legumes, whole grains, and low-fat dairy products.   ?? Lower the amount of saturated fat in your diet. Saturated fat is found in animal products such as milk, cheese, and meat. Limiting these foods may help you lose weight and also lower your risk for heart disease.  ?? Do not smoke. Smoking increases your risk for heart attack and stroke. If you need help quitting, talk  to your doctor about stop-smoking programs and medicines. These can increase your chances of quitting for good.  When should you call for help?  Call  911 anytime you think you may need emergency care. This may mean having symptoms that suggest that your blood pressure is causing a serious heart or blood vessel problem. Your blood pressure may be over 180/120.  ??For example, call  911 if:  ?? ?? You have symptoms of a heart attack. These may include:  ? Chest pain or pressure, or a strange feeling in the chest.  ? Sweating.  ? Shortness of breath.  ? Nausea or vomiting.  ? Pain, pressure, or a strange feeling in the back, neck, jaw, or upper belly or in one or both shoulders or arms.  ? Lightheadedness or sudden weakness.  ? A fast or irregular heartbeat.   ?? ?? You have symptoms of a stroke. These may include:  ? Sudden numbness, tingling, weakness, or loss of movement in your face, arm, or leg, especially on only one side of your body.  ? Sudden vision changes.  ? Sudden trouble speaking.  ? Sudden confusion or trouble understanding simple statements.  ? Sudden problems with walking or balance.  ? A sudden, severe headache that is different from past headaches.   ?? ?? You have severe back or belly pain.   ??Do not wait until your blood pressure comes down on its own. Get help right away.  ??Call your doctor now or seek immediate care if:  ?? ?? Your blood pressure is much higher than normal (such as 180/120 or higher), but you don't have symptoms.   ?? ?? You think high blood pressure is causing symptoms, such as:  ? Severe headache.  ? Blurry vision.    ??Watch closely for changes in your health, and be sure to contact your doctor if:  ?? ?? Your blood pressure measures higher than your doctor recommends at least 2 times. That means the top number is higher or the bottom number is higher, or both.   ?? ?? You think you may be having side effects from your blood pressure medicine.   Where can you learn more?  Go to [email or web address].  Enter [phone] in the search box to learn more about "High Blood Pressure: Care Instructions."  Current as of: December 28, 2017  Content Version: 12.2  ?? 2006-2019 Healthwise, Incorporated. Care instructions adapted under license by [employer] (which disclaims liability or warranty for this information). If you have questions about a medical condition or this instruction, always ask your healthcare professional. [place] any warranty or liability for your use of this information.         Prostate Cancer: Care Instructions  Your Care Instructions    The prostate gland is a small, walnut-shaped organ that lies just below a man's bladder. It surrounds the urethra, the tube that carries urine from the bladder out of the body through the penis. Prostate cancer is the abnormal growth of cells in the prostate gland. Prostate cancer cells can spread within the prostate, to nearby lymph nodes and other tissues, and to other parts of the body.  When the cancer hasn't spread outside the prostate, it is called localized prostate cancer. With localized prostate cancer, your options depend on how likely it is that your cancer will grow. Tests will show if your cancer is likely to grow.  ?? Low-risk cancer isn't likely to grow right away. If your  cancer is low-risk, you can choose active surveillance. This means your cancer will be watched closely by your doctor with regular checkups and tests to see if the cancer grows. This choice allows you to delay having surgery or  radiation, often for many years. If the cancer grows very slowly, you may never need treatment.  ?? Medium-risk cancer is more likely to grow. Some men with this type of cancer may be able to choose active surveillance. Others may need to choose surgery or radiation.  ?? High-risk cancer is most likely to grow. If you have high-risk cancer, you will likely need to choose surgery or radiation.  If your cancer has already spread outside the prostate or to other parts of the body, then you may have other treatments, like chemotherapy or hormone therapy.  If you are over age 70 or have other serious health problems, like heart disease, you may choose not to have treatments to cure your cancer. Instead, you can just have treatments to manage your symptoms. This is called watchful waiting.  Finding out that you have cancer is scary. You may feel many emotions and may need some help coping. Seek out family, friends, and counselors for support. You also can do things at home to make yourself feel better while you go through treatment. Call the [facility] [phone]) or visit its website at [email or web address] for more information.  Follow-up care is a key part of your treatment and safety. Be sure to make and go to all appointments, and call your doctor if you are having problems. It's also a good idea to know your test results and keep a list of the medicines you take.  How can you care for yourself at home?  ?? Your doctor will talk to you about your treatment options. You may need to learn more about each of them before you can decide which treatment is best for you.  ?? Take your medicines exactly as prescribed. Call your doctor if you think you are having a problem with your medicine. You will get more details on the specific medicines your doctor prescribes.  ?? Eat healthy food. If you do not feel like eating, try to eat food that has protein and extra calories to keep up your strength and prevent weight  loss. Drink liquid meal replacements for extra calories and protein. Try to eat your main meal early.  ?? Take steps to control your stress and workload. Learn relaxation techniques.  ? Share your feelings. Stress and tension affect our emotions. By expressing your feelings to others, you may be able to understand and cope with them.  ? Consider joining a support group. Talking about a problem with your spouse, a good friend, or other people with similar problems is a good way to reduce tension and stress.  ? Express yourself through art. Try writing, crafts, dance, or art to relieve stress. Some dance, writing, or art groups may be available just for people who have cancer.  ? Be kind to your body and mind. Getting enough sleep, eating a healthy diet, and taking time to do things you enjoy can contribute to an overall feeling of balance in your life and can help reduce stress.  ? Get help if you need it. Discuss your concerns with your doctor or counselor.  ?? Get some physical activity every day, but do not get too tired. Keep doing the hobbies you enjoy as your energy allows.  ?? If you are vomiting  or have diarrhea:  ? Drink plenty of fluids (enough so that your urine is light yellow or clear like water) to prevent dehydration. Choose water and other caffeine-free clear liquids. If you have kidney, heart, or liver disease and have to limit fluids, talk with your doctor before you increase the amount of fluids you drink.  ? When you are able to eat, try clear soups, mild foods, and liquids until all symptoms are gone for 12 to 48 hours. Jell-O, dry toast, crackers, and cooked cereal are also good choices.  ?? If you have not already done so, prepare a list of advance directives. Advance directives are instructions to your doctor and family members about what kind of care you want if you become unable to speak or express yourself.  When should you call for help?   Call 911 anytime you think you may need emergency care. For example, call if:  ?? ?? You passed out (lost consciousness).   ??Call your doctor now or seek immediate medical care if:  ?? ?? You have new or worse pain.   ?? ?? You have new symptoms, such as a cough, belly pain, vomiting, diarrhea, or a rash.   ?? ?? You have symptoms of a urinary tract infection. For example:  ? You have blood or pus in your urine.  ? You have pain in your back just below your rib cage. This is called flank pain.  ? You have a fever, chills, or body aches.  ? It hurts to urinate.  ? You have groin or belly pain.   ??Watch closely for changes in your health, and be sure to contact your doctor if:  ?? ?? You have swollen glands in your armpits, groin, or neck.   ?? ?? You have trouble controlling your urine.   ?? ?? You do not get better as expected.   Where can you learn more?  Go to [email or web address].  Enter V141 in the search box to learn more about "Prostate Cancer: Care Instructions."  Current as of: September 08, 2017  Content Version: 12.2  ?? 2006-2019 Healthwise, Incorporated. Care instructions adapted under license by [employer] (which disclaims liability or warranty for this information). If you have questions about a medical condition or this instruction, always ask your healthcare professional. [place] any warranty or liability for your use of this information.

## 2018-10-19 NOTE — Progress Notes (Signed)
Chief Complaint   Patient presents with   ??? Hypertension     follow up           1. Have you been to the ER, urgent care clinic since your last visit?  Hospitalized since your last visit?No    2. Have you seen or consulted any other health care providers outside of the [facility] since your last visit?  Include any pap smears or colon screening. Yes Where: Radiologist Reason for visit: prostate cancer

## 2018-10-20 LAB — LIPID PANEL
Cholesterol, Total: 187 mg/dL (ref 100–199)
Cholesterol, total: 187 mg/dL (ref 100–199)
HDL Cholesterol: 40 mg/dL (ref >39)
HDL: 40 mg/dL (ref >39)
LDL Calculated: 126 mg/dL — ABNORMAL HIGH (ref 0–99)
LDL, calculated: 126 mg/dL — ABNORMAL HIGH (ref 0–99)
Triglyceride: 103 mg/dL (ref 0–149)
Triglycerides: 103 mg/dL (ref 0–149)
VLDL Cholesterol Calculated: 21 mg/dL (ref 5–40)
VLDL, calculated: 21 mg/dL (ref 5–40)

## 2018-10-20 LAB — CVD REPORT

## 2019-01-18 ENCOUNTER — Encounter: Attending: Internal Medicine | Primary: Internal Medicine

## 2019-01-18 ENCOUNTER — Telehealth: Admit: 2019-01-18 | Discharge: 2019-01-18 | Payer: MEDICARE | Attending: Internal Medicine | Primary: Internal Medicine

## 2019-01-18 ENCOUNTER — Telehealth: Attending: Internal Medicine | Primary: Internal Medicine

## 2019-01-18 DIAGNOSIS — I10 Essential (primary) hypertension: Secondary | ICD-10-CM

## 2019-01-18 NOTE — Addendum Note (Signed)
Addended by: Luane School on: 01/31/2019 08:49 AM     Modules accepted: Level of Service

## 2019-01-18 NOTE — Progress Notes (Signed)
 1. Have you been to the ER, urgent care clinic since your last visit?  Hospitalized since your last visit?no    2. Have you seen or consulted any other health care providers outside of the [facility] since your last visit?  Include any pap smears or colon screening. No    3 most recent PHQ Screens 07/05/2018   Little interest or pleasure in doing things Not at all   Feeling down, depressed, irritable, or hopeless Not at all   Total Score PHQ 2 0     Chief Complaint   Patient presents with   . Cholesterol Problem   . Hypertension   . GERD

## 2019-01-18 NOTE — Progress Notes (Signed)
Damon [name] is a 68 y.o. male evaluated via telephone on 01/18/2019.      Consent:  He and/or health care decision maker is aware that he may receive a bill for this telephone service, depending on his insurance coverage, and has provided verbal consent to proceed: Yes      Documentation:  I communicated with the patient and/or health care decision maker about bp.   Details of this discussion including any medical advice provided: bp      I affirm this is a Patient Initiated Episode with an Established Patient who has not had a related appointment within my department in the past 7 days or scheduled within the next 24 hours.    Total Time: minutes: 5-10 minutes      Note: not billable if this call serves to triage the patient into an appointment for the relevant concern      Helayne Seminole., MD      Hypertension Review:  The patient has hypertension .  Diet and Lifestyle: generally follows a low sodium diet, exercises sporadically  Home BP Monitoring: is not measured at home.  Pertinent ROS: taking medications as instructed, no medication side effects noted, no TIA's, no chest pain on exertion, no dyspnea on exertion, no swelling of ankles.    Dyslipidemia Review:  Patient presents for evaluation of lipids.  Compliance with treatment thus far has been excellent.  A repeat fasting lipid profile was done.  The patient does not use medications that may worsen dyslipidemias . The patient exercises sporadically.    BPH Review:  He has no burning on urination,dysuria or dribbling reported.He continues to report frequency on urination.      Review of Systems  Constitutional: negative for fevers, chills, anorexia and weight loss  Eyes:   negative for visual disturbance and irritation  ENT:   negative for tinnitus,sore throat,nasal congestion,ear pains.hoarseness  Respiratory:  negative for cough, hemoptysis, dyspnea,wheezing  CV:   negative for chest pain, palpitations, lower extremity edema  GI:   negative for  nausea, vomiting, diarrhea, abdominal pain,melena  Endo:               negative for polyuria,polydipsia,polyphagia,heat intolerance  Genitourinary: negative for frequency, dysuria and hematuria  Integument:  negative for rash and pruritus  Hematologic:  negative for easy bruising and gum/nose bleeding  Musculoskel: negative for myalgias, arthralgias, back pain, muscle weakness, joint pain  Neurological:  negative for headaches, dizziness, vertigo, memory problems and gait   Behavl/Psych: negative for feelings of anxiety, depression, mood changes    Past Medical History:   Diagnosis Date   ??? 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 Cancer (HCC)     prostate   ??? GERD (gastroesophageal reflux disease)    ??? HTN (hypertension)    ??? Hyperlipidemia      Past Surgical History:   Procedure Laterality Date   ??? HX ANGIOPLASTY      iliac, prior to percutaneous Endovascular Aortic Repair (EVAR)   ??? HX ORTHOPAEDIC  04/2018    heel surgery   ??? VASCULAR SURGERY PROCEDURE UNLIST  06/18/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     Social History     Socioeconomic History   ??? Marital status: SINGLE     Spouse name: Not on file   ???  Number of children: Not on file   ??? Years of education: Not on file   ??? Highest education level: Not on file   Tobacco Use   ??? Smoking status: Former Smoker     Packs/day: 0.50     Years: 44.00     Pack years: 22.00     Types: Cigarettes     Start date: 12/11/1967   ??? Smokeless tobacco: Former Systems developer     Quit date: 05/22/2015   ??? Tobacco comment: Pt. quit smoking 04/2015   Substance and Sexual Activity   ??? Alcohol use: No     Alcohol/week: 0.0 standard drinks     Comment: quit 05/22/2015   ??? Drug use: No     Family History   Problem Relation Age of Onset   ??? Hypertension Mother    ??? Hypertension Sister    ??? Kidney Disease Sister    ??? Hypertension Brother    ??? Heart Disease Brother    ???  Kidney Disease Sister         on dialysis   ??? Heart Disease Sister    ??? Hypertension Sister    ??? Diabetes Father    ??? Heart Disease Father    ??? Hypertension Father    ??? Diabetes Paternal Uncle         deceased, 4's, heart trouble     Current Outpatient Medications   Medication Sig Dispense Refill   ??? omeprazole (PRILOSEC) 40 mg capsule TAKE ONE CAPSULE BY MOUTH ONCE DAILY FOR  YOUR  STOMACH 90 Cap 3   ??? lovastatin (MEVACOR) 40 mg tablet TAKE ONE TABLET BY MOUTH ONCE DAILY AT  NIGHT 90 Tab 3   ??? lisinopril-hydroCHLOROthiazide (PRINZIDE, ZESTORETIC) 20-25 mg per tablet One tab by mouth daily 90 Tab 3   ??? amLODIPine (NORVASC) 5 mg tablet TAKE ONE TABLET BY MOUTH ONCE DAILY FOR BLOOD PRESSURE 90 Tab 3   ??? aspirin delayed-release 81 mg tablet Take 1 Tab by mouth daily. 90 Tab 3   ??? tamsulosin (FLOMAX) 0.4 mg capsule Take 1 Cap by mouth daily. 90 Cap 3     No Known Allergies    Objective:  There were no vitals taken for this visit.      Results for orders placed or performed in visit on 10/19/18   LIPID PANEL   Result Value Ref Range    Cholesterol, total 187 100 - 199 mg/dL    Triglyceride 103 0 - 149 mg/dL    HDL Cholesterol 40 >39 mg/dL    VLDL, calculated 21 5 - 40 mg/dL    LDL, calculated 126 (H) 0 - 99 mg/dL   CVD REPORT   Result Value Ref Range    INTERPRETATION Note        Assessment/Plan:    ICD-10-CM ICD-9-CM    1. Essential hypertension I10 401.9    2. Mixed hyperlipidemia E78.2 272.2    3. Prostate cancer ([place]) C61 185      No orders of the defined types were placed in this encounter.    call if any problems,  There are no Patient Instructions on file for this visit.         I have reviewed with the patient details of the assessment and plan and all questions were answered. Relevent patient education was performed.The most recent lab findings were reviewed with the patient.    An After Visit Summary was printed and given to the patient.

## 2019-01-18 NOTE — Addendum Note (Signed)
Addendum  Note by Luane School at 01/18/19 1030                Author: Luane School  Service: --  Author Type: --       Filed: 01/31/19 0849  Encounter Date: 01/18/2019  Status: Signed          Editor: Luane School          Addended by: Luane School on: 01/31/2019 08:49 AM    Modules accepted: Level of Service

## 2019-01-18 NOTE — Progress Notes (Signed)
Damon [name] is a 68 y.o. male evaluated via telephone on 01/18/2019.      Consent:  He and/or health care decision maker is aware that he may receive a bill for this telephone service, depending on his insurance coverage, and has provided verbal consent to proceed: Yes      Documentation:  I communicated with the patient and/or health care decision maker about bp.   Details of this discussion including any medical advice provided: bp      I affirm this is a Patient Initiated Episode with an Established Patient who has not had a related appointment within my department in the past 7 days or scheduled within the next 24 hours.    Total Time: minutes: 5-10 minutes      Note: not billable if this call serves to triage the patient into an appointment for the relevant concern      Helayne Seminole., MD      Hypertension Review:  The patient has hypertension .  Diet and Lifestyle: generally follows a low sodium diet, exercises sporadically  Home BP Monitoring: is not measured at home.  Pertinent ROS: taking medications as instructed, no medication side effects noted, no TIA's, no chest pain on exertion, no dyspnea on exertion, no swelling of ankles.    Dyslipidemia Review:  Patient presents for evaluation of lipids.  Compliance with treatment thus far has been excellent.  A repeat fasting lipid profile was done.  The patient does not use medications that may worsen dyslipidemias . The patient exercises sporadically.    BPH Review:  He has no burning on urination,dysuria or dribbling reported.He continues to report frequency on urination.      Review of Systems  Constitutional: negative for fevers, chills, anorexia and weight loss  Eyes:   negative for visual disturbance and irritation  ENT:   negative for tinnitus,sore throat,nasal congestion,ear pains.hoarseness  Respiratory:  negative for cough, hemoptysis, dyspnea,wheezing  CV:   negative for chest pain, palpitations, lower extremity edema   GI:   negative for nausea, vomiting, diarrhea, abdominal pain,melena  Endo:               negative for polyuria,polydipsia,polyphagia,heat intolerance  Genitourinary: negative for frequency, dysuria and hematuria  Integument:  negative for rash and pruritus  Hematologic:  negative for easy bruising and gum/nose bleeding  Musculoskel: negative for myalgias, arthralgias, back pain, muscle weakness, joint pain  Neurological:  negative for headaches, dizziness, vertigo, memory problems and gait   Behavl/Psych: negative for feelings of anxiety, depression, mood changes    Past Medical History:   Diagnosis Date   ??? 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 Cancer (HCC)     prostate   ??? GERD (gastroesophageal reflux disease)    ??? HTN (hypertension)    ??? Hyperlipidemia      Past Surgical History:   Procedure Laterality Date   ??? HX ANGIOPLASTY      iliac, prior to percutaneous Endovascular Aortic Repair (EVAR)   ??? HX ORTHOPAEDIC  04/2018    heel surgery   ??? VASCULAR SURGERY PROCEDURE UNLIST  06/18/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     Social History     Socioeconomic History   ??? Marital status: SINGLE     Spouse name: Not on file   ???  Number of children: Not on file   ??? Years of education: Not on file   ??? Highest education level: Not on file   Tobacco Use   ??? Smoking status: Former Smoker     Packs/day: 0.50     Years: 44.00     Pack years: 22.00     Types: Cigarettes     Start date: 12/11/1967   ??? Smokeless tobacco: Former Systems developer     Quit date: 05/22/2015   ??? Tobacco comment: Pt. quit smoking 04/2015   Substance and Sexual Activity   ??? Alcohol use: No     Alcohol/week: 0.0 standard drinks     Comment: quit 05/22/2015   ??? Drug use: No     Family History   Problem Relation Age of Onset   ??? Hypertension Mother    ??? Hypertension Sister    ??? Kidney Disease Sister    ??? Hypertension Brother     ??? Heart Disease Brother    ??? Kidney Disease Sister         on dialysis   ??? Heart Disease Sister    ??? Hypertension Sister    ??? Diabetes Father    ??? Heart Disease Father    ??? Hypertension Father    ??? Diabetes Paternal Uncle         deceased, 85's, heart trouble     Current Outpatient Medications   Medication Sig Dispense Refill   ??? omeprazole (PRILOSEC) 40 mg capsule TAKE ONE CAPSULE BY MOUTH ONCE DAILY FOR  YOUR  STOMACH 90 Cap 3   ??? lovastatin (MEVACOR) 40 mg tablet TAKE ONE TABLET BY MOUTH ONCE DAILY AT  NIGHT 90 Tab 3   ??? lisinopril-hydroCHLOROthiazide (PRINZIDE, ZESTORETIC) 20-25 mg per tablet One tab by mouth daily 90 Tab 3   ??? amLODIPine (NORVASC) 5 mg tablet TAKE ONE TABLET BY MOUTH ONCE DAILY FOR BLOOD PRESSURE 90 Tab 3   ??? aspirin delayed-release 81 mg tablet Take 1 Tab by mouth daily. 90 Tab 3   ??? tamsulosin (FLOMAX) 0.4 mg capsule Take 1 Cap by mouth daily. 90 Cap 3     No Known Allergies    Objective:  There were no vitals taken for this visit.      Results for orders placed or performed in visit on 10/19/18   LIPID PANEL   Result Value Ref Range    Cholesterol, total 187 100 - 199 mg/dL    Triglyceride 103 0 - 149 mg/dL    HDL Cholesterol 40 >39 mg/dL    VLDL, calculated 21 5 - 40 mg/dL    LDL, calculated 126 (H) 0 - 99 mg/dL   CVD REPORT   Result Value Ref Range    INTERPRETATION Note        Assessment/Plan:    ICD-10-CM ICD-9-CM    1. Essential hypertension I10 401.9    2. Mixed hyperlipidemia E78.2 272.2    3. Prostate cancer ([place]) C61 185      No orders of the defined types were placed in this encounter.    call if any problems,  There are no Patient Instructions on file for this visit.         I have reviewed with the patient details of the assessment and plan and all questions were answered. Relevent patient education was performed.The most recent lab findings were reviewed with the patient.    An After Visit Summary was printed and given to the patient.

## 2019-01-18 NOTE — Progress Notes (Signed)
1. Have you been to the ER, urgent care clinic since your last visit?  Hospitalized since your last visit?no    2. Have you seen or consulted any other health care providers outside of the [facility] since your last visit?  Include any pap smears or colon screening. No    3 most recent PHQ Screens 07/05/2018   Little interest or pleasure in doing things Not at all   Feeling down, depressed, irritable, or hopeless Not at all   Total Score PHQ 2 0     Chief Complaint   Patient presents with   ??? Cholesterol Problem   ??? Hypertension   ??? GERD

## 2019-02-22 ENCOUNTER — Encounter

## 2019-02-22 NOTE — Telephone Encounter (Signed)
PCP is Neta Ehlers, NP.

## 2019-02-23 ENCOUNTER — Encounter

## 2019-02-23 MED ORDER — TAMSULOSIN SR 0.4 MG 24 HR CAP
0.4 mg | ORAL_CAPSULE | Freq: Every day | ORAL | 3 refills | Status: DC
Start: 2019-02-23 — End: 2019-07-18

## 2019-02-23 MED ORDER — OMEPRAZOLE 40 MG CAP, DELAYED RELEASE
40 mg | ORAL_CAPSULE | ORAL | 3 refills | Status: DC
Start: 2019-02-23 — End: 2019-07-18

## 2019-02-23 MED ORDER — ASPIRIN 81 MG TAB, DELAYED RELEASE
81 mg | ORAL_TABLET | Freq: Every day | ORAL | 3 refills | Status: AC
Start: 2019-02-23 — End: ?

## 2019-02-23 MED ORDER — AMLODIPINE 5 MG TAB
5 mg | ORAL_TABLET | ORAL | 3 refills | Status: DC
Start: 2019-02-23 — End: 2020-02-19

## 2019-02-23 MED ORDER — LISINOPRIL-HYDROCHLOROTHIAZIDE 20 MG-25 MG TAB
20-25 mg | ORAL_TABLET | ORAL | 3 refills | Status: DC
Start: 2019-02-23 — End: 2020-02-19

## 2019-02-23 MED ORDER — LOVASTATIN 40 MG TAB
40 mg | ORAL_TABLET | ORAL | 3 refills | Status: DC
Start: 2019-02-23 — End: 2019-07-18

## 2019-02-23 NOTE — Telephone Encounter (Signed)
Needs to call his PCP Dr. Lucia Estelle.

## 2019-02-23 NOTE — Telephone Encounter (Signed)
Per chart review, prescriptions with several refills for both of the medications were sent to patient's [facility] in [place] on 07-05-2018 by his new PCP. Yonah Tangeman Bea Graff, RN

## 2019-03-17 ENCOUNTER — Inpatient Hospital Stay: Admit: 2019-03-17 | Primary: Internal Medicine

## 2019-07-18 ENCOUNTER — Encounter

## 2019-07-18 MED ORDER — TAMSULOSIN SR 0.4 MG 24 HR CAP
0.4 mg | ORAL_CAPSULE | Freq: Every day | ORAL | 3 refills | Status: DC
Start: 2019-07-18 — End: 2020-12-24

## 2019-07-18 MED ORDER — OMEPRAZOLE 40 MG CAP, DELAYED RELEASE
40 mg | ORAL_CAPSULE | ORAL | 3 refills | Status: DC
Start: 2019-07-18 — End: 2020-10-09

## 2019-07-18 MED ORDER — LOVASTATIN 40 MG TAB
40 mg | ORAL_TABLET | ORAL | 3 refills | Status: DC
Start: 2019-07-18 — End: 2020-10-09

## 2019-08-16 IMAGING — DX DG FOOT COMPLETE 3+V*R*
3 series · 3 of 3 positions shown · non-contrast
Comparison: None.

CLINICAL DATA: Pain after fall from ladder

EXAM:
RIGHT FOOT COMPLETE - 3+ VIEW

[foot ap]
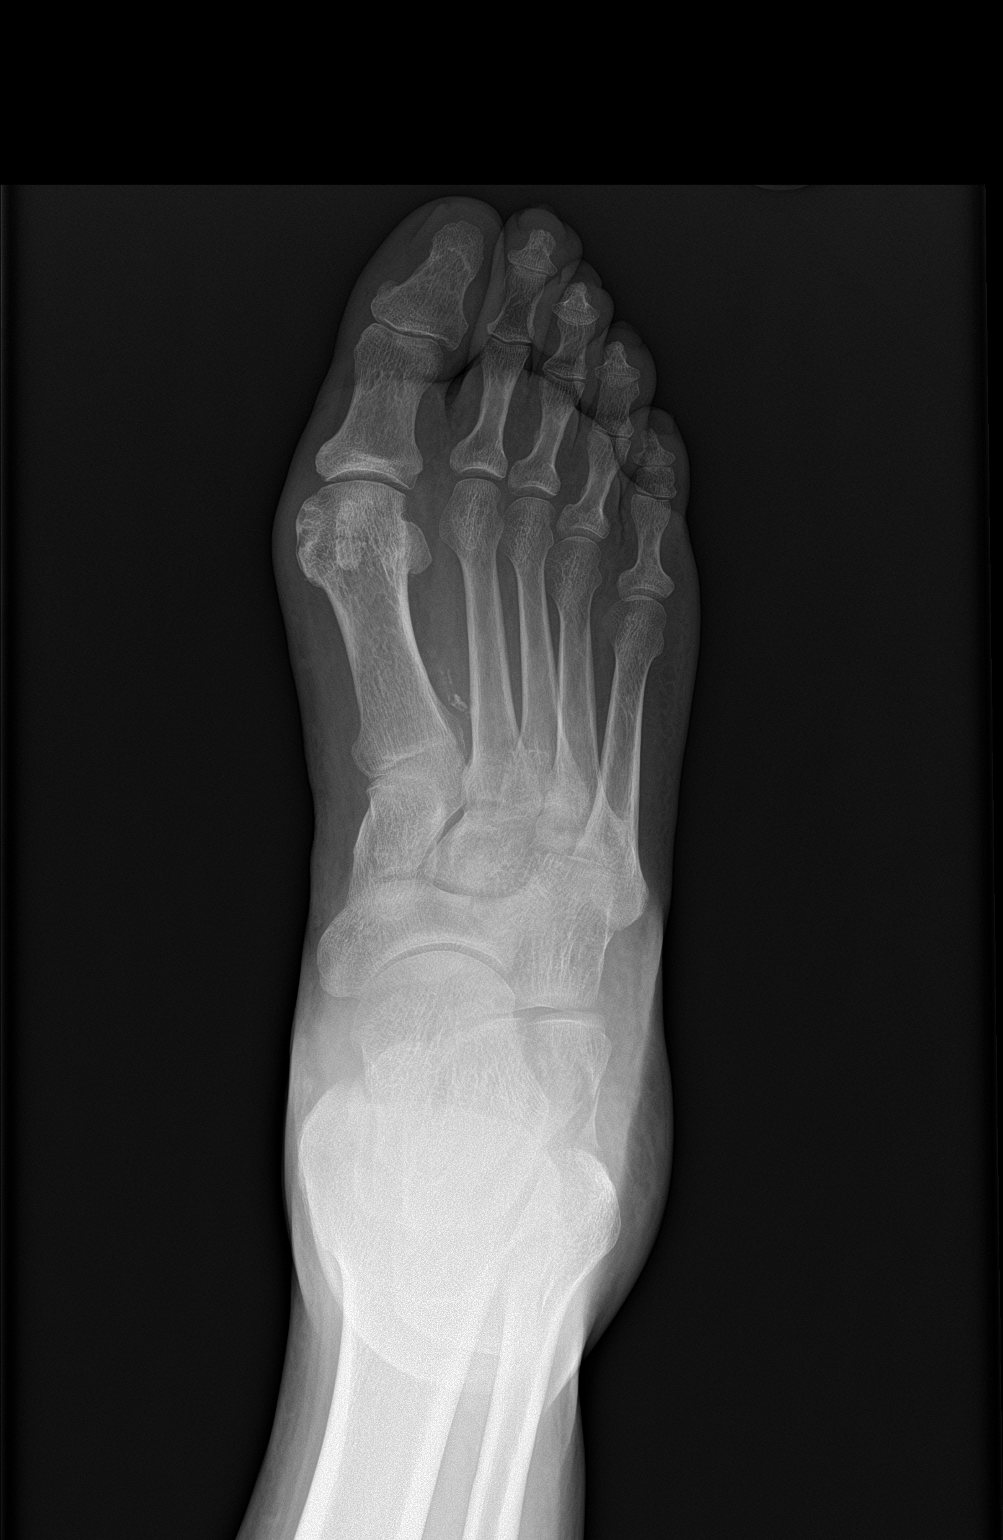

[foot obl]
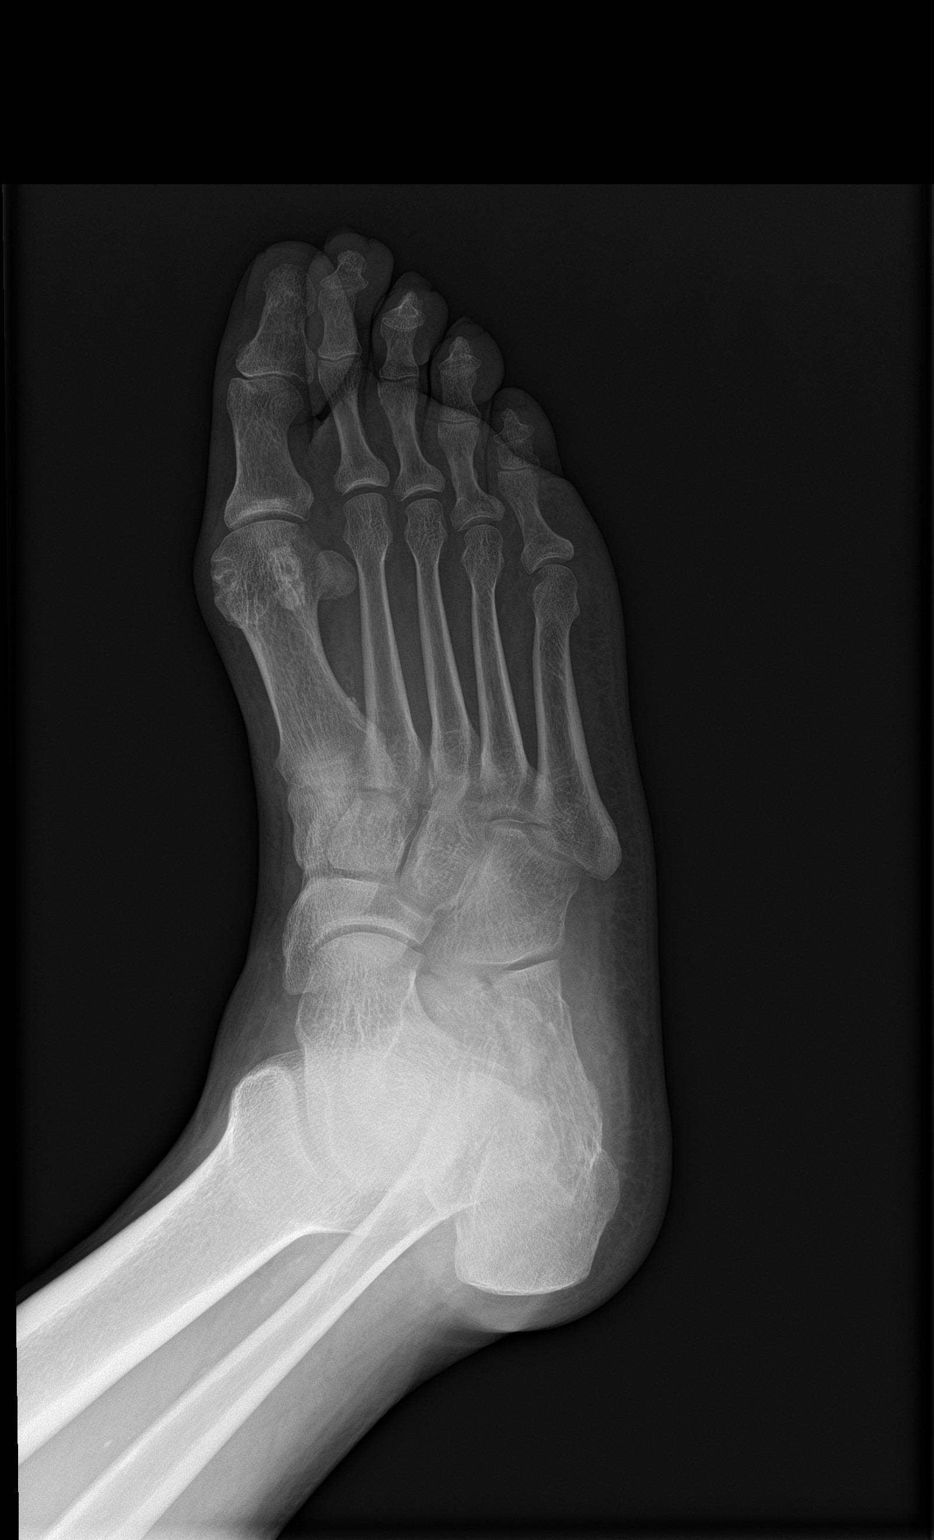

[foot lat]
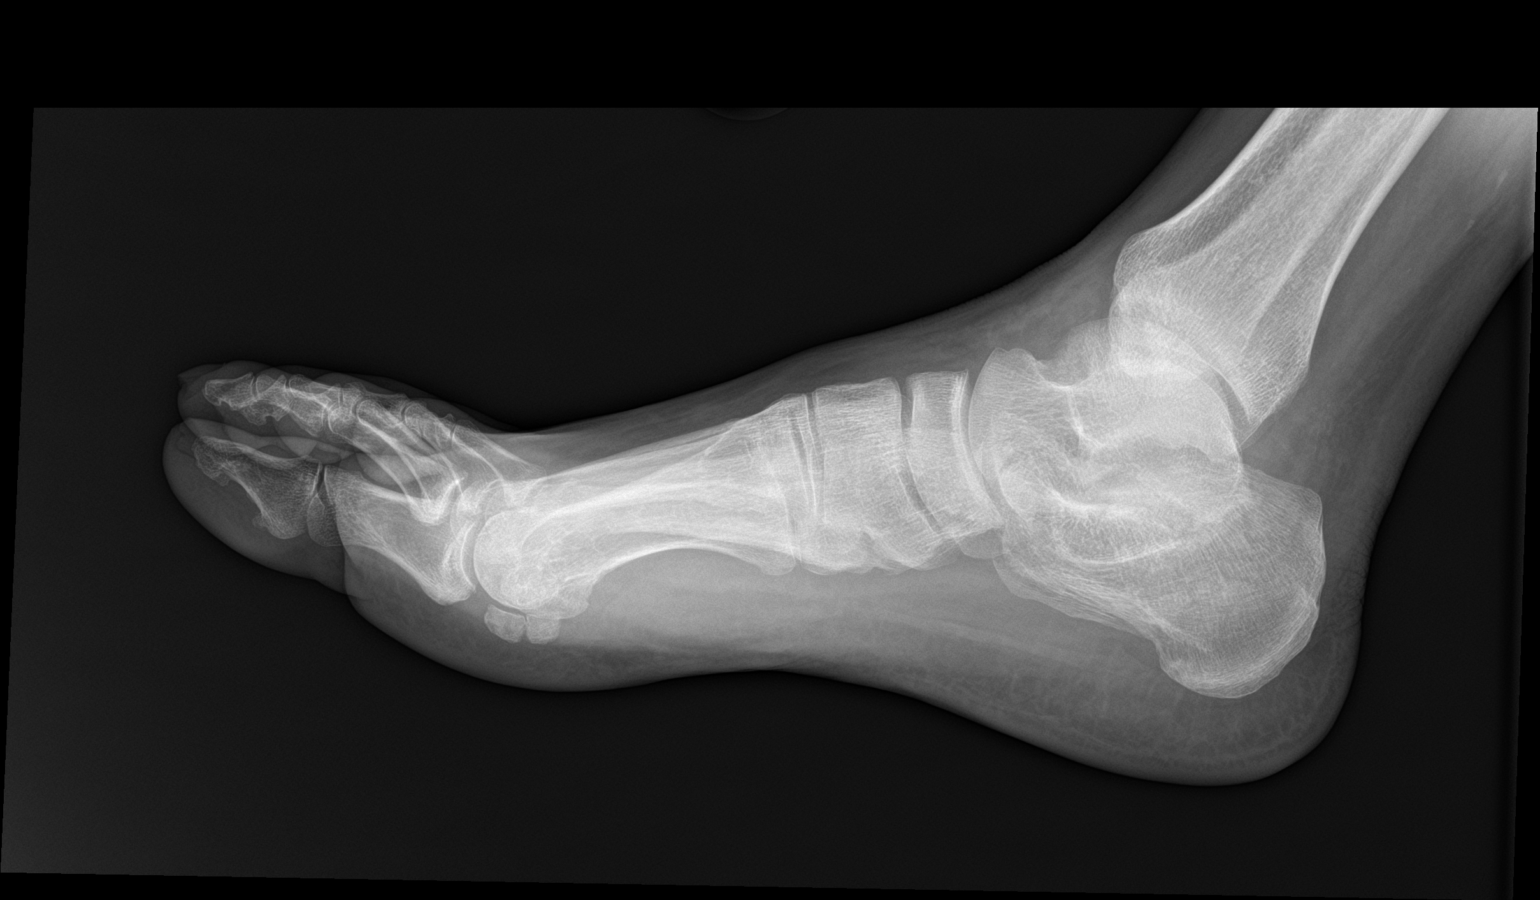

[3 of 3 positions shown; findings below may reference images not displayed]

FINDINGS: Frontal, oblique, and lateral views were obtained. There is no
appreciable fracture or dislocation. There is mild hallux valgus
deformity at the first MTP joint. There is mild osteoarthritic
change in the first MTP joint. Other joint spaces appear normal. No
erosive change. Arterial vascular calcification is noted between the
first and second metatarsals.
IMPRESSION: Mild osteoarthritic change at the first MTP joint with hallux valgus
deformity at the first MTP joint. Other joint spaces appear
unremarkable. No evident fracture or dislocation. Arterial vascular
calcification between the first and second metatarsals noted.

## 2019-08-16 IMAGING — DX DG ANKLE COMPLETE 3+V*R*
3 series · 3 of 3 positions shown · non-contrast
Comparison: 05/03/2018

CLINICAL DATA: Fall from ladder

EXAM:
RIGHT ANKLE - COMPLETE 3+ VIEW

[ankle ap]
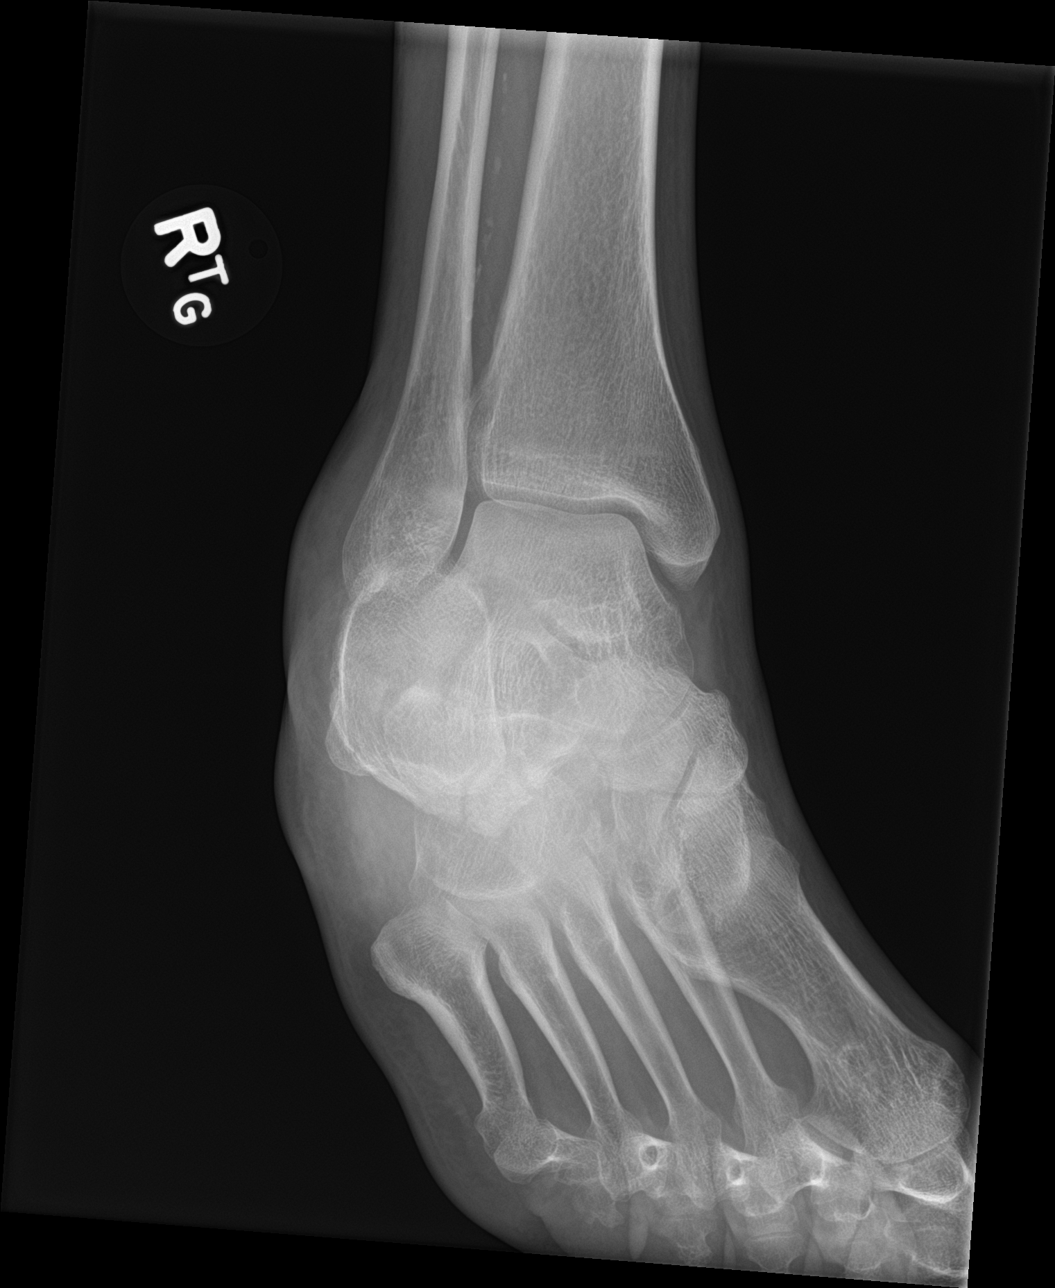

[ankle obl]
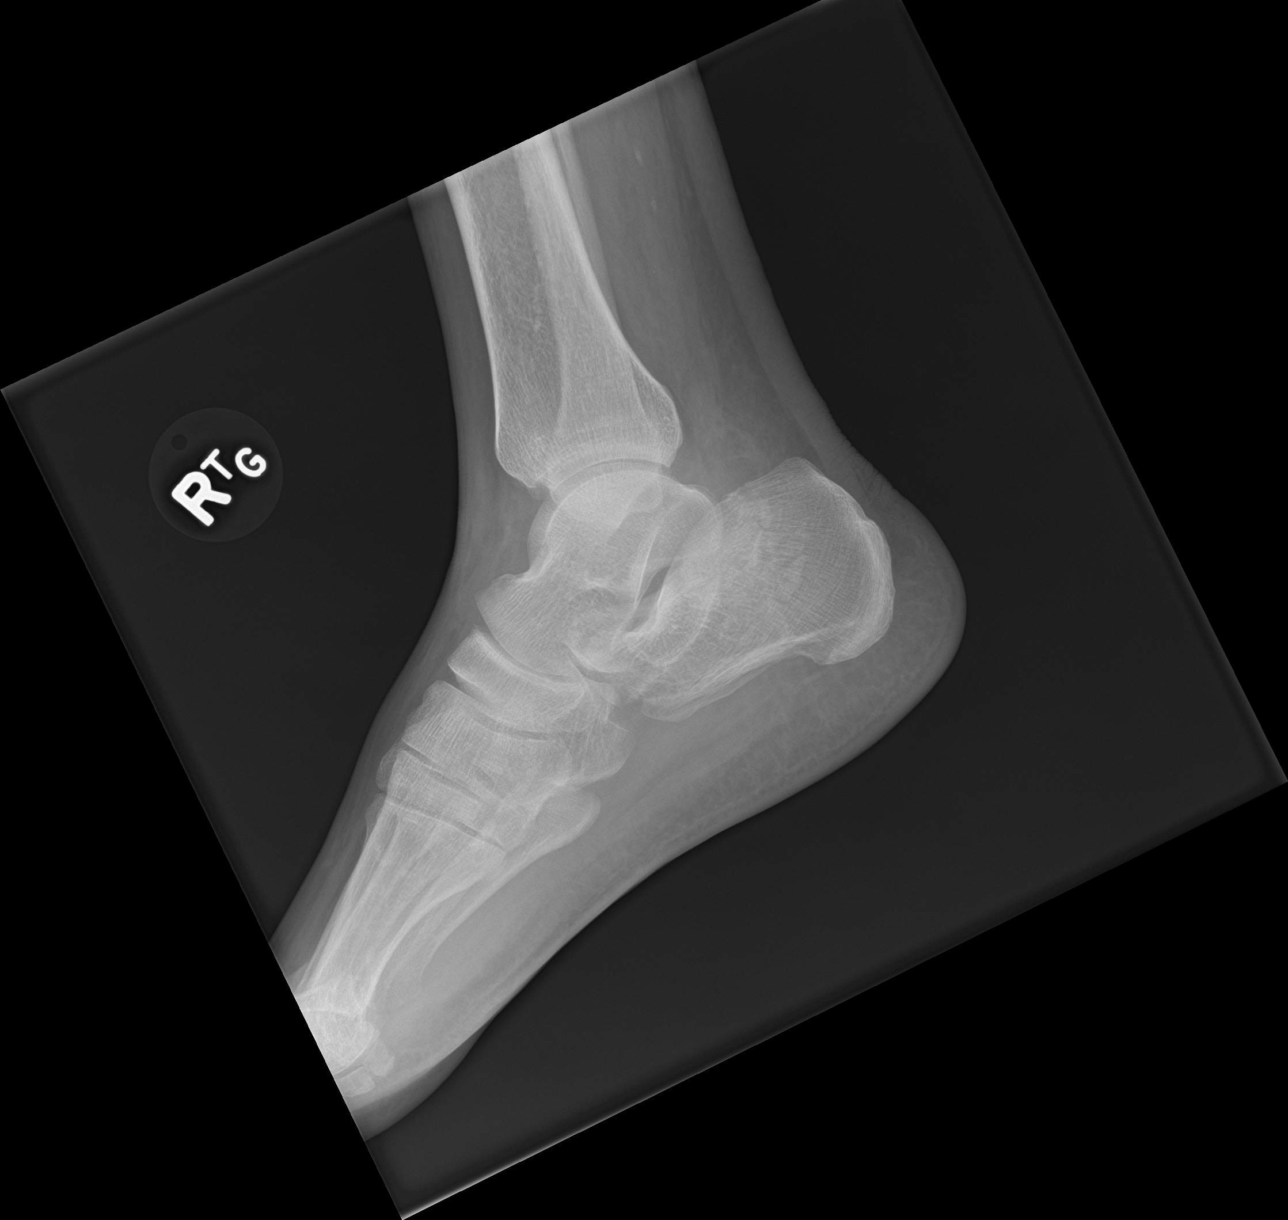

[ankle lat]
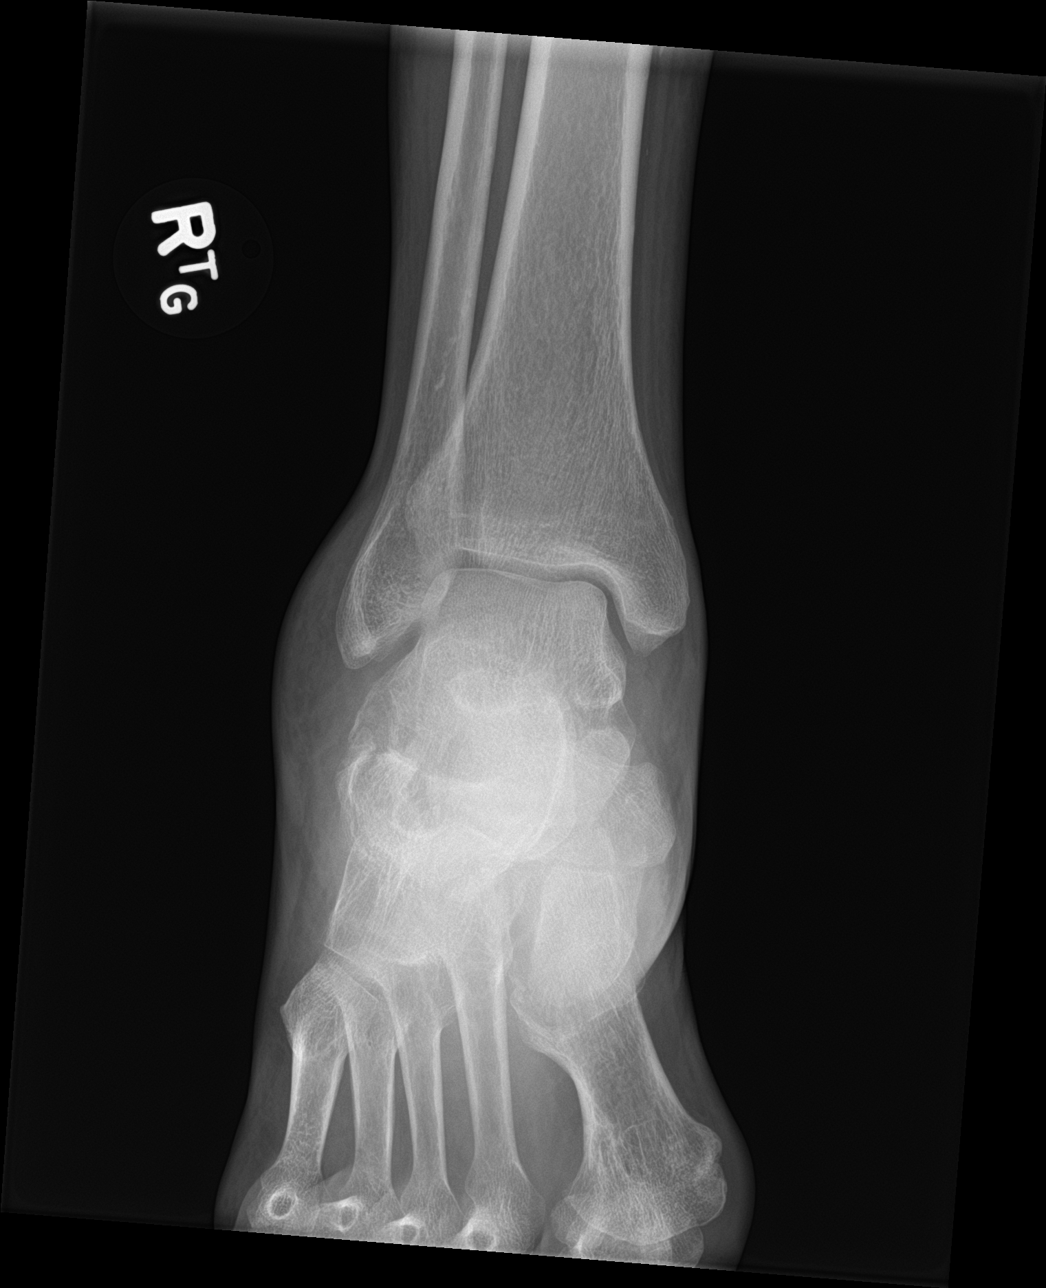

[3 of 3 positions shown; findings below may reference images not displayed]

FINDINGS: Ankle mortise is symmetric. There is lateral soft tissue swelling.
Irregular lucencies within the anterior and mid calcaneus, suspect
for calcaneal fracture.
IMPRESSION: Acute slightly impacted calcaneus fracture.

## 2019-11-25 ENCOUNTER — Ambulatory Visit: Payer: Self-pay | Attending: Internal Medicine

## 2019-11-25 DIAGNOSIS — Z23 Encounter for immunization: Secondary | ICD-10-CM | POA: Insufficient documentation

## 2019-11-25 NOTE — Progress Notes (Signed)
   Covid-19 Vaccination Clinic  Name:  Jose Fernandez    MRN: [id number] DOB: [birth year]  11/25/2019  Mr. Jose Fernandez was observed post Covid-19 immunization for 15 minutes without incident. He was provided with Vaccine Information Sheet and instruction to access the V-Safe system.   Mr. Jose Fernandez was instructed to call 911 with any severe reactions post vaccine: Marland Kitchen Difficulty breathing  . Swelling of face and throat  . A fast heartbeat  . A bad rash all over body  . Dizziness and weakness   Immunizations Administered    Name Date Dose VIS Date Route   Pfizer COVID-19 Vaccine 11/25/2019 10:11 AM 0.3 mL 09/01/2019 Intramuscular   Manufacturer: [employer], [employer]   Lot: [id number]   NDC: [address]-9

## 2019-12-01 ENCOUNTER — Inpatient Hospital Stay: Admit: 2019-12-01 | Payer: MEDICARE | Primary: Internal Medicine

## 2019-12-16 ENCOUNTER — Ambulatory Visit: Payer: Self-pay | Attending: Internal Medicine

## 2019-12-16 DIAGNOSIS — Z23 Encounter for immunization: Secondary | ICD-10-CM

## 2019-12-16 NOTE — Progress Notes (Signed)
   Covid-19 Vaccination Clinic  Name:  Jose Fernandez    MRN: [id number] DOB: [birth year]  12/16/2019  Jose Fernandez was observed post Covid-19 immunization for 15 minutes without incident. He was provided with Vaccine Information Sheet and instruction to access the V-Safe system.   Jose Fernandez was instructed to call 911 with any severe reactions post vaccine: Marland Kitchen Difficulty breathing  . Swelling of face and throat  . A fast heartbeat  . A bad rash all over body  . Dizziness and weakness   Immunizations Administered    Name Date Dose VIS Date Route   Pfizer COVID-19 Vaccine 12/16/2019 11:05 AM 0.3 mL 09/01/2019 Intramuscular   Manufacturer: [employer], [employer]   Lot: [id number]   NDC: [address]-5

## 2020-02-14 ENCOUNTER — Encounter

## 2020-02-14 NOTE — Telephone Encounter (Signed)
Pt left a voice message requesting refills.     Last visit 01/18/2019 Virtual visit MD Chilton Si   Next appointment Nothing scheduled   Previous refill encounter(s)   02/23/2019:  - Prinzide #90 with 3 refills,   - Aspirin-DR #90 with 3 refills,   - Norvasc #90 with 3 refills     Requested Prescriptions     Pending Prescriptions Disp Refills   . lisinopril-hydroCHLOROthiazide (PRINZIDE, ZESTORETIC) 20-25 mg per tablet 90 Tablet 0     Sig: Take 1 Tablet by mouth daily.   Marland Kitchen aspirin delayed-release 81 mg tablet 90 Tablet 3     Sig: Take 1 Tablet by mouth daily.   Marland Kitchen amLODIPine (NORVASC) 5 mg tablet 90 Tablet 0     Sig: TAKE ONE TABLET BY MOUTH ONCE DAILY FOR BLOOD PRESSURE

## 2020-02-14 NOTE — Telephone Encounter (Signed)
 Requested Prescriptions     Pending Prescriptions Disp Refills   . lisinopril -hydroCHLOROthiazide  (PRINZIDE , ZESTORETIC ) 20-25 mg per tablet 90 Tablet 3     Sig: One tab by mouth daily

## 2020-02-14 NOTE — Telephone Encounter (Signed)
Requested Prescriptions     Pending Prescriptions Disp Refills   ??? lisinopril-hydroCHLOROthiazide (PRINZIDE, ZESTORETIC) 20-25 mg per tablet 90 Tablet 3     Sig: One tab by mouth daily

## 2020-02-14 NOTE — Telephone Encounter (Signed)
Pt left a voice message requesting refills.     Last visit 01/18/2019 Virtual visit MD Nyoka Cowden   Next appointment Nothing scheduled   Previous refill encounter(s)   02/23/2019:  - Prinzide #90 with 3 refills,   - Aspirin-DR #90 with 3 refills,   - Norvasc #90 with 3 refills     Requested Prescriptions     Pending Prescriptions Disp Refills   ??? lisinopril-hydroCHLOROthiazide (PRINZIDE, ZESTORETIC) 20-25 mg per tablet 90 Tablet 0     Sig: Take 1 Tablet by mouth daily.   ??? aspirin delayed-release 81 mg tablet 90 Tablet 3     Sig: Take 1 Tablet by mouth daily.   ??? amLODIPine (NORVASC) 5 mg tablet 90 Tablet 0     Sig: TAKE ONE TABLET BY MOUTH ONCE DAILY FOR BLOOD PRESSURE

## 2020-02-17 ENCOUNTER — Encounter

## 2020-02-19 MED ORDER — LISINOPRIL-HYDROCHLOROTHIAZIDE 20 MG-25 MG TAB
20-25 mg | ORAL_TABLET | ORAL | 0 refills | Status: DC
Start: 2020-02-19 — End: 2020-05-24

## 2020-02-19 MED ORDER — AMLODIPINE 5 MG TAB
5 mg | ORAL_TABLET | ORAL | 0 refills | Status: DC
Start: 2020-02-19 — End: 2020-05-24

## 2020-05-24 ENCOUNTER — Encounter

## 2020-05-24 MED ORDER — LISINOPRIL-HYDROCHLOROTHIAZIDE 20 MG-25 MG TAB
20-25 mg | ORAL_TABLET | ORAL | 0 refills | Status: DC
Start: 2020-05-24 — End: 2020-08-17

## 2020-05-24 MED ORDER — AMLODIPINE 5 MG TAB
5 mg | ORAL_TABLET | ORAL | 0 refills | Status: DC
Start: 2020-05-24 — End: 2020-08-17

## 2020-08-17 ENCOUNTER — Encounter

## 2020-08-18 MED ORDER — LISINOPRIL-HYDROCHLOROTHIAZIDE 20 MG-25 MG TAB
20-25 mg | ORAL_TABLET | ORAL | 0 refills | Status: DC
Start: 2020-08-18 — End: 2020-11-13

## 2020-08-18 MED ORDER — AMLODIPINE 5 MG TAB
5 mg | ORAL_TABLET | ORAL | 0 refills | Status: DC
Start: 2020-08-18 — End: 2020-11-13

## 2020-08-31 ENCOUNTER — Inpatient Hospital Stay: Primary: Internal Medicine

## 2020-10-09 ENCOUNTER — Encounter

## 2020-10-09 MED ORDER — OMEPRAZOLE 40 MG CAP, DELAYED RELEASE
40 mg | ORAL_CAPSULE | ORAL | 0 refills | Status: DC
Start: 2020-10-09 — End: 2020-10-10

## 2020-10-09 MED ORDER — LOVASTATIN 40 MG TAB
40 mg | ORAL_TABLET | ORAL | 0 refills | Status: DC
Start: 2020-10-09 — End: 2020-12-24

## 2020-10-10 ENCOUNTER — Encounter

## 2020-10-10 MED ORDER — OMEPRAZOLE 40 MG CAP, DELAYED RELEASE
40 mg | ORAL_CAPSULE | ORAL | 0 refills | Status: DC
Start: 2020-10-10 — End: 2021-03-13

## 2020-11-13 ENCOUNTER — Encounter

## 2020-11-13 MED ORDER — LISINOPRIL-HYDROCHLOROTHIAZIDE 20 MG-25 MG TAB
20-25 mg | ORAL_TABLET | ORAL | 0 refills | Status: DC
Start: 2020-11-13 — End: 2021-02-17

## 2020-11-13 MED ORDER — AMLODIPINE 5 MG TAB
5 mg | ORAL_TABLET | ORAL | 0 refills | Status: DC
Start: 2020-11-13 — End: 2021-02-17

## 2020-11-28 ENCOUNTER — Ambulatory Visit: Admit: 2020-11-28 | Discharge: 2020-11-28 | Payer: MEDICARE | Attending: Internal Medicine | Primary: Internal Medicine

## 2020-11-28 ENCOUNTER — Ambulatory Visit: Attending: Internal Medicine | Primary: Internal Medicine

## 2020-11-28 DIAGNOSIS — I10 Essential (primary) hypertension: Secondary | ICD-10-CM

## 2020-11-28 NOTE — Progress Notes (Signed)
1. Have you been to the ER, urgent care clinic since your last visit?  Hospitalized since your last visit?no    2. Have you seen or consulted any other health care providers outside of the [facility] since your last visit?  Include any pap smears or colon screening. No    Chief Complaint   Patient presents with   . Complete Physical     3 most recent PHQ Screens 11/28/2020   Little interest or pleasure in doing things Not at all   Feeling down, depressed, irritable, or hopeless Not at all   Total Score PHQ 2 0

## 2020-11-28 NOTE — Addendum Note (Signed)
Addendum  Note by Wallis Mart at 11/28/20 0945                Author: Wallis Mart  Service: --  Author Type: Technician       Filed: 11/28/20 0954  Encounter Date: 11/28/2020  Status: Signed          Editor: Wallis Mart (Technician)          Addended by: Wallis Mart on: 11/28/2020 09:54 AM    Modules accepted: Orders

## 2020-11-28 NOTE — Progress Notes (Signed)
Damon [name] is a 70 y.o. male and presents with Complete Physical and Annual Wellness Visit  .  Subjective:    Hypertension Review:  The patient has essential hypertension  Diet and Lifestyle: generally follows a  low sodium diet, exercises sporadically  Home BP Monitoring: is not measured at home.  Pertinent ROS: taking medications as instructed, no medication side effects noted, no TIA's, no chest pain on exertion, no dyspnea on exertion, no swelling of ankles.       Dyslipidemia Review:  Patient presents for evaluation of lipids.  Compliance with treatment thus far has been excellent.  A repeat fasting lipid profile was not done.  The patient does not use medications that may worsen dyslipidemias (corticosteroids, progestins, anabolic steroids, amiodarone, cyclosporine, olanzapine). The patient exercises some        BPH Review:  He has no burning on urination,dysuria or dribbling reported.He continues to report frequency on urination.    Peripheral vascular disease history is reported on the rt.leg.he is followed by vascular surgery    He has recurrent pains in the rt.heel    He has a history of prostate cancer  He states he has had radiation in the past    Rt.foot pains reported    Damon [name] is a 70 y.o. male and presents for annual Medicare Wellness Visit.    Problem List: Reviewed with patient and discussed risk factors.    Patient Active Problem List   Diagnosis Code   ??? Hypertension I10   ??? GERD (gastroesophageal reflux disease) K21.9   ??? Hyperlipidemia E78.5   ??? Abdominal aortic aneurysm without rupture ([place]) I71.4   ??? GI bleed K92.2       Current medical providers:  Patient Care Team:  Helayne Seminole., MD as PCP - General (Internal Medicine)  Nyoka Cowden Cheri Guppy., MD as PCP - [facility] Empaneled Provider    PSH: Reviewed with patient  Past Surgical History:   Procedure Laterality Date   ??? HX ANGIOPLASTY      iliac, prior to percutaneous Endovascular Aortic Repair (EVAR)   ??? HX ORTHOPAEDIC  04/2018     heel surgery   ??? VASCULAR SURGERY PROCEDURE UNLIST  06/18/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        SH: Reviewed with patient  Social History     Tobacco Use   ??? Smoking status: Former Smoker     Packs/day: 0.50     Years: 44.00     Pack years: 22.00     Types: Cigarettes     Start date: 12/11/1967   ??? Smokeless tobacco: Former Systems developer     Quit date: 05/22/2015   ??? Tobacco comment: Pt. quit smoking 04/2015   Substance Use Topics   ??? Alcohol use: No     Alcohol/week: 0.0 standard drinks     Comment: quit 05/22/2015   ??? Drug use: No       FH: Reviewed with patient  Family History   Problem Relation Age of Onset   ??? Hypertension Mother    ??? Hypertension Sister    ??? Kidney Disease Sister    ??? Hypertension Brother    ??? Heart Disease Brother    ??? Kidney Disease Sister         on dialysis   ??? Heart Disease Sister    ??? Hypertension Sister    ??? Diabetes Father    ??? Heart Disease Father    ??? Hypertension  Father    ??? Diabetes Paternal Uncle         deceased, 60's, heart trouble       Medications/Allergies: Reviewed with patient  Current Outpatient Medications on File Prior to Visit   Medication Sig Dispense Refill   ??? lisinopril-hydroCHLOROthiazide (PRINZIDE, ZESTORETIC) 20-25 mg per tablet Take 1 tablet by mouth once daily 90 Tablet 0   ??? amLODIPine (NORVASC) 5 mg tablet Take 1 tablet by mouth once daily for blood pressure 90 Tablet 0   ??? omeprazole (PRILOSEC) 40 mg capsule TAKE 1 CAPSULE BY MOUTH ONCE DAILY FOR  STOMACH 90 Capsule 0   ??? lovastatin (MEVACOR) 40 mg tablet TAKE 1 TABLET BY MOUTH ONCE DAILY AT  NIGHT 90 Tablet 0   ??? tamsulosin (FLOMAX) 0.4 mg capsule Take 1 Cap by mouth daily. 90 Cap 3   ??? aspirin delayed-release 81 mg tablet Take 1 Tab by mouth daily. 90 Tab 3     No current facility-administered medications on file prior to visit.      No Known Allergies    Objective:  Visit Vitals  BP 104/70   Pulse 73   Temp 98 ??F (36.7 ??C) (Oral)   Resp 16   Ht 5\' 9"  (1.753 m)   Wt 207 lb (93.9 kg)   SpO2 94%   BMI 30.57 kg/m??     Body mass index is 30.57 kg/m??.    Assessment of cognitive impairment: Alert and oriented x 3    Depression Screen:   3 most recent PHQ Screens 11/28/2020   Little interest or pleasure in doing things Not at all   Feeling down, depressed, irritable, or hopeless Not at all   Total Score PHQ 2 0     Depression Review:  Patient is seen for screen of depression,denies anhedonia, weight gain, insomnia, hypersomnia, psychomotor agitation, psychomotor retardation, fatigue, feelings of worthlessness/guilt, difficulty concentrating, hopelessness, impaired memory and recurrent thoughts of death Treatment includes no medication   The denies recurrent thoughts of death and suicidal thoughts without plan.    Fall Risk Assessment:    Fall Risk Assessment, last 12 mths 11/28/2020   Able to walk? Yes   Fall in past 12 months? 0   Do you feel unsteady? 0   Are you worried about falling 0   Number of falls in past 12 months -   Fall with injury? -       Functional Ability:   Does the patient exhibit a steady gait?  yes   How long did it take the patient to get up and walk from a sitting position? seconds   Is the patient self reliant?  (ie can do own laundry, meals, household chores)  yes     Does the patient handle his/her own medications?  yes     Does the patient handle his/her own money?   yes     Is the patient???s home safe (ie good lighting, handrails on stairs and bath, etc.)?   yes     Did you notice or did patient express any hearing difficulties?   no     Did you notice or did patient express any vision difficulties?   no     Were distance and reading eye charts used?  no       Advance Care Planning:   Patient was offered the opportunity to discuss advance care planning:  yes     Does patient have an Advance Directive:  no  If no, did you provide information on C[facility] Connections?  yes       Plan:      Orders Placed This Encounter   ??? HEMOGLOBIN A1C WITH EAG   ??? CBC W/O DIFF   ??? LIPID PANEL   ??? METABOLIC PANEL, COMPREHENSIVE    ??? PROSTATE SPECIFIC AG   ??? [facility]   ??? REFERRAL TO PODIATRY       Health Maintenance   Topic Date Due   ??? DTaP/Tdap/Td series (1 - Tdap) Never done   ??? Shingrix Vaccine Age 60> (1 of 2) Never done   ??? Lipid Screen  10/20/2019   ??? COVID-19 Vaccine (3 - Booster for Pfizer series) 05/17/2020   ??? Flu Vaccine (1) 05/22/2020   ??? Colorectal Cancer Screening Combo  09/09/2021   ??? Depression Screen  11/28/2021   ??? Medicare Yearly Exam  11/29/2021   ??? Hepatitis C Screening  Completed   ??? AAA Screening 83-71 YO Male Smoking Patients  Completed   ??? Pneumococcal 65+ years  Completed       *Patient verbalized understanding and agreement with the plan.  A copy of the After Visit Summary with personalized health plan was given to the patient today.      Review of Systems  Constitutional: negative for fevers, chills, anorexia and weight loss  Eyes:   negative for visual disturbance and irritation  ENT:   negative for tinnitus,sore throat,nasal congestion,ear pains.hoarseness  Respiratory:  negative for cough, hemoptysis, dyspnea,wheezing  CV:   negative for chest pain, palpitations, lower extremity edema  GI:   negative for nausea, vomiting, diarrhea, abdominal pain,melena  Endo:               negative for polyuria,polydipsia,polyphagia,heat intolerance  Genitourinary: negative for frequency, dysuria and hematuria  Integument:  negative for rash and pruritus  Hematologic:  negative for easy bruising and gum/nose bleeding  Musculoskel: negative for myalgias, arthralgias, back pain, muscle weakness, joint pain  Neurological:  negative for headaches, dizziness, vertigo, memory problems and gait   Behavl/Psych: negative for feelings of anxiety, depression, mood changes    Past Medical History:   Diagnosis Date   ??? Aneurysm (HCC) 2016    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   ??? 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 Cancer (HCC)      prostate   ??? GERD (gastroesophageal reflux disease)    ??? HTN (hypertension)    ??? Hyperlipidemia      Past Surgical History:   Procedure Laterality Date   ??? HX ANGIOPLASTY      iliac, prior to percutaneous Endovascular Aortic Repair (EVAR)   ??? HX ORTHOPAEDIC  04/2018    heel surgery   ??? VASCULAR SURGERY PROCEDURE UNLIST  06/18/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     Social History     Socioeconomic History   ??? Marital status: SINGLE   Tobacco Use   ??? Smoking status: Former Smoker     Packs/day: 0.50     Years: 44.00     Pack years: 22.00     Types: Cigarettes     Start date: 12/11/1967   ??? Smokeless tobacco: Former Neurosurgeon     Quit date: 05/22/2015   ??? Tobacco comment: Pt. quit smoking 04/2015   Substance and Sexual Activity   ??? Alcohol use: No     Alcohol/week:  0.0 standard drinks     Comment: quit 05/22/2015   ??? Drug use: No     Family History   Problem Relation Age of Onset   ??? Hypertension Mother    ??? Hypertension Sister    ??? Kidney Disease Sister    ??? Hypertension Brother    ??? Heart Disease Brother    ??? Kidney Disease Sister         on dialysis   ??? Heart Disease Sister    ??? Hypertension Sister    ??? Diabetes Father    ??? Heart Disease Father    ??? Hypertension Father    ??? Diabetes Paternal Uncle         deceased, 17's, heart trouble     Current Outpatient Medications   Medication Sig Dispense Refill   ??? lisinopril-hydroCHLOROthiazide (PRINZIDE, ZESTORETIC) 20-25 mg per tablet Take 1 tablet by mouth once daily 90 Tablet 0   ??? amLODIPine (NORVASC) 5 mg tablet Take 1 tablet by mouth once daily for blood pressure 90 Tablet 0   ??? omeprazole (PRILOSEC) 40 mg capsule TAKE 1 CAPSULE BY MOUTH ONCE DAILY FOR  STOMACH 90 Capsule 0   ??? lovastatin (MEVACOR) 40 mg tablet TAKE 1 TABLET BY MOUTH ONCE DAILY AT  NIGHT 90 Tablet 0   ??? tamsulosin (FLOMAX) 0.4 mg capsule Take 1 Cap by mouth daily. 90 Cap 3   ??? aspirin delayed-release 81 mg tablet Take 1 Tab by mouth daily. 90 Tab 3     No Known Allergies    Objective:  Visit Vitals  BP 104/70    Pulse 73   Temp 98 ??F (36.7 ??C) (Oral)   Resp 16   Ht 5\' 9"  (1.753 m)   Wt 207 lb (93.9 kg)   SpO2 94%   BMI 30.57 kg/m??     Physical Exam:   General appearance - alert, well appearing, and in no distress  Mental status - alert, oriented to person, place, and time  EYE-PERLA, EOMI, corneas normal, no foreign bodies  ENT-ENT exam normal, no neck nodes or sinus tenderness  Nose - normal and patent, no erythema, discharge or polyps  Mouth - mucous membranes moist, pharynx normal without lesions  Neck - supple, no significant adenopathy   Chest - clear to auscultation, no wheezes, rales or rhonchi, symmetric air entry   Heart - normal rate, regular rhythm, normal S1, S2, no murmurs, rubs, clicks or gallops   Abdomen - soft, nontender, nondistended, no masses or organomegaly  Lymph- no adenopathy palpable  Ext-peripheral pulses normal, no pedal edema, no clubbing or cyanosis  Skin-Warm and dry. no hyperpigmentation, vitiligo, or suspicious lesions  Neuro -alert, oriented, normal speech, no focal findings or movement disorder noted  Neck-normal C-spine, no tenderness, full ROM without pain  Feet-no nail deformities or callus formation with good pulses noted      Results for orders placed or performed in visit on 10/19/18   LIPID PANEL   Result Value Ref Range    Cholesterol, total 187 100 - 199 mg/dL    Triglyceride 10/21/18 0 - 149 mg/dL    HDL Cholesterol 40 060 mg/dL    VLDL, calculated 21 5 - 40 mg/dL    LDL, calculated >04 (H) 0 - 99 mg/dL   CVD REPORT   Result Value Ref Range    INTERPRETATION Note        Assessment/Plan:    ICD-10-CM ICD-9-CM    1. Essential hypertension with goal  blood pressure less than 130/80  I10 401.9 CBC W/O DIFF      METABOLIC PANEL, COMPREHENSIVE   2. Pure hypercholesterolemia  E78.00 272.0 LIPID PANEL   3. Gastrointestinal hemorrhage associated with other gastritis  K29.61 535.51    4. Prostate cancer (HCC)  C61 185 PSA, DIAGNOSTIC (PROSTATE SPECIFIC AG)   5. PVD (peripheral vascular disease)  (HCC)  I73.9 443.9 REFERRAL TO VASCULAR SURGERY   6. Hyperglycemia  R73.9 790.29 HEMOGLOBIN A1C WITH EAG   7. Right foot pain  M79.671 729.5 REFERRAL TO PODIATRY   8. Medicare annual wellness visit, subsequent  Z00.00 V70.0      Orders Placed This Encounter   ??? HEMOGLOBIN A1C WITH EAG     Standing Status:   Future     Standing Expiration Date:   11/28/2021   ??? CBC W/O DIFF     Standing Status:   Future     Standing Expiration Date:   11/28/2021   ??? LIPID PANEL     Standing Status:   Future     Standing Expiration Date:   1/61/0960   ??? METABOLIC PANEL, COMPREHENSIVE     Standing Status:   Future     Standing Expiration Date:   11/28/2021   ??? PROSTATE SPECIFIC AG     Standing Status:   Future     Standing Expiration Date:   11/28/2021   ??? [facility]     Referral Priority:   Routine     Referral Type:   Consultation     Referral Reason:   Specialty Services Required     Referred to Provider:   Alesia Morin., MD     Number of Visits Requested:   1   ??? REFERRAL TO PODIATRY     Referral Priority:   Routine     Referral Type:   Consultation     Referral Reason:   Specialty Services Required     Referral Location:   [facility]     Referred to Provider:   Ila Mcgill, DPM     Requested Specialty:   Podiatry     Number of Visits Requested:   1     lose weight, increase physical activity, follow low fat diet, follow low salt diet, have labs drawn prior to ROV, call if any problems  Patient Instructions   MyChart Activation    Thank you for requesting access to MyChart. Please follow the instructions below to securely access and download your online medical record. MyChart allows you to send messages to your doctor, view your test results, renew your prescriptions, schedule appointments, and more.    How Do I Sign Up?    1. In your internet browser, go to [email or web address]  2. Click on the First Time User? Click Here link in the Sign In box. You will be redirect  to the New Member Sign Up page.  3. Enter your MyChart Access Code exactly as it appears below. You will not need to use this code after you???ve completed the sign-up process. If you do not sign up before the expiration date, you must request a new code.    MyChart Access Code: [email or web address]  Expires: 01/12/2021  9:32 AM (This is the date your MyChart access code will expire)    4. Enter the last four digits of your Social Security Number (xxxx) and Date of Birth (mm/dd/yyyy) as indicated and click Submit. You  will be taken to the next sign-up page.  5. Create a MyChart ID. This will be your MyChart login ID and cannot be changed, so think of one that is secure and easy to remember.  6. Create a MyChart password. You can change your password at any time.  7. Enter your Password Reset Question and Answer. This can be used at a later time if you forget your password.   8. Enter your e-mail address. You will receive e-mail notification when new information is available in MyChart.  9. Click Sign Up. You can now view and download portions of your medical record.  10. Click the Download Summary menu link to download a portable copy of your medical information.    Additional Information    If you have questions, please visit the Frequently Asked Questions section of the MyChart website at [email or web address] Remember, MyChart is NOT to be used for urgent needs. For medical emergencies, dial 911.           Follow-up and Dispositions    ?? Return in about 3 months (around 02/28/2021), or if symptoms worsen or fail to improve.         I have reviewed with the patient details of the assessment and plan and all questions were answered. Relevent patient education was performed    An After Visit Summary was printed and given to the patient.

## 2020-11-29 LAB — CBC
Hematocrit: 44.8 % (ref 36.6–50.3)
Hemoglobin: 14.2 g/dL (ref 12.1–17.0)
MCH: 27.5 PG (ref 26.0–34.0)
MCHC: 31.7 g/dL (ref 30.0–36.5)
MCV: 86.7 FL (ref 80.0–99.0)
MPV: 10.3 FL (ref 8.9–12.9)
NRBC Absolute: 0 10*3/uL (ref 0.00–0.01)
Nucleated RBCs: 0 PER 100 WBC
Platelets: 361 10*3/uL (ref 150–400)
RBC: 5.17 M/uL (ref 4.10–5.70)
RDW: 14.6 % — ABNORMAL HIGH (ref 11.5–14.5)
WBC: 6.3 10*3/uL (ref 4.1–11.1)

## 2020-11-29 LAB — COMPREHENSIVE METABOLIC PANEL
ALT: 19 U/L (ref 12–78)
AST: 10 U/L — ABNORMAL LOW (ref 15–37)
Albumin/Globulin Ratio: 1 — ABNORMAL LOW (ref 1.1–2.2)
Albumin: 3.9 g/dL (ref 3.5–5.0)
Alkaline Phosphatase: 108 U/L (ref 45–117)
Anion Gap: 6 mmol/L (ref 5–15)
BUN: 28 MG/DL — ABNORMAL HIGH (ref 6–20)
Bun/Cre Ratio: 15 (ref 12–20)
CO2: 23 mmol/L (ref 21–32)
Calcium: 9.7 MG/DL (ref 8.5–10.1)
Chloride: 107 mmol/L (ref 97–108)
Creatinine: 1.83 MG/DL — ABNORMAL HIGH (ref 0.70–1.30)
EGFR IF NonAfrican American: 37 mL/min/{1.73_m2} — ABNORMAL LOW (ref >60)
GFR African American: 45 mL/min/{1.73_m2} — ABNORMAL LOW (ref >60)
Globulin: 3.9 g/dL (ref 2.0–4.0)
Glucose: 110 mg/dL — ABNORMAL HIGH (ref 65–100)
Potassium: 4.5 mmol/L (ref 3.5–5.1)
Sodium: 136 mmol/L (ref 136–145)
Total Bilirubin: 0.7 MG/DL (ref 0.2–1.0)
Total Protein: 7.8 g/dL (ref 6.4–8.2)

## 2020-11-29 LAB — LIPID PANEL
CHOL/HDL Ratio: 6.2 — ABNORMAL HIGH (ref 0.0–5.0)
Chol/HDL Ratio: 6.2 — ABNORMAL HIGH (ref 0.0–5.0)
Cholesterol, Total: 217 MG/DL — ABNORMAL HIGH (ref <200)
Cholesterol, total: 217 MG/DL — ABNORMAL HIGH (ref <200)
HDL Cholesterol: 35 MG/DL
HDL: 35 MG/DL
LDL Calculated: 152.4 MG/DL — ABNORMAL HIGH (ref 0–100)
LDL, calculated: 152.4 MG/DL — ABNORMAL HIGH (ref 0–100)
Triglyceride: 148 MG/DL (ref <150)
Triglycerides: 148 MG/DL (ref <150)
VLDL Cholesterol Calculated: 29.6 MG/DL
VLDL, calculated: 29.6 MG/DL

## 2020-11-29 LAB — HEMOGLOBIN A1C W/EAG
Hemoglobin A1C: 6.2 % — ABNORMAL HIGH (ref 4.0–5.6)
eAG: 131 mg/dL

## 2020-11-29 LAB — PSA PROSTATIC SPECIFIC ANTIGEN: Pros. Spec. Antigen: 0.6 ng/mL (ref 0.01–4.0)

## 2020-11-29 LAB — METABOLIC PANEL, COMPREHENSIVE
A-G Ratio: 1 — ABNORMAL LOW (ref 1.1–2.2)
ALT (SGPT): 19 U/L (ref 12–78)
AST (SGOT): 10 U/L — ABNORMAL LOW (ref 15–37)
Albumin: 3.9 g/dL (ref 3.5–5.0)
Alk. phosphatase: 108 U/L (ref 45–117)
Anion gap: 6 mmol/L (ref 5–15)
BUN/Creatinine ratio: 15 (ref 12–20)
BUN: 28 MG/DL — ABNORMAL HIGH (ref 6–20)
Bilirubin, total: 0.7 MG/DL (ref 0.2–1.0)
CO2: 23 mmol/L (ref 21–32)
Calcium: 9.7 MG/DL (ref 8.5–10.1)
Chloride: 107 mmol/L (ref 97–108)
Creatinine: 1.83 MG/DL — ABNORMAL HIGH (ref 0.70–1.30)
GFR est AA: 45 mL/min/{1.73_m2} — ABNORMAL LOW (ref >60)
GFR est non-AA: 37 mL/min/{1.73_m2} — ABNORMAL LOW (ref >60)
Globulin: 3.9 g/dL (ref 2.0–4.0)
Glucose: 110 mg/dL — ABNORMAL HIGH (ref 65–100)
Potassium: 4.5 mmol/L (ref 3.5–5.1)
Protein, total: 7.8 g/dL (ref 6.4–8.2)
Sodium: 136 mmol/L (ref 136–145)

## 2020-11-29 LAB — CBC W/O DIFF
ABSOLUTE NRBC: 0 10*3/uL (ref 0.00–0.01)
HCT: 44.8 % (ref 36.6–50.3)
HGB: 14.2 g/dL (ref 12.1–17.0)
MCH: 27.5 PG (ref 26.0–34.0)
MCHC: 31.7 g/dL (ref 30.0–36.5)
MCV: 86.7 FL (ref 80.0–99.0)
MPV: 10.3 FL (ref 8.9–12.9)
NRBC: 0 PER 100 WBC
PLATELET: 361 10*3/uL (ref 150–400)
RBC: 5.17 M/uL (ref 4.10–5.70)
RDW: 14.6 % — ABNORMAL HIGH (ref 11.5–14.5)
WBC: 6.3 10*3/uL (ref 4.1–11.1)

## 2020-11-29 LAB — HEMOGLOBIN A1C WITH EAG
Est. average glucose: 131 mg/dL
Hemoglobin A1c: 6.2 % — ABNORMAL HIGH (ref 4.0–5.6)

## 2020-11-29 LAB — PSA, DIAGNOSTIC (PROSTATE SPECIFIC AG): Prostate Specific Ag: 0.6 ng/mL (ref 0.01–4.0)

## 2020-12-24 ENCOUNTER — Encounter

## 2020-12-24 MED ORDER — TAMSULOSIN SR 0.4 MG 24 HR CAP
0.4 mg | ORAL_CAPSULE | ORAL | 0 refills | Status: DC
Start: 2020-12-24 — End: 2021-03-13

## 2020-12-24 MED ORDER — LOVASTATIN 40 MG TAB
40 mg | ORAL_TABLET | ORAL | 0 refills | Status: DC
Start: 2020-12-24 — End: 2021-03-13

## 2021-02-15 ENCOUNTER — Encounter

## 2021-02-17 MED ORDER — AMLODIPINE 5 MG TAB
5 mg | ORAL_TABLET | ORAL | 0 refills | Status: DC
Start: 2021-02-17 — End: 2021-05-16

## 2021-02-17 MED ORDER — LISINOPRIL-HYDROCHLOROTHIAZIDE 20 MG-25 MG TAB
20-25 mg | ORAL_TABLET | ORAL | 0 refills | Status: DC
Start: 2021-02-17 — End: 2021-05-16

## 2021-03-06 ENCOUNTER — Ambulatory Visit: Admit: 2021-03-06 | Discharge: 2021-03-06 | Payer: MEDICARE | Attending: Internal Medicine | Primary: Internal Medicine

## 2021-03-06 ENCOUNTER — Ambulatory Visit: Attending: Internal Medicine | Primary: Internal Medicine

## 2021-03-06 DIAGNOSIS — I10 Essential (primary) hypertension: Secondary | ICD-10-CM

## 2021-03-06 LAB — LIPID PANEL
CHOL/HDL Ratio: 4.9 (ref 0.0–5.0)
Chol/HDL Ratio: 4.9 (ref 0.0–5.0)
Cholesterol, Total: 206 MG/DL — ABNORMAL HIGH (ref <200)
Cholesterol, total: 206 MG/DL — ABNORMAL HIGH (ref <200)
HDL Cholesterol: 42 MG/DL
HDL: 42 MG/DL
LDL Calculated: 136.2 MG/DL — ABNORMAL HIGH (ref 0–100)
LDL, calculated: 136.2 MG/DL — ABNORMAL HIGH (ref 0–100)
Triglyceride: 139 MG/DL (ref <150)
Triglycerides: 139 MG/DL (ref <150)
VLDL Cholesterol Calculated: 27.8 MG/DL
VLDL, calculated: 27.8 MG/DL

## 2021-03-06 LAB — COMPREHENSIVE METABOLIC PANEL
ALT: 13 U/L (ref 12–78)
AST: 6 U/L — ABNORMAL LOW (ref 15–37)
Albumin/Globulin Ratio: 0.7 — ABNORMAL LOW (ref 1.1–2.2)
Albumin: 3.4 g/dL — ABNORMAL LOW (ref 3.5–5.0)
Alkaline Phosphatase: 109 U/L (ref 45–117)
Anion Gap: 9 mmol/L (ref 5–15)
BUN: 25 MG/DL — ABNORMAL HIGH (ref 6–20)
Bun/Cre Ratio: 18 (ref 12–20)
CO2: 24 mmol/L (ref 21–32)
Calcium: 10.3 MG/DL — ABNORMAL HIGH (ref 8.5–10.1)
Chloride: 105 mmol/L (ref 97–108)
Creatinine: 1.42 MG/DL — ABNORMAL HIGH (ref 0.70–1.30)
EGFR IF NonAfrican American: 49 mL/min/{1.73_m2} — ABNORMAL LOW (ref >60)
GFR African American: 60 mL/min/{1.73_m2} — ABNORMAL LOW (ref >60)
Globulin: 4.8 g/dL — ABNORMAL HIGH (ref 2.0–4.0)
Glucose: 106 mg/dL — ABNORMAL HIGH (ref 65–100)
Potassium: 4.6 mmol/L (ref 3.5–5.1)
Sodium: 138 mmol/L (ref 136–145)
Total Bilirubin: 0.5 MG/DL (ref 0.2–1.0)
Total Protein: 8.2 g/dL (ref 6.4–8.2)

## 2021-03-06 LAB — CBC
Hematocrit: 39.4 % (ref 36.6–50.3)
Hemoglobin: 12.3 g/dL (ref 12.1–17.0)
MCH: 26.2 PG (ref 26.0–34.0)
MCHC: 31.2 g/dL (ref 30.0–36.5)
MCV: 84 FL (ref 80.0–99.0)
MPV: 9.3 FL (ref 8.9–12.9)
NRBC Absolute: 0 10*3/uL (ref 0.00–0.01)
Nucleated RBCs: 0 PER 100 WBC
Platelets: 506 10*3/uL — ABNORMAL HIGH (ref 150–400)
RBC: 4.69 M/uL (ref 4.10–5.70)
RDW: 14.8 % — ABNORMAL HIGH (ref 11.5–14.5)
WBC: 8.4 10*3/uL (ref 4.1–11.1)

## 2021-03-06 LAB — METABOLIC PANEL, COMPREHENSIVE
A-G Ratio: 0.7 — ABNORMAL LOW (ref 1.1–2.2)
ALT (SGPT): 13 U/L (ref 12–78)
AST (SGOT): 6 U/L — ABNORMAL LOW (ref 15–37)
Albumin: 3.4 g/dL — ABNORMAL LOW (ref 3.5–5.0)
Alk. phosphatase: 109 U/L (ref 45–117)
Anion gap: 9 mmol/L (ref 5–15)
BUN/Creatinine ratio: 18 (ref 12–20)
BUN: 25 MG/DL — ABNORMAL HIGH (ref 6–20)
Bilirubin, total: 0.5 MG/DL (ref 0.2–1.0)
CO2: 24 mmol/L (ref 21–32)
Calcium: 10.3 MG/DL — ABNORMAL HIGH (ref 8.5–10.1)
Chloride: 105 mmol/L (ref 97–108)
Creatinine: 1.42 MG/DL — ABNORMAL HIGH (ref 0.70–1.30)
GFR est AA: 60 mL/min/{1.73_m2} — ABNORMAL LOW (ref >60)
GFR est non-AA: 49 mL/min/{1.73_m2} — ABNORMAL LOW (ref >60)
Globulin: 4.8 g/dL — ABNORMAL HIGH (ref 2.0–4.0)
Glucose: 106 mg/dL — ABNORMAL HIGH (ref 65–100)
Potassium: 4.6 mmol/L (ref 3.5–5.1)
Protein, total: 8.2 g/dL (ref 6.4–8.2)
Sodium: 138 mmol/L (ref 136–145)

## 2021-03-06 LAB — CBC W/O DIFF
ABSOLUTE NRBC: 0 10*3/uL (ref 0.00–0.01)
HCT: 39.4 % (ref 36.6–50.3)
HGB: 12.3 g/dL (ref 12.1–17.0)
MCH: 26.2 PG (ref 26.0–34.0)
MCHC: 31.2 g/dL (ref 30.0–36.5)
MCV: 84 FL (ref 80.0–99.0)
MPV: 9.3 FL (ref 8.9–12.9)
NRBC: 0 PER 100 WBC
PLATELET: 506 10*3/uL — ABNORMAL HIGH (ref 150–400)
RBC: 4.69 M/uL (ref 4.10–5.70)
RDW: 14.8 % — ABNORMAL HIGH (ref 11.5–14.5)
WBC: 8.4 10*3/uL (ref 4.1–11.1)

## 2021-03-06 NOTE — Progress Notes (Signed)
 1. Have you been to the ER, urgent care clinic since your last visit?  Hospitalized since your last visit?no    2. Have you seen or consulted any other health care providers outside of the [facility] since your last visit?  Include any pap smears or colon screening. No    Chief Complaint   Patient presents with   . Cholesterol Problem   . Hypertension   . GERD     3 most recent PHQ Screens 03/06/2021   Little interest or pleasure in doing things Not at all   Feeling down, depressed, irritable, or hopeless Not at all   Total Score PHQ 2 0

## 2021-03-06 NOTE — Progress Notes (Signed)
Damon [name] is a 70 y.o. male and presents with Cholesterol Problem, Hypertension, and GERD  .  Subjective:  Hypertension Review:  The patient has hypertension .  Diet and Lifestyle: generally follows a low sodium diet, exercises sporadically  Home BP Monitoring: is not measured at home.  Pertinent ROS: taking medications as instructed, no medication side effects noted, no TIA's, no chest pain on exertion, no dyspnea on exertion, no swelling of ankles.    Dyslipidemia Review:  Patient presents for evaluation of lipids.  Compliance with treatment thus far has been excellent.  A repeat fasting lipid profile was done.  The patient does not use medications that may worsen dyslipidemias . The patient exercises sporadically.      GERD Review:   Patient has a history of gastroesophageal reflux with heartburn. Symptoms have been present for a few months.  He denies dysphagia.  He  has not lost weight.  He denies melena, hematochezia, hematemesis, and coffee ground emesis.  This has been associated with fullness after meals.  He denies abdominal bloating and none.  Medical therapy in the past has included proton pump inhibitor      He has a history of prostate cancer  His has PSA was less than 0.6  HE STATES HE IS TO FOLLOW UP WITH UROLOGY      Review of Systems  Constitutional: negative for fevers, chills, anorexia and weight loss  Eyes:   negative for visual disturbance and irritation  ENT:   negative for tinnitus,sore throat,nasal congestion,ear pains.hoarseness  Respiratory:  negative for cough, hemoptysis, dyspnea,wheezing  CV:   negative for chest pain, palpitations, lower extremity edema  GI:   negative for nausea, vomiting, diarrhea, abdominal pain,melena  Endo:               negative for polyuria,polydipsia,polyphagia,heat intolerance  Genitourinary: negative for frequency, dysuria and hematuria  Integument:  negative for rash and pruritus  Hematologic:  negative for easy bruising and gum/nose  bleeding  Musculoskel: negative for myalgias, arthralgias, back pain, muscle weakness, joint pain  Neurological:  negative for headaches, dizziness, vertigo, memory problems and gait   Behavl/Psych: negative for feelings of anxiety, depression, mood changes    Past Medical History:   Diagnosis Date   ??? 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 Cancer ([facility])     prostate   ??? GERD (gastroesophageal reflux disease)    ??? HTN (hypertension)    ??? Hyperlipidemia      Past Surgical History:   Procedure Laterality Date   ??? HX ANGIOPLASTY      iliac, prior to percutaneous Endovascular Aortic Repair (EVAR)   ??? HX ORTHOPAEDIC  04/2018    heel surgery   ??? VASCULAR SURGERY PROCEDURE UNLIST  06/18/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     Social History     Socioeconomic History   ??? Marital status: SINGLE   Tobacco Use   ??? Smoking status: Former Smoker     Packs/day: 0.50     Years: 44.00     Pack years: 22.00     Types: Cigarettes     Start date: 12/11/1967   ??? Smokeless tobacco: Former Systems developer     Quit date: 05/22/2015   ??? Tobacco comment: Pt. quit smoking 04/2015   Substance and Sexual Activity   ???  Alcohol use: No     Alcohol/week: 0.0 standard drinks     Comment: quit 05/22/2015   ??? Drug use: No     Family History   Problem Relation Age of Onset   ??? Hypertension Mother    ??? Hypertension Sister    ??? Kidney Disease Sister    ??? Hypertension Brother    ??? Heart Disease Brother    ??? Kidney Disease Sister         on dialysis   ??? Heart Disease Sister    ??? Hypertension Sister    ??? Diabetes Father    ??? Heart Disease Father    ??? Hypertension Father    ??? Diabetes Paternal Uncle         deceased, 50's, heart trouble     Current Outpatient Medications   Medication Sig Dispense Refill   ??? amLODIPine (NORVASC) 5 mg tablet Take 1 tablet by mouth once daily for blood pressure 90 Tablet 0   ??? lisinopril-hydroCHLOROthiazide  (PRINZIDE, ZESTORETIC) 20-25 mg per tablet Take 1 tablet by mouth once daily 90 Tablet 0   ??? tamsulosin (FLOMAX) 0.4 mg capsule Take 1 capsule by mouth once daily 90 Capsule 0   ??? lovastatin (MEVACOR) 40 mg tablet TAKE 1 TABLET BY MOUTH ONCE DAILY AT NIGHT 90 Tablet 0   ??? omeprazole (PRILOSEC) 40 mg capsule TAKE 1 CAPSULE BY MOUTH ONCE DAILY FOR  STOMACH 90 Capsule 0   ??? aspirin delayed-release 81 mg tablet Take 1 Tab by mouth daily. 90 Tab 3     No Known Allergies    Objective:  Visit Vitals  BP 116/70   Pulse 70   Temp 97.2 ??F (36.2 ??C) (Oral)   Resp 16   Ht _0  (1.753 m)   Wt 193 lb (87.5 kg)   SpO2 97%   BMI 28.50 kg/m??     Physical Exam:   General appearance - alert, well appearing, and in no distress  Mental status - alert, oriented to person, place, and time  EYE-PERLA, EOMI, corneas normal, no foreign bodies  ENT-ENT exam normal, no neck nodes or sinus tenderness  Nose - normal and patent, no erythema, discharge or polyps  Mouth - mucous membranes moist, pharynx normal without lesions  Neck - supple, no significant adenopathy   Chest - clear to auscultation, no wheezes, rales or rhonchi, symmetric air entry   Heart - normal rate, regular rhythm, normal S1, S2, no murmurs, rubs, clicks or gallops   Abdomen - soft, nontender, nondistended, no masses or organomegaly  Lymph- no adenopathy palpable  Ext-peripheral pulses normal, no pedal edema, no clubbing or cyanosis  Skin-Warm and dry. no hyperpigmentation, vitiligo, or suspicious lesions  Neuro -alert, oriented, normal speech, no focal findings or movement disorder noted  Neck-normal C-spine, no tenderness, full ROM without pain  Feet-no nail deformities or callus formation with good pulses noted      Results for orders placed or performed in visit on 11/28/20   LIPID PANEL   Result Value Ref Range    Cholesterol, total 217 (H) <200 MG/DL    Triglyceride 148 <150 MG/DL    HDL Cholesterol 35 MG/DL    LDL, calculated 152.4 (H) 0 - 100 MG/DL    VLDL, calculated  29.6 MG/DL    CHOL/HDL Ratio 6.2 (H) 0.0 - 5.0     METABOLIC PANEL, COMPREHENSIVE   Result Value Ref Range    Sodium 136 136 - 145 mmol/L  Potassium 4.5 3.5 - 5.1 mmol/L    Chloride 107 97 - 108 mmol/L    CO2 23 21 - 32 mmol/L    Anion gap 6 5 - 15 mmol/L    Glucose 110 (H) 65 - 100 mg/dL    BUN 28 (H) 6 - 20 MG/DL    Creatinine 1.83 (H) 0.70 - 1.30 MG/DL    BUN/Creatinine ratio 15 12 - 20      GFR est AA 45 (L) >60 ml/min/1.67m    GFR est non-AA 37 (L) >60 ml/min/1.719m   Calcium 9.7 8.5 - 10.1 MG/DL    Bilirubin, total 0.7 0.2 - 1.0 MG/DL    ALT (SGPT) 19 12 - 78 U/L    AST (SGOT) 10 (L) 15 - 37 U/L    Alk. phosphatase 108 45 - 117 U/L    Protein, total 7.8 6.4 - 8.2 g/dL    Albumin 3.9 3.5 - 5.0 g/dL    Globulin 3.9 2.0 - 4.0 g/dL    A-G Ratio 1.0 (L) 1.1 - 2.2     PSA, DIAGNOSTIC (PROSTATE SPECIFIC AG)   Result Value Ref Range    Prostate Specific Ag 0.6 0.01 - 4.0 ng/mL   CBC W/O DIFF   Result Value Ref Range    WBC 6.3 4.1 - 11.1 K/uL    RBC 5.17 4.10 - 5.70 M/uL    HGB 14.2 12.1 - 17.0 g/dL    HCT 44.8 36.6 - 50.3 %    MCV 86.7 80.0 - 99.0 FL    MCH 27.5 26.0 - 34.0 PG    MCHC 31.7 30.0 - 36.5 g/dL    RDW 14.6 (H) 11.5 - 14.5 %    PLATELET 361 150 - 400 K/uL    MPV 10.3 8.9 - 12.9 FL    NRBC 0.0 0 PER 100 WBC    ABSOLUTE NRBC 0.00 0.00 - 0.01 K/uL   HEMOGLOBIN A1C WITH EAG   Result Value Ref Range    Hemoglobin A1c 6.2 (H) 4.0 - 5.6 %    Est. average glucose 131 mg/dL       Assessment/Plan:    ICD-10-CM ICD-9-CM    1. Primary hypertension  I10 401.9 CBC W/O DIFF      METABOLIC PANEL, COMPREHENSIVE   2. Pure hypercholesterolemia  E78.00 272.0 LIPID PANEL   3. Prostate cancer ([place] C61 185      Orders Placed This Encounter   ??? CBC W/O DIFF     Standing Status:   Future     Standing Expiration Date:   03/06/2022   ??? LIPID PANEL     Standing Status:   Future     Standing Expiration Date:   02/25/41/5956 ??? METABOLIC PANEL, COMPREHENSIVE     Standing Status:   Future     Standing Expiration Date:   03/06/2022      lose weight, increase physical activity, follow low fat diet, follow low salt diet, call if any problems,Take '81mg'$  aspirin daily  Patient Instructions   MyChart Activation    Thank you for requesting access to MyChart. Please follow the instructions below to securely access and download your online medical record. MyChart allows you to send messages to your doctor, view your test results, renew your prescriptions, schedule appointments, and more.    How Do I Sign Up?    1. In your internet browser, go to [email or web address]  2. Click on the First Time User? Click Here  link in the Sign In box. You will be redirect to the New Member Sign Up page.  3. Enter your MyChart Access Code exactly as it appears below. You will not need to use this code after you???ve completed the sign-up process. If you do not sign up before the expiration date, you must request a new code.    MyChart Access Code: [email or web address]  Expires: 04/20/2021  9:48 AM (This is the date your MyChart access code will expire)    4. Enter the last four digits of your Social Security Number (xxxx) and Date of Birth (mm/dd/yyyy) as indicated and click Submit. You will be taken to the next sign-up page.  5. Create a MyChart ID. This will be your MyChart login ID and cannot be changed, so think of one that is secure and easy to remember.  6. Create a MyChart password. You can change your password at any time.  7. Enter your Password Reset Question and Answer. This can be used at a later time if you forget your password.   8. Enter your e-mail address. You will receive e-mail notification when new information is available in [place].  9. Click Sign Up. You can now view and download portions of your medical record.  10. Click the Download Summary menu link to download a portable copy of your medical information.    Additional Information    If you have questions, please visit the Frequently Asked Questions section of the MyChart website at  [email or web address] Remember, MyChart is NOT to be used for urgent needs. For medical emergencies, dial 911.           Follow-up and Dispositions    ?? Return in about 3 months (around 06/06/2021), or if symptoms worsen or fail to improve.           I have reviewed with the patient details of the assessment and plan and all questions were answered. Relevent patient education was performed.The most recent lab findings were reviewed with the patient.    An After Visit Summary was printed and given to the patient.

## 2021-03-06 NOTE — Addendum Note (Signed)
Addendum  Note by Kevan Rosebush at 03/06/21 0900                Author: Kevan Rosebush  Service: --  Author Type: Technician       Filed: 03/06/21 0952  Encounter Date: 03/06/2021  Status: Signed          Editor: Kevan Rosebush (Technician)          Addended by: Kevan Rosebush on: 03/06/2021 09:52 AM    Modules accepted: Orders

## 2021-03-13 ENCOUNTER — Encounter

## 2021-03-13 MED ORDER — LOVASTATIN 40 MG TAB
40 mg | ORAL_TABLET | ORAL | 0 refills | Status: DC
Start: 2021-03-13 — End: 2021-06-24

## 2021-03-13 MED ORDER — TAMSULOSIN SR 0.4 MG 24 HR CAP
0.4 mg | ORAL_CAPSULE | ORAL | 0 refills | Status: DC
Start: 2021-03-13 — End: 2021-06-24

## 2021-03-13 MED ORDER — OMEPRAZOLE 40 MG CAP, DELAYED RELEASE
40 mg | ORAL_CAPSULE | ORAL | 0 refills | Status: DC
Start: 2021-03-13 — End: 2021-06-24

## 2021-05-16 ENCOUNTER — Encounter

## 2021-05-16 MED ORDER — LISINOPRIL-HYDROCHLOROTHIAZIDE 20 MG-25 MG TAB
20-25 mg | ORAL_TABLET | ORAL | 0 refills | Status: DC
Start: 2021-05-16 — End: 2021-08-17

## 2021-05-16 MED ORDER — AMLODIPINE 5 MG TAB
5 mg | ORAL_TABLET | ORAL | 0 refills | Status: DC
Start: 2021-05-16 — End: 2021-08-17

## 2021-06-12 ENCOUNTER — Encounter: Attending: Internal Medicine | Primary: Internal Medicine

## 2021-06-23 ENCOUNTER — Encounter

## 2021-06-24 MED ORDER — TAMSULOSIN SR 0.4 MG 24 HR CAP
0.4 mg | ORAL_CAPSULE | ORAL | 0 refills | Status: DC
Start: 2021-06-24 — End: 2021-09-19

## 2021-06-24 MED ORDER — OMEPRAZOLE 40 MG CAP, DELAYED RELEASE
40 mg | ORAL_CAPSULE | ORAL | 0 refills | Status: DC
Start: 2021-06-24 — End: 2021-09-19

## 2021-06-24 MED ORDER — LOVASTATIN 40 MG TAB
40 mg | ORAL_TABLET | ORAL | 0 refills | Status: DC
Start: 2021-06-24 — End: 2021-09-19

## 2021-07-10 ENCOUNTER — Encounter: Payer: MEDICARE | Attending: Internal Medicine | Primary: Internal Medicine

## 2021-07-24 ENCOUNTER — Encounter: Attending: Internal Medicine | Primary: Internal Medicine

## 2021-07-24 NOTE — Addendum Note (Signed)
Addendum  Note by Wallis Mart at 07/24/21 1415                Author: Wallis Mart  Service: --  Author Type: Technician       Filed: 07/24/21 1444  Encounter Date: 07/24/2021  Status: Signed          Editor: Wallis Mart (Technician)          Addended by: Wallis Mart on: 07/24/2021 02:44 PM    Modules accepted: Orders

## 2021-07-24 NOTE — Progress Notes (Signed)
Chief Complaint   Patient presents with    Cholesterol Problem    Hypertension    Medication Refill     1. Have you been to the ER, urgent care clinic since your last visit?  Hospitalized since your last visit?No    2. Have you seen or consulted any other health care providers outside of the [facility] since your last visit?  Include any pap smears or colon screening. No

## 2021-07-24 NOTE — Progress Notes (Signed)
Damon [name] is a 70 y.o. male and presents with Cholesterol Problem, Hypertension, and Medication Refill  .  Subjective:  Hypertension Review:  The patient has hypertension .  Diet and Lifestyle: generally follows a low sodium diet, exercises sporadically  Home BP Monitoring: is not measured at home.  Pertinent ROS: taking medications as instructed, no medication side effects noted, no TIA's, no chest pain on exertion, no dyspnea on exertion, no swelling of ankles.    Dyslipidemia Review:  Patient presents for evaluation of lipids.  Compliance with treatment thus far has been excellent.  A repeat fasting lipid profile was done.  The patient does not use medications that may worsen dyslipidemias . The patient exercises sporadically.      GERD Review:   Patient has a history of gastroesophageal reflux with heartburn. Symptoms have been present for a few months.  He denies dysphagia.  He  has not lost weight.  He denies melena, hematochezia, hematemesis, and coffee ground emesis.  This has been associated with fullness after meals.  He denies abdominal bloating and none.  Medical therapy in the past has included proton pump inhibitor      He has a longstanding history of prostate cancer and offers no new complaints today.  His most recent PSA is unknown.       Review of Systems  Constitutional: negative for fevers, chills, anorexia and weight loss  Eyes:   negative for visual disturbance and irritation  ENT:   negative for tinnitus,sore throat,nasal congestion,ear pains.hoarseness  Respiratory:  negative for cough, hemoptysis, dyspnea,wheezing  CV:   negative for chest pain, palpitations, lower extremity edema  GI:   negative for nausea, vomiting, diarrhea, abdominal pain,melena  Endo:               negative for polyuria,polydipsia,polyphagia,heat intolerance  Genitourinary: negative for frequency, dysuria and hematuria  Integument:  negative for rash and pruritus  Hematologic:  negative for easy bruising and  gum/nose bleeding  Musculoskel: negative for myalgias, arthralgias, back pain, muscle weakness, joint pain  Neurological:  negative for headaches, dizziness, vertigo, memory problems and gait   Behavl/Psych: negative for feelings of anxiety, depression, mood changes    Past Medical History:   Diagnosis Date    Aneurysm ([place]) 2016    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    Aneurysm ([place]) 2016    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     Cancer ([place])     prostate    GERD (gastroesophageal reflux disease)     HTN (hypertension)     Hyperlipidemia      Past Surgical History:   Procedure Laterality Date    HX ANGIOPLASTY      iliac, prior to percutaneous Endovascular Aortic Repair (EVAR)    HX ORTHOPAEDIC  04/2018    heel surgery    VASCULAR SURGERY PROCEDURE UNLIST  06/18/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     Social History     Socioeconomic History    Marital status: SINGLE   Tobacco Use    Smoking status: Former     Packs/day: 0.50     Years: 44.00     Pack years: 22.00     Types: Cigarettes     Start date: 12/11/1967    Smokeless tobacco: Former     Quit date: 05/22/2015    Tobacco comments:     Pt. quit smoking 04/2015   Substance and Sexual Activity  Alcohol use: No     Alcohol/week: 0.0 standard drinks     Comment: quit 05/22/2015    Drug use: No     Family History   Problem Relation Age of Onset    Hypertension Mother     Hypertension Sister     Kidney Disease Sister     Hypertension Brother     Heart Disease Brother     Kidney Disease Sister         on dialysis    Heart Disease Sister     Hypertension Sister     Diabetes Father     Heart Disease Father     Hypertension Father     Diabetes Paternal Uncle         deceased, 45's, heart trouble     Current Outpatient Medications   Medication Sig Dispense Refill    Besivance 0.6 % drps ophthalmic suspension INSTILL 1 DROP INTO RIGHT EYE THREE TIMES DAILY AS DIRECTED      Prolensa 0.07 % ophthalmic solution INSTILL 1 DROP  INTO RIGHT EYE IN THE EVENING AS DIRECTED      lovastatin (MEVACOR) 40 mg tablet TAKE 1 TABLET BY MOUTH ONCE DAILY AT NIGHT 90 Tablet 0    omeprazole (PRILOSEC) 40 mg capsule TAKE 1 CAPSULE BY MOUTH ONCE DAILY FOR STOMACH 90 Capsule 0    tamsulosin (FLOMAX) 0.4 mg capsule Take 1 capsule by mouth once daily 90 Capsule 0    lisinopril-hydroCHLOROthiazide (PRINZIDE, ZESTORETIC) 20-25 mg per tablet Take 1 tablet by mouth once daily 90 Tablet 0    amLODIPine (NORVASC) 5 mg tablet Take 1 tablet by mouth once daily for blood pressure 90 Tablet 0    aspirin delayed-release 81 mg tablet Take 1 Tab by mouth daily. 90 Tab 3     No Known Allergies    Objective:  Visit Vitals  BP 122/70   Pulse 98   Temp (!) 96.3 ??F (35.7 ??C) (Oral)   Resp 17   Ht _0  (1.753 m)   Wt 196 lb 3.2 oz (89 kg)   SpO2 98%   BMI 28.97 kg/m??     Physical Exam:   General appearance - alert, well appearing, and in no distress  Mental status - alert, oriented to person, place, and time  EYE-PERLA, EOMI, corneas normal, no foreign bodies  ENT-ENT exam normal, no neck nodes or sinus tenderness  Nose - normal and patent, no erythema, discharge or polyps  Mouth - mucous membranes moist, pharynx normal without lesions  Neck - supple, no significant adenopathy   Chest - clear to auscultation, no wheezes, rales or rhonchi, symmetric air entry   Heart - normal rate, regular rhythm, normal S1, S2, no murmurs, rubs, clicks or gallops   Abdomen - soft, nontender, nondistended, no masses or organomegaly  Lymph- no adenopathy palpable  Ext-peripheral pulses normal, no pedal edema, no clubbing or cyanosis  Skin-Warm and dry. no hyperpigmentation, vitiligo, or suspicious lesions  Neuro -alert, oriented, normal speech, no focal findings or movement disorder noted  Neck-normal C-spine, no tenderness, full ROM without pain  Feet-no nail deformities or callus formation with good pulses noted      Results for orders placed or performed in visit on 84/13/24   METABOLIC PANEL,  COMPREHENSIVE   Result Value Ref Range    Sodium 138 136 - 145 mmol/L    Potassium 4.6 3.5 - 5.1 mmol/L    Chloride 105 97 - 108 mmol/L  CO2 24 21 - 32 mmol/L    Anion gap 9 5 - 15 mmol/L    Glucose 106 (H) 65 - 100 mg/dL    BUN 25 (H) 6 - 20 MG/DL    Creatinine 1.42 (H) 0.70 - 1.30 MG/DL    BUN/Creatinine ratio 18 12 - 20      GFR est AA 60 (L) >60 ml/min/1.14m    GFR est non-AA 49 (L) >60 ml/min/1.774m   Calcium 10.3 (H) 8.5 - 10.1 MG/DL    Bilirubin, total 0.5 0.2 - 1.0 MG/DL    ALT (SGPT) 13 12 - 78 U/L    AST (SGOT) 6 (L) 15 - 37 U/L    Alk. phosphatase 109 45 - 117 U/L    Protein, total 8.2 6.4 - 8.2 g/dL    Albumin 3.4 (L) 3.5 - 5.0 g/dL    Globulin 4.8 (H) 2.0 - 4.0 g/dL    A-G Ratio 0.7 (L) 1.1 - 2.2     LIPID PANEL   Result Value Ref Range    Cholesterol, total 206 (H) <200 MG/DL    Triglyceride 139 <150 MG/DL    HDL Cholesterol 42 MG/DL    LDL, calculated 136.2 (H) 0 - 100 MG/DL    VLDL, calculated 27.8 MG/DL    CHOL/HDL Ratio 4.9 0.0 - 5.0     CBC W/O DIFF   Result Value Ref Range    WBC 8.4 4.1 - 11.1 K/uL    RBC 4.69 4.10 - 5.70 M/uL    HGB 12.3 12.1 - 17.0 g/dL    HCT 39.4 36.6 - 50.3 %    MCV 84.0 80.0 - 99.0 FL    MCH 26.2 26.0 - 34.0 PG    MCHC 31.2 30.0 - 36.5 g/dL    RDW 14.8 (H) 11.5 - 14.5 %    PLATELET 506 (H) 150 - 400 K/uL    MPV 9.3 8.9 - 12.9 FL    NRBC 0.0 0 PER 100 WBC    ABSOLUTE NRBC 0.00 0.00 - 0.01 K/uL       Assessment/Plan:    ICD-10-CM ICD-9-CM    1. Pure hypercholesterolemia  E78.00 272.0 LIPID PANEL      2. Essential hypertension with goal blood pressure less than 130/80  I10 401.9 CBC W/O DIFF      METABOLIC PANEL, COMPREHENSIVE      3. Prostate cancer ([facility] C61 185           Orders Placed This Encounter    CBC W/O DIFF     Standing Status:   Future     Standing Expiration Date:   1152/03/7823  METABOLIC PANEL, COMPREHENSIVE     Standing Status:   Future     Standing Expiration Date:   07/24/2022    LIPID PANEL     Standing Status:   Future     Standing Expiration Date:    07/24/2022    Besivance 0.6 % drps ophthalmic suspension     Sig: INSTILL 1 DROP INTO RIGHT EYE THREE TIMES DAILY AS DIRECTED    Prolensa 0.07 % ophthalmic solution     Sig: INSTILL 1 DROP INTO RIGHT EYE IN THE EVENING AS DIRECTED     lose weight, increase physical activity, follow low fat diet, follow low salt diet, call if any problems,Take 8142mspirin daily  Patient Instructions   MyChart Activation    Thank you for requesting access to MyChart. Please follow the  instructions below to securely access and download your online medical record. MyChart allows you to send messages to your doctor, view your test results, renew your prescriptions, schedule appointments, and more.    How Do I Sign Up?    In your internet browser, go to [email or web address]  Click on the First Time User? Click Here link in the Sign In box. You will be redirect to the New Member Sign Up page.  Enter your MyChart Access Code exactly as it appears below. You will not need to use this code after you???ve completed the sign-up process. If you do not sign up before the expiration date, you must request a new code.    MyChart Access Code: [email or web address]  Expires: 09/07/2021  2:33 PM (This is the date your MyChart access code will expire)    Enter the last four digits of your Social Security Number (xxxx) and Date of Birth (mm/dd/yyyy) as indicated and click Submit. You will be taken to the next sign-up page.  Create a MyChart ID. This will be your MyChart login ID and cannot be changed, so think of one that is secure and easy to remember.  Create a Scientist, research (physical sciences). You can change your password at any time.  Enter your Password Reset Question and Answer. This can be used at a later time if you forget your password.   Enter your e-mail address. You will receive e-mail notification when new information is available in [place].  Click Sign Up. You can now view and download portions of your medical record.  Click the [employer] link to  download a portable copy of your medical information.    Additional Information    If you have questions, please visit the Frequently Asked Questions section of the MyChart website at [email or web address] Remember, MyChart is NOT to be used for urgent needs. For medical emergencies, dial 911.     Follow-up and Dispositions    Return in about 3 months (around 10/24/2021), or if symptoms worsen or fail to improve.             I have reviewed with the patient details of the assessment and plan and all questions were answered. Relevent patient education was performed.The most recent lab findings were reviewed with the patient.    An After Visit Summary was printed and given to the patient.

## 2021-07-25 LAB — COMPREHENSIVE METABOLIC PANEL
ALT: 17 U/L (ref 12–78)
AST: 11 U/L — ABNORMAL LOW (ref 15–37)
Albumin/Globulin Ratio: 0.8 — ABNORMAL LOW (ref 1.1–2.2)
Albumin: 3.7 g/dL (ref 3.5–5.0)
Alkaline Phosphatase: 132 U/L — ABNORMAL HIGH (ref 45–117)
Anion Gap: 8 mmol/L (ref 5–15)
BUN: 22 MG/DL — ABNORMAL HIGH (ref 6–20)
Bun/Cre Ratio: 15 (ref 12–20)
CO2: 23 mmol/L (ref 21–32)
Calcium: 9.5 MG/DL (ref 8.5–10.1)
Chloride: 106 mmol/L (ref 97–108)
Creatinine: 1.45 MG/DL — ABNORMAL HIGH (ref 0.70–1.30)
ESTIMATED GLOMERULAR FILTRATION RATE: 52 mL/min/{1.73_m2} — ABNORMAL LOW (ref >60)
Globulin: 4.5 g/dL — ABNORMAL HIGH (ref 2.0–4.0)
Glucose: 141 mg/dL — ABNORMAL HIGH (ref 65–100)
Potassium: 4 mmol/L (ref 3.5–5.1)
Sodium: 137 mmol/L (ref 136–145)
Total Bilirubin: 0.6 MG/DL (ref 0.2–1.0)
Total Protein: 8.2 g/dL (ref 6.4–8.2)

## 2021-07-25 LAB — LIPID PANEL
CHOL/HDL Ratio: 5.3 — ABNORMAL HIGH (ref 0.0–5.0)
Chol/HDL Ratio: 5.3 — ABNORMAL HIGH (ref 0.0–5.0)
Cholesterol, Total: 185 MG/DL (ref <200)
Cholesterol, total: 185 MG/DL (ref <200)
HDL Cholesterol: 35 MG/DL
HDL: 35 MG/DL
LDL Calculated: 120.6 MG/DL — ABNORMAL HIGH (ref 0–100)
LDL, calculated: 120.6 MG/DL — ABNORMAL HIGH (ref 0–100)
Triglyceride: 147 MG/DL (ref <150)
Triglycerides: 147 MG/DL (ref <150)
VLDL Cholesterol Calculated: 29.4 MG/DL
VLDL, calculated: 29.4 MG/DL

## 2021-07-25 LAB — CBC
Hematocrit: 41.1 % (ref 36.6–50.3)
Hemoglobin: 12.9 g/dL (ref 12.1–17.0)
MCH: 26 PG (ref 26.0–34.0)
MCHC: 31.4 g/dL (ref 30.0–36.5)
MCV: 82.7 FL (ref 80.0–99.0)
MPV: 10.2 FL (ref 8.9–12.9)
NRBC Absolute: 0 10*3/uL (ref 0.00–0.01)
Nucleated RBCs: 0 PER 100 WBC
Platelets: 389 10*3/uL (ref 150–400)
RBC: 4.97 M/uL (ref 4.10–5.70)
RDW: 15.6 % — ABNORMAL HIGH (ref 11.5–14.5)
WBC: 6.3 10*3/uL (ref 4.1–11.1)

## 2021-07-25 LAB — METABOLIC PANEL, COMPREHENSIVE
A-G Ratio: 0.8 — ABNORMAL LOW (ref 1.1–2.2)
ALT (SGPT): 17 U/L (ref 12–78)
AST (SGOT): 11 U/L — ABNORMAL LOW (ref 15–37)
Albumin: 3.7 g/dL (ref 3.5–5.0)
Alk. phosphatase: 132 U/L — ABNORMAL HIGH (ref 45–117)
Anion gap: 8 mmol/L (ref 5–15)
BUN/Creatinine ratio: 15 (ref 12–20)
BUN: 22 MG/DL — ABNORMAL HIGH (ref 6–20)
Bilirubin, total: 0.6 MG/DL (ref 0.2–1.0)
CO2: 23 mmol/L (ref 21–32)
Calcium: 9.5 MG/DL (ref 8.5–10.1)
Chloride: 106 mmol/L (ref 97–108)
Creatinine: 1.45 MG/DL — ABNORMAL HIGH (ref 0.70–1.30)
Globulin: 4.5 g/dL — ABNORMAL HIGH (ref 2.0–4.0)
Glucose: 141 mg/dL — ABNORMAL HIGH (ref 65–100)
Potassium: 4 mmol/L (ref 3.5–5.1)
Protein, total: 8.2 g/dL (ref 6.4–8.2)
Sodium: 137 mmol/L (ref 136–145)
eGFR: 52 mL/min/{1.73_m2} — ABNORMAL LOW (ref >60)

## 2021-07-25 LAB — CBC W/O DIFF
ABSOLUTE NRBC: 0 10*3/uL (ref 0.00–0.01)
HCT: 41.1 % (ref 36.6–50.3)
HGB: 12.9 g/dL (ref 12.1–17.0)
MCH: 26 PG (ref 26.0–34.0)
MCHC: 31.4 g/dL (ref 30.0–36.5)
MCV: 82.7 FL (ref 80.0–99.0)
MPV: 10.2 FL (ref 8.9–12.9)
NRBC: 0 PER 100 WBC
PLATELET: 389 10*3/uL (ref 150–400)
RBC: 4.97 M/uL (ref 4.10–5.70)
RDW: 15.6 % — ABNORMAL HIGH (ref 11.5–14.5)
WBC: 6.3 10*3/uL (ref 4.1–11.1)

## 2021-08-15 ENCOUNTER — Encounter

## 2021-08-17 MED ORDER — LISINOPRIL-HYDROCHLOROTHIAZIDE 20 MG-25 MG TAB
20-25 mg | ORAL_TABLET | ORAL | 0 refills | Status: AC
Start: 2021-08-17 — End: 2021-11-17

## 2021-08-17 MED ORDER — AMLODIPINE 5 MG TAB
5 mg | ORAL_TABLET | ORAL | 0 refills | Status: AC
Start: 2021-08-17 — End: 2021-11-17

## 2021-08-18 ENCOUNTER — Other Ambulatory Visit: Payer: Self-pay

## 2021-08-18 ENCOUNTER — Emergency Department
Admission: EM | Admit: 2021-08-18 | Discharge: 2021-08-18 | Disposition: A | Discharge status: Home / Self Care | Payer: Medicare Other | Source: Home / Self Care

## 2021-08-18 DIAGNOSIS — J3489 Other specified disorders of nose and nasal sinuses: Secondary | ICD-10-CM

## 2021-08-18 DIAGNOSIS — J0100 Acute maxillary sinusitis, unspecified: Secondary | ICD-10-CM

## 2021-08-18 MED ORDER — ACETAMINOPHEN 325 MG PO TABS
650.0000 mg | ORAL_TABLET | Freq: Once | ORAL | Status: AC
  Administered 2021-08-18: 16:00:00 650 mg via ORAL

## 2021-08-18 MED ORDER — CEFDINIR 300 MG PO CAPS: 300.0000 mg | ORAL_CAPSULE | Freq: Two times a day (BID) | ORAL | 0 refills | Status: AC

## 2021-08-18 MED ORDER — PREDNISONE 20 MG PO TABS: ORAL_TABLET | ORAL | 0 refills | Status: AC

## 2021-08-18 NOTE — Discharge Instructions (Addendum)
Advised patient to take medication as directed with food to completion.  Advised patient may take Prednisone burst with first dose of Cefdinir for 5 of next 7 days.  Encouraged patient to increase daily water intake while taking these medications.

## 2021-08-18 NOTE — ED Provider Notes (Signed)
[facility]    CSN: [id number] Arrival date & time: 08/18/21  1434      History   Chief Complaint Chief Complaint  Patient presents with   Headache    HPI Jose Fernandez is a 70 y.o. male.   HPI 70 year old male presents with headache intermittently for 2 weeks.  Has worsened today.  Patient reports taking OTC sinus medication for his pain with little to no relief.  PMH significant for HTN and abdominal aortic aneurysm without rupture.  Past Medical History:  Diagnosis Date   Abdominal aortic aneurysm without rupture 06/18/2015   GERD (gastroesophageal reflux disease) 11/04/2011   GI bleed 06/25/2015   Hyperlipidemia 05/04/2018   Hypertension 11/04/2011    Patient Active Problem List   Diagnosis Date Noted   Hyperlipidemia 05/04/2018   GI bleed 06/25/2015   Abdominal aortic aneurysm without rupture 06/18/2015   GERD (gastroesophageal reflux disease) 11/04/2011   Hypertension 11/04/2011    Past Surgical History:  Procedure Laterality Date   iliac, prior to percutaneous Endovascular Aortic Repair (EVAR)           Home Medications    Prior to Admission medications   Medication Sig Start Date End Date Taking? Authorizing Provider  cefdinir (OMNICEF) 300 MG capsule Take 1 capsule (300 mg total) by mouth 2 (two) times daily for 7 days. 08/18/21 08/25/21 Yes Trevor Iha, FNP  predniSONE (DELTASONE) 20 MG tablet Take 3 tabs PO daily x 5 days. 08/18/21  Yes Trevor Iha, FNP  amLODipine (NORVASC) 5 MG tablet Take 5 mg by mouth daily.    [provider]  aspirin EC 81 MG tablet Take 81 mg by mouth daily.    [provider]  lisinopril-hydrochlorothiazide (PRINZIDE,ZESTORETIC) 20-25 MG tablet Take 1 tablet by mouth daily.    [provider]  lovastatin (MEVACOR) 40 MG tablet Take 40 mg by mouth at bedtime.    [provider]  omeprazole (PRILOSEC) 40 MG capsule Take 40 mg by mouth daily.    [provider]   tamsulosin (FLOMAX) 0.4 MG CAPS capsule Take 0.4 mg by mouth.    [provider]    Family History Family History  Problem Relation Age of Onset   Hypertension Mother    Hypertension Father    Diabetes Father    Heart disease Sister    Hypertension Sister    Kidney disease Sister    Hypertension Brother    Hyperlipidemia Brother    Diabetes Paternal Uncle     Social History Social History   Tobacco Use   Smoking status: Former    Packs/day: 0.50    Years: 44.00    Pack years: 22.00    Types: Cigarettes    Quit date: 05/22/2015    Years since quitting: 6.2   Smokeless tobacco: Never  Vaping Use   Vaping Use: Never used  Substance Use Topics   Alcohol use: Never   Drug use: Never     Allergies   Patient has no known allergies.   Review of Systems Review of Systems  Neurological:  Positive for headaches.  All other systems reviewed and are negative.   Physical Exam Triage Vital Signs ED Triage Vitals  Enc Vitals Group     BP 08/18/21 1527 (!) 148/69     Pulse Rate 08/18/21 1527 77     Resp 08/18/21 1527 16     Temp 08/18/21 1527 98.7 F (37.1 C)     Temp Source  08/18/21 1527 Oral     SpO2 08/18/21 1527 98 %     Weight --      Height --      Head Circumference --      Peak Flow --      Pain Score 08/18/21 1528 6     Pain Loc --      Pain Edu? --      Excl. in GC? --    No data found.  Updated Vital Signs BP (!) 148/69 (BP Location: Right Arm)   Pulse 77   Temp 98.7 F (37.1 C) (Oral)   Resp 16   SpO2 98%    Physical Exam Vitals and nursing note reviewed.  Constitutional:      Appearance: Normal appearance. He is normal weight.  HENT:     Head: Normocephalic and atraumatic.     Right Ear: Tympanic membrane, ear canal and external ear normal.     Left Ear: Tympanic membrane, ear canal and external ear normal.     Mouth/Throat:     Mouth: Mucous membranes are moist.     Pharynx: Oropharynx is clear.  Eyes:     Extraocular  Movements: Extraocular movements intact.     Conjunctiva/sclera: Conjunctivae normal.     Pupils: Pupils are equal, round, and reactive to light.  Cardiovascular:     Rate and Rhythm: Normal rate and regular rhythm.     Pulses: Normal pulses.     Heart sounds: Normal heart sounds.  Pulmonary:     Effort: Pulmonary effort is normal.     Breath sounds: Normal breath sounds.  Musculoskeletal:        General: Normal range of motion.     Cervical back: Normal range of motion and neck supple.  Skin:    General: Skin is warm and dry.  Neurological:     General: No focal deficit present.     Mental Status: He is alert and oriented to person, place, and time. Mental status is at baseline.     UC Treatments / Results  Labs (all labs ordered are listed, but only abnormal results are displayed) Labs Reviewed - No data to display  EKG   Radiology No results found.  Procedures Procedures (including critical care time)  Medications Ordered in UC Medications  acetaminophen (TYLENOL) tablet 650 mg (650 mg Oral Given 08/18/21 1531)    Initial Impression / Assessment and Plan / UC Course  I have reviewed the triage vital signs and the nursing notes.  Pertinent labs & imaging results that were available during my care of the patient were reviewed by me and considered in my medical decision making (see chart for details).     MDM: 1.  Acute maxillary sinusitis, recurrence not specified-Rx'd Cefdinir; 2.  Sinus pressure-Rx Prednisone burst. Advised patient to take medication as directed with food to completion.  Advised patient may take Prednisone burst with first dose of Cefdinir for 5 of next 7 days.  Encouraged patient to increase daily water intake while taking these medications. Final Clinical Impressions(s) / UC Diagnoses   Final diagnoses:  Acute maxillary sinusitis, recurrence not specified  Sinus pressure     Discharge Instructions      Advised patient to take medication  as directed with food to completion.  Advised patient may take Prednisone burst with first dose of Cefdinir for 5 of next 7 days.  Encouraged patient to increase daily water intake while taking these medications.  ED Prescriptions     Medication Sig Dispense Auth. Provider   cefdinir (OMNICEF) 300 MG capsule Take 1 capsule (300 mg total) by mouth 2 (two) times daily for 7 days. 14 capsule Trevor Iha, FNP   predniSONE (DELTASONE) 20 MG tablet Take 3 tabs PO daily x 5 days. 15 tablet Trevor Iha, FNP      PDMP not reviewed this encounter.   Trevor Iha, FNP 08/18/21 1737

## 2021-08-18 NOTE — ED Triage Notes (Signed)
Pt presents with headache that's been intermittent x2 weeks but has worsened today. Pt states he took otc sinus medication for his pain.

## 2021-09-18 ENCOUNTER — Encounter

## 2021-09-19 MED ORDER — TAMSULOSIN SR 0.4 MG 24 HR CAP
0.4 mg | ORAL_CAPSULE | ORAL | 0 refills | Status: DC
Start: 2021-09-19 — End: 2021-12-17

## 2021-09-19 MED ORDER — LOVASTATIN 40 MG TAB
40 mg | ORAL_TABLET | ORAL | 0 refills | Status: AC
Start: 2021-09-19 — End: 2021-10-24

## 2021-09-19 MED ORDER — OMEPRAZOLE 40 MG CAP, DELAYED RELEASE
40 mg | ORAL_CAPSULE | ORAL | 0 refills | Status: AC
Start: 2021-09-19 — End: 2021-12-17

## 2021-10-16 ENCOUNTER — Inpatient Hospital Stay: Admit: 2021-10-16 | Payer: MEDICARE | Primary: Internal Medicine

## 2021-10-24 ENCOUNTER — Ambulatory Visit: Admit: 2021-10-24 | Discharge: 2021-10-24 | Payer: MEDICARE | Attending: Internal Medicine | Primary: Internal Medicine

## 2021-10-24 ENCOUNTER — Ambulatory Visit: Attending: Internal Medicine | Primary: Internal Medicine

## 2021-10-24 DIAGNOSIS — E7800 Pure hypercholesterolemia, unspecified: Secondary | ICD-10-CM

## 2021-10-24 MED ORDER — ROSUVASTATIN 20 MG TAB
20 mg | ORAL_TABLET | Freq: Every evening | ORAL | 3 refills | Status: AC
Start: 2021-10-24 — End: ?

## 2021-10-24 NOTE — Progress Notes (Signed)
1. Have you been to the ER, urgent care clinic since your last visit?  Hospitalized since your last visit?no    2. Have you seen or consulted any other health care providers outside of the [place] since your last visit?  Include any pap smears or colon screening. No    Chief Complaint   Patient presents with    Cholesterol Problem    Hypertension    GERD

## 2021-10-24 NOTE — Progress Notes (Signed)
Damon [name] is a 71 y.o. male and presents with Cholesterol Problem, Hypertension, and GERD  .  Subjective:  Hypertension Review:  The patient has hypertension .  Diet and Lifestyle: generally follows a low sodium diet, exercises sporadically  Home BP Monitoring: is not measured at home.  Pertinent ROS: taking medications as instructed, no medication side effects noted, no TIA's, no chest pain on exertion, no dyspnea on exertion, no swelling of ankles.    Dyslipidemia Review:  Patient presents for evaluation of lipids.  Compliance with treatment thus far has been excellent.  A repeat fasting lipid profile was done.  The patient does not use medications that may worsen dyslipidemias . The patient exercises sporadically.      GERD Review:   Patient has a history of gastroesophageal reflux with heartburn. Symptoms have been present for a few months.  He denies dysphagia.  He  has not lost weight.  He denies melena, hematochezia, hematemesis, and coffee ground emesis.  This has been associated with fullness after meals.  He denies abdominal bloating and none.  Medical therapy in the past has included proton pump inhibitor      He has a longstanding history of prostate cancer and offers no new complaints today.  His most recent PSA is good.       Review of Systems  Constitutional: negative for fevers, chills, anorexia and weight loss  Eyes:   negative for visual disturbance and irritation  ENT:   negative for tinnitus,sore throat,nasal congestion,ear pains.hoarseness  Respiratory:  negative for cough, hemoptysis, dyspnea,wheezing  CV:   negative for chest pain, palpitations, lower extremity edema  GI:   negative for nausea, vomiting, diarrhea, abdominal pain,melena  Endo:               negative for polyuria,polydipsia,polyphagia,heat intolerance  Genitourinary: negative for frequency, dysuria and hematuria  Integument:  negative for rash and pruritus  Hematologic:  negative for easy bruising and gum/nose  bleeding  Musculoskel: negative for myalgias, arthralgias, back pain, muscle weakness, joint pain  Neurological:  negative for headaches, dizziness, vertigo, memory problems and gait   Behavl/Psych: negative for feelings of anxiety, depression, mood changes    Past Medical History:   Diagnosis Date    Aneurysm ([place]) 2016    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    Aneurysm ([place]) 2016    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     Cancer ([place])     prostate    GERD (gastroesophageal reflux disease)     HTN (hypertension)     Hyperlipidemia      Past Surgical History:   Procedure Laterality Date    HX ANGIOPLASTY      iliac, prior to percutaneous Endovascular Aortic Repair (EVAR)    HX ORTHOPAEDIC  04/2018    heel surgery    PR UNLISTED PROCEDURE VASCULAR SURGERY  06/18/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     Social History     Socioeconomic History    Marital status: SINGLE   Tobacco Use    Smoking status: Former     Packs/day: 0.50     Years: 44.00     Pack years: 22.00     Types: Cigarettes     Start date: 12/11/1967    Smokeless tobacco: Former     Quit date: 05/22/2015    Tobacco comments:     Pt. quit smoking 04/2015   Substance and Sexual Activity  Alcohol use: No     Alcohol/week: 0.0 standard drinks     Comment: quit 05/22/2015    Drug use: No     Family History   Problem Relation Age of Onset    Hypertension Mother     Hypertension Sister     Kidney Disease Sister     Hypertension Brother     Heart Disease Brother     Kidney Disease Sister         on dialysis    Heart Disease Sister     Hypertension Sister     Diabetes Father     Heart Disease Father     Hypertension Father     Diabetes Paternal Uncle         deceased, 68's, heart trouble     Current Outpatient Medications   Medication Sig Dispense Refill    rosuvastatin (CRESTOR) 20 mg tablet Take 1 Tablet by mouth nightly. 90 Tablet 3    omeprazole (PRILOSEC) 40 mg capsule TAKE 1 CAPSULE BY MOUTH ONCE DAILY FOR STOMACH  90 Capsule 0    tamsulosin (FLOMAX) 0.4 mg capsule Take 1 capsule by mouth once daily 90 Capsule 0    lisinopril-hydroCHLOROthiazide (PRINZIDE, ZESTORETIC) 20-25 mg per tablet Take 1 tablet by mouth once daily 90 Tablet 0    amLODIPine (NORVASC) 5 mg tablet Take 1 tablet by mouth once daily for blood pressure 90 Tablet 0    Besivance 0.6 % drps ophthalmic suspension INSTILL 1 DROP INTO RIGHT EYE THREE TIMES DAILY AS DIRECTED      Prolensa 0.07 % ophthalmic solution INSTILL 1 DROP INTO RIGHT EYE IN THE EVENING AS DIRECTED      aspirin delayed-release 81 mg tablet Take 1 Tab by mouth daily. 90 Tab 3     No Known Allergies    Objective:  Visit Vitals  BP 110/70   Pulse 74   Temp 98 ??F (36.7 ??C) (Oral)   Resp 16   Ht '5\' 9"'$  (4.034 m)   Wt 204 lb (92.5 kg)   SpO2 98%   BMI 30.13 kg/m??     Physical Exam:   General appearance - alert, well appearing, and in no distress  Mental status - alert, oriented to person, place, and time  EYE-PERLA, EOMI, corneas normal, no foreign bodies  ENT-ENT exam normal, no neck nodes or sinus tenderness  Nose - normal and patent, no erythema, discharge or polyps  Mouth - mucous membranes moist, pharynx normal without lesions  Neck - supple, no significant adenopathy   Chest - clear to auscultation, no wheezes, rales or rhonchi, symmetric air entry   Heart - normal rate, regular rhythm, normal S1, S2, no murmurs, rubs, clicks or gallops   Abdomen - soft, nontender, nondistended, no masses or organomegaly  Lymph- no adenopathy palpable  Ext-peripheral pulses normal, no pedal edema, no clubbing or cyanosis  Skin-Warm and dry. no hyperpigmentation, vitiligo, or suspicious lesions  Neuro -alert, oriented, normal speech, no focal findings or movement disorder noted  Neck-normal C-spine, no tenderness, full ROM without pain  Feet-no nail deformities or callus formation with good pulses noted      Results for orders placed or performed in visit on 74/25/95   METABOLIC PANEL, COMPREHENSIVE   Result  Value Ref Range    Sodium 137 136 - 145 mmol/L    Potassium 4.0 3.5 - 5.1 mmol/L    Chloride 106 97 - 108 mmol/L    CO2 23 21 - 32  mmol/L    Anion gap 8 5 - 15 mmol/L    Glucose 141 (H) 65 - 100 mg/dL    BUN 22 (H) 6 - 20 MG/DL    Creatinine 1.45 (H) 0.70 - 1.30 MG/DL    BUN/Creatinine ratio 15 12 - 20      eGFR 52 (L) >60 ml/min/1.37m    Calcium 9.5 8.5 - 10.1 MG/DL    Bilirubin, total 0.6 0.2 - 1.0 MG/DL    ALT (SGPT) 17 12 - 78 U/L    AST (SGOT) 11 (L) 15 - 37 U/L    Alk. phosphatase 132 (H) 45 - 117 U/L    Protein, total 8.2 6.4 - 8.2 g/dL    Albumin 3.7 3.5 - 5.0 g/dL    Globulin 4.5 (H) 2.0 - 4.0 g/dL    A-G Ratio 0.8 (L) 1.1 - 2.2     LIPID PANEL   Result Value Ref Range    Cholesterol, total 185 <200 MG/DL    Triglyceride 147 <150 MG/DL    HDL Cholesterol 35 MG/DL    LDL, calculated 120.6 (H) 0 - 100 MG/DL    VLDL, calculated 29.4 MG/DL    CHOL/HDL Ratio 5.3 (H) 0.0 - 5.0     CBC W/O DIFF   Result Value Ref Range    WBC 6.3 4.1 - 11.1 K/uL    RBC 4.97 4.10 - 5.70 M/uL    HGB 12.9 12.1 - 17.0 g/dL    HCT 41.1 36.6 - 50.3 %    MCV 82.7 80.0 - 99.0 FL    MCH 26.0 26.0 - 34.0 PG    MCHC 31.4 30.0 - 36.5 g/dL    RDW 15.6 (H) 11.5 - 14.5 %    PLATELET 389 150 - 400 K/uL    MPV 10.2 8.9 - 12.9 FL    NRBC 0.0 0 PER 100 WBC    ABSOLUTE NRBC 0.00 0.00 - 0.01 K/uL       Assessment/Plan:    ICD-10-CM ICD-9-CM    1. Pure hypercholesterolemia  E78.00 272.0 LIPID PANEL      2. Prostate cancer ([place]  C61 185       3. Primary hypertension  IG314517.6METABOLIC PANEL, COMPREHENSIVE      CBC W/O DIFF      4. Gastroesophageal reflux disease without esophagitis  K21.9 530.81             Orders Placed This Encounter    METABOLIC PANEL, COMPREHENSIVE     Standing Status:   Future     Standing Expiration Date:   10/24/2022    CBC W/O DIFF     Standing Status:   Future     Standing Expiration Date:   10/24/2022    LIPID PANEL     Standing Status:   Future     Standing Expiration Date:   10/24/2022    rosuvastatin (CRESTOR) 20 mg tablet      Sig: Take 1 Tablet by mouth nightly.     Dispense:  90 Tablet     Refill:  3       lose weight, increase physical activity, follow low fat diet, follow low salt diet, call if any problems,Take 814maspirin daily  Patient Instructions   MyChart Activation    Thank you for requesting access to MyChart. Please follow the instructions below to securely access and download your online medical record. MyChart allows you to send messages to your doctor, view your  test results, renew your prescriptions, schedule appointments, and more.    How Do I Sign Up?    In your internet browser, go to [email or web address]  Click on the First Time User? Click Here link in the Sign In box. You will be redirect to the New Member Sign Up page.  Enter your MyChart Access Code exactly as it appears below. You will not need to use this code after you???ve completed the sign-up process. If you do not sign up before the expiration date, you must request a new code.    MyChart Access Code: [email or web address]  Expires: 12/08/2021 10:02 AM (This is the date your MyChart access code will expire)    Enter the last four digits of your Social Security Number (xxxx) and Date of Birth (mm/dd/yyyy) as indicated and click Submit. You will be taken to the next sign-up page.  Create a MyChart ID. This will be your MyChart login ID and cannot be changed, so think of one that is secure and easy to remember.  Create a Scientist, research (physical sciences). You can change your password at any time.  Enter your Password Reset Question and Answer. This can be used at a later time if you forget your password.   Enter your e-mail address. You will receive e-mail notification when new information is available in [place].  Click Sign Up. You can now view and download portions of your medical record.  Click the [employer] link to download a portable copy of your medical information.    Additional Information    If you have questions, please visit the Frequently Asked Questions  section of the MyChart website at [email or web address] Remember, MyChart is NOT to be used for urgent needs. For medical emergencies, dial 911.     Follow-up and Dispositions    Return in about 3 months (around 01/21/2022), or if symptoms worsen or fail to improve.               I have reviewed with the patient details of the assessment and plan and all questions were answered. Relevent patient education was performed.The most recent lab findings were reviewed with the patient.    An After Visit Summary was printed and given to the patient.

## 2021-10-24 NOTE — Addendum Note (Signed)
Addendum  Note by Wallis Mart at 10/24/21 0930                Author: Wallis Mart  Service: --  Author Type: Technician       Filed: 10/24/21 1011  Encounter Date: 10/24/2021  Status: Signed          Editor: Wallis Mart (Technician)          Addended by: Wallis Mart on: 10/24/2021 10:11 AM    Modules accepted: Orders

## 2021-10-25 LAB — COMPREHENSIVE METABOLIC PANEL
ALT: 17 U/L (ref 12–78)
AST: 12 U/L — ABNORMAL LOW (ref 15–37)
Albumin/Globulin Ratio: 1 — ABNORMAL LOW (ref 1.1–2.2)
Albumin: 3.8 g/dL (ref 3.5–5.0)
Alkaline Phosphatase: 133 U/L — ABNORMAL HIGH (ref 45–117)
Anion Gap: 8 mmol/L (ref 5–15)
BUN: 20 MG/DL (ref 6–20)
Bun/Cre Ratio: 14 (ref 12–20)
CO2: 24 mmol/L (ref 21–32)
Calcium: 9.6 MG/DL (ref 8.5–10.1)
Chloride: 104 mmol/L (ref 97–108)
Creatinine: 1.41 MG/DL — ABNORMAL HIGH (ref 0.70–1.30)
ESTIMATED GLOMERULAR FILTRATION RATE: 53 mL/min/{1.73_m2} — ABNORMAL LOW (ref >60)
Globulin: 4 g/dL (ref 2.0–4.0)
Glucose: 109 mg/dL — ABNORMAL HIGH (ref 65–100)
Potassium: 4.7 mmol/L (ref 3.5–5.1)
Sodium: 136 mmol/L (ref 136–145)
Total Bilirubin: 0.7 MG/DL (ref 0.2–1.0)
Total Protein: 7.8 g/dL (ref 6.4–8.2)

## 2021-10-25 LAB — CBC
Hematocrit: 45.6 % (ref 36.6–50.3)
Hemoglobin: 14 g/dL (ref 12.1–17.0)
MCH: 25.7 PG — ABNORMAL LOW (ref 26.0–34.0)
MCHC: 30.7 g/dL (ref 30.0–36.5)
MCV: 83.8 FL (ref 80.0–99.0)
MPV: 9.5 FL (ref 8.9–12.9)
NRBC Absolute: 0 10*3/uL (ref 0.00–0.01)
Nucleated RBCs: 0 PER 100 WBC
Platelets: 427 10*3/uL — ABNORMAL HIGH (ref 150–400)
RBC: 5.44 M/uL (ref 4.10–5.70)
RDW: 15.5 % — ABNORMAL HIGH (ref 11.5–14.5)
WBC: 5.1 10*3/uL (ref 4.1–11.1)

## 2021-10-25 LAB — LIPID PANEL
CHOL/HDL Ratio: 5.6 — ABNORMAL HIGH (ref 0.0–5.0)
Chol/HDL Ratio: 5.6 — ABNORMAL HIGH (ref 0.0–5.0)
Cholesterol, Total: 200 MG/DL — ABNORMAL HIGH (ref <200)
Cholesterol, total: 200 MG/DL — ABNORMAL HIGH (ref <200)
HDL Cholesterol: 36 MG/DL
HDL: 36 MG/DL
LDL Calculated: 132.2 MG/DL — ABNORMAL HIGH (ref 0–100)
LDL, calculated: 132.2 MG/DL — ABNORMAL HIGH (ref 0–100)
Triglyceride: 159 MG/DL — ABNORMAL HIGH (ref <150)
Triglycerides: 159 MG/DL — ABNORMAL HIGH (ref <150)
VLDL Cholesterol Calculated: 31.8 MG/DL
VLDL, calculated: 31.8 MG/DL

## 2021-10-25 LAB — CBC W/O DIFF
ABSOLUTE NRBC: 0 10*3/uL (ref 0.00–0.01)
HCT: 45.6 % (ref 36.6–50.3)
HGB: 14 g/dL (ref 12.1–17.0)
MCH: 25.7 PG — ABNORMAL LOW (ref 26.0–34.0)
MCHC: 30.7 g/dL (ref 30.0–36.5)
MCV: 83.8 FL (ref 80.0–99.0)
MPV: 9.5 FL (ref 8.9–12.9)
NRBC: 0 PER 100 WBC
PLATELET: 427 10*3/uL — ABNORMAL HIGH (ref 150–400)
RBC: 5.44 M/uL (ref 4.10–5.70)
RDW: 15.5 % — ABNORMAL HIGH (ref 11.5–14.5)
WBC: 5.1 10*3/uL (ref 4.1–11.1)

## 2021-10-25 LAB — METABOLIC PANEL, COMPREHENSIVE
A-G Ratio: 1 — ABNORMAL LOW (ref 1.1–2.2)
ALT (SGPT): 17 U/L (ref 12–78)
AST (SGOT): 12 U/L — ABNORMAL LOW (ref 15–37)
Albumin: 3.8 g/dL (ref 3.5–5.0)
Alk. phosphatase: 133 U/L — ABNORMAL HIGH (ref 45–117)
Anion gap: 8 mmol/L (ref 5–15)
BUN/Creatinine ratio: 14 (ref 12–20)
BUN: 20 MG/DL (ref 6–20)
Bilirubin, total: 0.7 MG/DL (ref 0.2–1.0)
CO2: 24 mmol/L (ref 21–32)
Calcium: 9.6 MG/DL (ref 8.5–10.1)
Chloride: 104 mmol/L (ref 97–108)
Creatinine: 1.41 MG/DL — ABNORMAL HIGH (ref 0.70–1.30)
Globulin: 4 g/dL (ref 2.0–4.0)
Glucose: 109 mg/dL — ABNORMAL HIGH (ref 65–100)
Potassium: 4.7 mmol/L (ref 3.5–5.1)
Protein, total: 7.8 g/dL (ref 6.4–8.2)
Sodium: 136 mmol/L (ref 136–145)
eGFR: 53 mL/min/{1.73_m2} — ABNORMAL LOW (ref >60)

## 2021-11-17 ENCOUNTER — Encounter

## 2021-11-17 MED ORDER — LISINOPRIL-HYDROCHLOROTHIAZIDE 20 MG-25 MG TAB
20-25 mg | ORAL_TABLET | ORAL | 0 refills | Status: AC
Start: 2021-11-17 — End: 2022-01-19

## 2021-11-17 MED ORDER — AMLODIPINE 5 MG TAB
5 mg | ORAL_TABLET | ORAL | 0 refills | Status: AC
Start: 2021-11-17 — End: 2022-01-19

## 2021-12-17 ENCOUNTER — Encounter

## 2021-12-17 MED ORDER — OMEPRAZOLE 40 MG CAP, DELAYED RELEASE
40 mg | ORAL_CAPSULE | ORAL | 0 refills | Status: AC
Start: 2021-12-17 — End: ?

## 2021-12-17 MED ORDER — TAMSULOSIN SR 0.4 MG 24 HR CAP
0.4 mg | ORAL_CAPSULE | ORAL | 0 refills | Status: AC
Start: 2021-12-17 — End: ?

## 2022-01-19 MED ORDER — AMLODIPINE 5 MG TAB
5 mg | ORAL_TABLET | ORAL | 0 refills | Status: AC
Start: 2022-01-19 — End: ?

## 2022-01-19 MED ORDER — LISINOPRIL-HYDROCHLOROTHIAZIDE 20 MG-25 MG TAB
20-25 mg | ORAL_TABLET | ORAL | 0 refills | Status: AC
Start: 2022-01-19 — End: ?

## 2022-01-21 ENCOUNTER — Ambulatory Visit: Admit: 2022-01-21 | Discharge: 2022-01-21 | Payer: MEDICARE | Attending: Internal Medicine | Primary: Internal Medicine

## 2022-01-21 ENCOUNTER — Ambulatory Visit: Attending: Internal Medicine | Primary: Internal Medicine

## 2022-01-21 DIAGNOSIS — I10 Essential (primary) hypertension: Secondary | ICD-10-CM

## 2022-01-21 NOTE — Progress Notes (Signed)
Damon [name] is a 71 y.o. male and presents with Hypertension (/) and Cholesterol Problem  .  Subjective:  Hypertension Review:  The patient has hypertension .  Diet and Lifestyle: generally follows a low sodium diet, exercises sporadically  Home BP Monitoring: is not measured at home.  Pertinent ROS: taking medications as instructed, no medication side effects noted, no TIA's, no chest pain on exertion, no dyspnea on exertion, no swelling of ankles.    Dyslipidemia Review:  Patient presents for evaluation of lipids.  Compliance with treatment thus far has been excellent.  A repeat fasting lipid profile was done.  The patient does not use medications that may worsen dyslipidemias . The patient exercises sporadically.    He has a longstanding history of prostate cancer and offers no new complaints today.  His most recent PSA is unknown.     Health maintenance suggests the needs for a colonoscopy      Damon [name] is a 71 y.o. male and presents for annual Medicare Wellness Visit.    Problem List: Reviewed with patient and discussed risk factors.    Patient Active Problem List   Diagnosis Code    Hypertension I10    GERD (gastroesophageal reflux disease) K21.9    Hyperlipidemia E78.5    Abdominal aortic aneurysm without rupture (HCC) I71.40    GI bleed K92.2    Chronic renal disease, stage III N18.30       Current medical providers:  Patient Care Team:  Helayne Seminole., MD as PCP - General (Internal Medicine Physician)  Helayne Seminole., MD as PCP - [facility] Empaneled Provider    PSH: Reviewed with patient  Past Surgical History:   Procedure Laterality Date    HX ANGIOPLASTY      iliac, prior to percutaneous Endovascular Aortic Repair (EVAR)    HX ORTHOPAEDIC  04/2018    heel surgery    PR UNLISTED PROCEDURE VASCULAR SURGERY  06/18/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        SH: Reviewed with patient  Social History     Tobacco Use    Smoking status: Former     Packs/day: 0.50     Years: 44.00     Pack years:  22.00     Types: Cigarettes     Start date: 12/11/1967    Smokeless tobacco: Former     Quit date: 05/22/2015    Tobacco comments:     Pt. quit smoking 04/2015   Substance Use Topics    Alcohol use: No     Alcohol/week: 0.0 standard drinks     Comment: quit 05/22/2015    Drug use: No       FH: Reviewed with patient  Family History   Problem Relation Age of Onset    Hypertension Mother     Hypertension Sister     Kidney Disease Sister     Hypertension Brother     Heart Disease Brother     Kidney Disease Sister         on dialysis    Heart Disease Sister     Hypertension Sister     Diabetes Father     Heart Disease Father     Hypertension Father     Diabetes Paternal Uncle         deceased, 10's, heart trouble       Medications/Allergies: Reviewed with patient  Current Outpatient Medications on File Prior to Visit  Medication Sig Dispense Refill    amLODIPine (NORVASC) 5 mg tablet Take 1 tablet by mouth once daily for blood pressure 90 Tablet 0    lisinopril-hydroCHLOROthiazide (PRINZIDE, ZESTORETIC) 20-25 mg per tablet Take 1 tablet by mouth once daily 90 Tablet 0    tamsulosin (FLOMAX) 0.4 mg capsule Take 1 capsule by mouth once daily 90 Capsule 0    omeprazole (PRILOSEC) 40 mg capsule TAKE 1 CAPSULE BY MOUTH ONCE DAILY FOR STOMACH 90 Capsule 0    rosuvastatin (CRESTOR) 20 mg tablet Take 1 Tablet by mouth nightly. 90 Tablet 3    Besivance 0.6 % drps ophthalmic suspension INSTILL 1 DROP INTO RIGHT EYE THREE TIMES DAILY AS DIRECTED      Prolensa 0.07 % ophthalmic solution INSTILL 1 DROP INTO RIGHT EYE IN THE EVENING AS DIRECTED      aspirin delayed-release 81 mg tablet Take 1 Tab by mouth daily. 90 Tab 3     No current facility-administered medications on file prior to visit.      No Known Allergies    Objective:  Visit Vitals  BP 110/70 (BP 1 Location: Right arm, BP Patient Position: Sitting, BP Cuff Size: Adult)   Pulse 87   Temp 97.7 F (36.5 C) (Oral)   Resp 19   Ht '5\' 9"'$  (1.753 m)   Wt 208 lb (94.3 kg)   SpO2 97%    BMI 30.72 kg/m    Body mass index is 30.72 kg/m.    Assessment of cognitive impairment: Alert and oriented x 3    Depression Screen:   3 most recent PHQ Screens 10/24/2021   Little interest or pleasure in doing things Not at all   Feeling down, depressed, irritable, or hopeless Not at all   Total Score PHQ 2 0     Depression Review:  Patient is seen for screen of depression,denies anhedonia, weight gain, insomnia, hypersomnia, psychomotor agitation, psychomotor retardation, fatigue, feelings of worthlessness/guilt, difficulty concentrating, hopelessness, impaired memory and recurrent thoughts of death Treatment includes no medication   The denies recurrent thoughts of death and suicidal thoughts without plan.    Fall Risk Assessment:    Fall Risk Assessment, last 12 mths 10/24/2021   Able to walk? Yes   Fall in past 12 months? 0   Do you feel unsteady? 0   Are you worried about falling 0   Number of falls in past 12 months -   Fall with injury? -       Functional Ability:   Does the patient exhibit a steady gait?  yes   How long did it take the patient to get up and walk from a sitting position? seconds   Is the patient self reliant?  (ie can do own laundry, meals, household chores)  yes     Does the patient handle his/her own medications?  yes     Does the patient handle his/her own money?   yes     Is the patient's home safe (ie good lighting, handrails on stairs and bath, etc.)?   yes     Did you notice or did patient express any hearing difficulties?   no     Did you notice or did patient express any vision difficulties?   no     Were distance and reading eye charts used?  no       Advance Care Planning:   Patient was offered the opportunity to discuss advance care planning:  yes  Does patient have an Advance Directive:  no   If no, did you provide information on C[facility] Connections?  yes       Plan:      Orders Placed This Encounter    LIPID PANEL    CBC W/O DIFF    METABOLIC PANEL, COMPREHENSIVE    REFERRAL  TO GASTROENTEROLOGY       Health Maintenance   Topic Date Due    DTaP/Tdap/Td series (1 - Tdap) Never done    Shingles Vaccine (1 of 2) Never done    Low dose CT lung screening  Never done    COVID-19 Vaccine (3 - Booster for Pfizer series) 02/10/2020    Flu Vaccine (Season Ended) 04/21/2022    Colorectal Cancer Screening Combo  07/31/2022    GFR test (Diabetes, CKD 3-4, OR last GFR 15-59)  10/24/2022    Depression Screen  10/24/2022    Lipid Screen  10/24/2022    Medicare Yearly Exam  01/22/2023    Hepatitis C Screening  Completed    AAA Screening 61-72 YO Male Smoking Patients  Completed    Pneumococcal 65+ years  Completed    A1C test (Diabetic or Prediabetic)  Discontinued       *Patient verbalized understanding and agreement with the plan.  A copy of the After Visit Summary with personalized health plan was given to the patient today.      Review of Systems  Constitutional: negative for fevers, chills, anorexia and weight loss  Eyes:   negative for visual disturbance and irritation  ENT:   negative for tinnitus,sore throat,nasal congestion,ear pains.hoarseness  Respiratory:  negative for cough, hemoptysis, dyspnea,wheezing  CV:   negative for chest pain, palpitations, lower extremity edema  GI:   negative for nausea, vomiting, diarrhea, abdominal pain,melena  Endo:               negative for polyuria,polydipsia,polyphagia,heat intolerance  Genitourinary: negative for frequency, dysuria and hematuria  Integument:  negative for rash and pruritus  Hematologic:  negative for easy bruising and gum/nose bleeding  Musculoskel: negative for myalgias, arthralgias, back pain, muscle weakness, joint pain  Neurological:  negative for headaches, dizziness, vertigo, memory problems and gait   Behavl/Psych: negative for feelings of anxiety, depression, mood changes    Past Medical History:   Diagnosis Date    Aneurysm ([place]) 2016    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    Aneurysm ([place]) 2016    Focal distal  thoracic aortic aneurysmal dilatation measuring 5.3 cm     Cancer ([place])     prostate    GERD (gastroesophageal reflux disease)     HTN (hypertension)     Hyperlipidemia      Past Surgical History:   Procedure Laterality Date    HX ANGIOPLASTY      iliac, prior to percutaneous Endovascular Aortic Repair (EVAR)    HX ORTHOPAEDIC  04/2018    heel surgery    PR UNLISTED PROCEDURE VASCULAR SURGERY  06/18/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     Social History     Socioeconomic History    Marital status: SINGLE   Tobacco Use    Smoking status: Former     Packs/day: 0.50     Years: 44.00     Pack years: 22.00     Types: Cigarettes     Start date: 12/11/1967    Smokeless tobacco: Former     Quit date: 05/22/2015    Tobacco  comments:     Pt. quit smoking 04/2015   Substance and Sexual Activity    Alcohol use: No     Alcohol/week: 0.0 standard drinks     Comment: quit 05/22/2015    Drug use: No     Family History   Problem Relation Age of Onset    Hypertension Mother     Hypertension Sister     Kidney Disease Sister     Hypertension Brother     Heart Disease Brother     Kidney Disease Sister         on dialysis    Heart Disease Sister     Hypertension Sister     Diabetes Father     Heart Disease Father     Hypertension Father     Diabetes Paternal Uncle         deceased, 38's, heart trouble     Current Outpatient Medications   Medication Sig Dispense Refill    amLODIPine (NORVASC) 5 mg tablet Take 1 tablet by mouth once daily for blood pressure 90 Tablet 0    lisinopril-hydroCHLOROthiazide (PRINZIDE, ZESTORETIC) 20-25 mg per tablet Take 1 tablet by mouth once daily 90 Tablet 0    tamsulosin (FLOMAX) 0.4 mg capsule Take 1 capsule by mouth once daily 90 Capsule 0    omeprazole (PRILOSEC) 40 mg capsule TAKE 1 CAPSULE BY MOUTH ONCE DAILY FOR STOMACH 90 Capsule 0    rosuvastatin (CRESTOR) 20 mg tablet Take 1 Tablet by mouth nightly. 90 Tablet 3    Besivance 0.6 % drps ophthalmic suspension INSTILL 1 DROP INTO RIGHT EYE THREE TIMES DAILY AS  DIRECTED      Prolensa 0.07 % ophthalmic solution INSTILL 1 DROP INTO RIGHT EYE IN THE EVENING AS DIRECTED      aspirin delayed-release 81 mg tablet Take 1 Tab by mouth daily. 90 Tab 3     No Known Allergies    Objective:  Visit Vitals  BP 110/70 (BP 1 Location: Right arm, BP Patient Position: Sitting, BP Cuff Size: Adult)   Pulse 87   Temp 97.7 F (36.5 C) (Oral)   Resp 19   Ht '5\' 9"'$  (1.753 m)   Wt 208 lb (94.3 kg)   SpO2 97%   BMI 30.72 kg/m     Physical Exam:   General appearance - alert, well appearing, and in no distress  Mental status - alert, oriented to person, place, and time  EYE-PERLA, EOMI, corneas normal, no foreign bodies  ENT-ENT exam normal, no neck nodes or sinus tenderness  Nose - normal and patent, no erythema, discharge or polyps  Mouth - mucous membranes moist, pharynx normal without lesions  Neck - supple, no significant adenopathy   Chest - clear to auscultation, no wheezes, rales or rhonchi, symmetric air entry   Heart - normal rate, regular rhythm, normal S1, S2, no murmurs, rubs, clicks or gallops   Abdomen - soft, nontender, nondistended, no masses or organomegaly  Lymph- no adenopathy palpable  Ext-peripheral pulses normal, no pedal edema, no clubbing or cyanosis  Skin-Warm and dry. no hyperpigmentation, vitiligo, or suspicious lesions  Neuro -alert, oriented, normal speech, no focal findings or movement disorder noted  Neck-normal C-spine, no tenderness, full ROM without pain  Feet-no nail deformities or callus formation with good pulses noted      Results for orders placed or performed in visit on 55/73/22   METABOLIC PANEL, COMPREHENSIVE   Result Value Ref Range    Sodium 136  136 - 145 mmol/L    Potassium 4.7 3.5 - 5.1 mmol/L    Chloride 104 97 - 108 mmol/L    CO2 24 21 - 32 mmol/L    Anion gap 8 5 - 15 mmol/L    Glucose 109 (H) 65 - 100 mg/dL    BUN 20 6 - 20 MG/DL    Creatinine 1.41 (H) 0.70 - 1.30 MG/DL    BUN/Creatinine ratio 14 12 - 20      eGFR 53 (L) >60 ml/min/1.43m     Calcium 9.6 8.5 - 10.1 MG/DL    Bilirubin, total 0.7 0.2 - 1.0 MG/DL    ALT (SGPT) 17 12 - 78 U/L    AST (SGOT) 12 (L) 15 - 37 U/L    Alk. phosphatase 133 (H) 45 - 117 U/L    Protein, total 7.8 6.4 - 8.2 g/dL    Albumin 3.8 3.5 - 5.0 g/dL    Globulin 4.0 2.0 - 4.0 g/dL    A-G Ratio 1.0 (L) 1.1 - 2.2     LIPID PANEL   Result Value Ref Range    Cholesterol, total 200 (H) <200 MG/DL    Triglyceride 159 (H) <150 MG/DL    HDL Cholesterol 36 MG/DL    LDL, calculated 132.2 (H) 0 - 100 MG/DL    VLDL, calculated 31.8 MG/DL    CHOL/HDL Ratio 5.6 (H) 0.0 - 5.0     CBC W/O DIFF   Result Value Ref Range    WBC 5.1 4.1 - 11.1 K/uL    RBC 5.44 4.10 - 5.70 M/uL    HGB 14.0 12.1 - 17.0 g/dL    HCT 45.6 36.6 - 50.3 %    MCV 83.8 80.0 - 99.0 FL    MCH 25.7 (L) 26.0 - 34.0 PG    MCHC 30.7 30.0 - 36.5 g/dL    RDW 15.5 (H) 11.5 - 14.5 %    PLATELET 427 (H) 150 - 400 K/uL    MPV 9.5 8.9 - 12.9 FL    NRBC 0.0 0 PER 100 WBC    ABSOLUTE NRBC 0.00 0.00 - 0.01 K/uL       Assessment/Plan:    ICD-10-CM ICD-9-CM    1. Primary hypertension  I10 401.9 CBC W/O DIFF      METABOLIC PANEL, COMPREHENSIVE      2. Pure hypercholesterolemia  E78.00 272.0 LIPID PANEL      3. [place]  C61 185       4. Medicare annual wellness visit, subsequent  Z00.00 V70.0       5. Screening for colon cancer  Z12.11 V76.51 REFERRAL TO GASTROENTEROLOGY        Orders Placed This Encounter    LIPID PANEL     Standing Status:   Future     Standing Expiration Date:   01/22/2023    CBC W/O DIFF     Standing Status:   Future     Standing Expiration Date:   51/02/1095   METABOLIC PANEL, COMPREHENSIVE     Standing Status:   Future     Standing Expiration Date:   01/22/2023    REFERRAL TO GASTROENTEROLOGY     Referral Priority:   Routine     Referral Type:   Consultation     Referral Reason:   Specialty Services Required     Referred to Provider:   SMacie Burows MD     Requested Specialty:   Gastroenterology  Number of Visits Requested:   1     lose weight, increase  physical activity, follow low fat diet, follow low salt diet, continue present plan, call if any problems  Patient Instructions   MyChart Activation    Thank you for requesting access to MyChart. Please follow the instructions below to securely access and download your online medical record. MyChart allows you to send messages to your doctor, view your test results, renew your prescriptions, schedule appointments, and more.    How Do I Sign Up?    In your internet browser, go to [email or web address]  Click on the First Time User? Click Here link in the Sign In box. You will be redirect to the New Member Sign Up page.  Enter your MyChart Access Code exactly as it appears below. You will not need to use this code after you've completed the sign-up process. If you do not sign up before the expiration date, you must request a new code.    MyChart Access Code: [email or web address]  Expires: 03/07/2022 12:10 PM (This is the date your MyChart access code will expire)    Enter the last four digits of your Social Security Number (xxxx) and Date of Birth (mm/dd/yyyy) as indicated and click Submit. You will be taken to the next sign-up page.  Create a MyChart ID. This will be your MyChart login ID and cannot be changed, so think of one that is secure and easy to remember.  Create a Scientist, research (physical sciences). You can change your password at any time.  Enter your Password Reset Question and Answer. This can be used at a later time if you forget your password.   Enter your e-mail address. You will receive e-mail notification when new information is available in [facility].  Click Sign Up. You can now view and download portions of your medical record.  Click the [employer] link to download a portable copy of your medical information.    Additional Information    If you have questions, please visit the Frequently Asked Questions section of the MyChart website at [email or web address] Remember, MyChart is NOT to be  used for urgent needs. For medical emergencies, dial 911.       Follow-up and Dispositions    Return in about 3 months (around 04/23/2022).         I have reviewed with the patient details of the assessment and plan and all questions were answered. Relevent patient education was performed    An After Visit Summary was printed and given to the patient.

## 2022-01-22 LAB — COMPREHENSIVE METABOLIC PANEL
ALT: 16 U/L (ref 12–78)
AST: 11 U/L — ABNORMAL LOW (ref 15–37)
Albumin/Globulin Ratio: 1 — ABNORMAL LOW (ref 1.1–2.2)
Albumin: 3.8 g/dL (ref 3.5–5.0)
Alkaline Phosphatase: 121 U/L — ABNORMAL HIGH (ref 45–117)
Anion Gap: 7 mmol/L (ref 5–15)
BUN: 20 MG/DL (ref 6–20)
Bun/Cre Ratio: 15 (ref 12–20)
CO2: 23 mmol/L (ref 21–32)
Calcium: 9.5 MG/DL (ref 8.5–10.1)
Chloride: 107 mmol/L (ref 97–108)
Creatinine: 1.37 MG/DL — ABNORMAL HIGH (ref 0.70–1.30)
ESTIMATED GLOMERULAR FILTRATION RATE: 55 mL/min/{1.73_m2} — ABNORMAL LOW (ref >60)
Globulin: 4 g/dL (ref 2.0–4.0)
Glucose: 149 mg/dL — ABNORMAL HIGH (ref 65–100)
Potassium: 4 mmol/L (ref 3.5–5.1)
Sodium: 137 mmol/L (ref 136–145)
Total Bilirubin: 0.6 MG/DL (ref 0.2–1.0)
Total Protein: 7.8 g/dL (ref 6.4–8.2)

## 2022-01-22 LAB — CBC
Hematocrit: 44.2 % (ref 36.6–50.3)
Hemoglobin: 13.7 g/dL (ref 12.1–17.0)
MCH: 25.8 PG — ABNORMAL LOW (ref 26.0–34.0)
MCHC: 31 g/dL (ref 30.0–36.5)
MCV: 83.1 FL (ref 80.0–99.0)
MPV: 9.8 FL (ref 8.9–12.9)
NRBC Absolute: 0 10*3/uL (ref 0.00–0.01)
Nucleated RBCs: 0 PER 100 WBC
Platelets: 361 10*3/uL (ref 150–400)
RBC: 5.32 M/uL (ref 4.10–5.70)
RDW: 15.1 % — ABNORMAL HIGH (ref 11.5–14.5)
WBC: 4.8 10*3/uL (ref 4.1–11.1)

## 2022-01-22 LAB — LIPID PANEL
CHOL/HDL Ratio: 3.5 (ref 0.0–5.0)
Chol/HDL Ratio: 3.5 (ref 0.0–5.0)
Cholesterol, Total: 149 MG/DL (ref <200)
Cholesterol, total: 149 MG/DL (ref <200)
HDL Cholesterol: 42 MG/DL
HDL: 42 MG/DL
LDL Calculated: 71.8 MG/DL (ref 0–100)
LDL, calculated: 71.8 MG/DL (ref 0–100)
Triglyceride: 176 MG/DL — ABNORMAL HIGH (ref <150)
Triglycerides: 176 MG/DL — ABNORMAL HIGH (ref <150)
VLDL Cholesterol Calculated: 35.2 MG/DL
VLDL, calculated: 35.2 MG/DL

## 2022-01-22 LAB — CBC W/O DIFF
ABSOLUTE NRBC: 0 10*3/uL (ref 0.00–0.01)
HCT: 44.2 % (ref 36.6–50.3)
HGB: 13.7 g/dL (ref 12.1–17.0)
MCH: 25.8 PG — ABNORMAL LOW (ref 26.0–34.0)
MCHC: 31 g/dL (ref 30.0–36.5)
MCV: 83.1 FL (ref 80.0–99.0)
MPV: 9.8 FL (ref 8.9–12.9)
PLATELET: 361 10*3/uL (ref 150–400)
RBC: 5.32 M/uL (ref 4.10–5.70)
RDW: 15.1 % — ABNORMAL HIGH (ref 11.5–14.5)
WBC: 4.8 10*3/uL (ref 4.1–11.1)

## 2022-01-22 LAB — METABOLIC PANEL, COMPREHENSIVE
A-G Ratio: 1 — ABNORMAL LOW (ref 1.1–2.2)
ALT (SGPT): 16 U/L (ref 12–78)
AST (SGOT): 11 U/L — ABNORMAL LOW (ref 15–37)
Albumin: 3.8 g/dL (ref 3.5–5.0)
Alk. phosphatase: 121 U/L — ABNORMAL HIGH (ref 45–117)
Anion gap: 7 mmol/L (ref 5–15)
BUN/Creatinine ratio: 15 (ref 12–20)
BUN: 20 MG/DL (ref 6–20)
Bilirubin, total: 0.6 MG/DL (ref 0.2–1.0)
CO2: 23 mmol/L (ref 21–32)
Calcium: 9.5 MG/DL (ref 8.5–10.1)
Chloride: 107 mmol/L (ref 97–108)
Creatinine: 1.37 MG/DL — ABNORMAL HIGH (ref 0.70–1.30)
Globulin: 4 g/dL (ref 2.0–4.0)
Glucose: 149 mg/dL — ABNORMAL HIGH (ref 65–100)
Potassium: 4 mmol/L (ref 3.5–5.1)
Protein, total: 7.8 g/dL (ref 6.4–8.2)
Sodium: 137 mmol/L (ref 136–145)
eGFR: 55 mL/min/{1.73_m2} — ABNORMAL LOW (ref >60)

## 2022-03-26 MED ORDER — TAMSULOSIN HCL 0.4 MG PO CAPS
0.4 MG | ORAL_CAPSULE | ORAL | 0 refills | Status: DC
Start: 2022-03-26 — End: 2022-04-30

## 2022-03-26 NOTE — Telephone Encounter (Signed)
Last appointment: 01/21/2022 MD Nyoka Cowden   Next appointment: 04/23/2022 MD Nyoka Cowden   Previous refill encounter(s):   12/17/2021 Flomax #[id number]     For Pharmacy Admin Tracking Only    Program: Medication Refill  Intervention Detail: New Rx: 1, reason: Patient Preference  Time Spent (min): 5      Requested Prescriptions     Pending Prescriptions Disp Refills    tamsulosin (FLOMAX) 0.4 MG capsule [Pharmacy Med Name: Tamsulosin HCl 0.4 MG Oral Capsule] 90 capsule 0     Sig: Take 1 capsule by mouth once daily

## 2022-04-15 NOTE — Telephone Encounter (Signed)
Pt looking for a refill on his Omeprazole, but would like to know why it has been refused?  Pt would like a call back @ [phone].

## 2022-04-16 NOTE — Telephone Encounter (Signed)
Pt called checking on the status of his OMEPRAZOLE?  Pt stated that it has been 7 days without it and he needs it for his stomach.

## 2022-04-17 MED ORDER — OMEPRAZOLE 40 MG PO CPDR
40 MG | ORAL_CAPSULE | ORAL | 3 refills | Status: AC
Start: 2022-04-17 — End: 2022-04-30

## 2022-04-21 NOTE — Telephone Encounter (Signed)
Filled on 04/17/22

## 2022-04-23 ENCOUNTER — Ambulatory Visit: Payer: MEDICARE | Attending: Internal Medicine | Primary: Internal Medicine

## 2022-04-30 ENCOUNTER — Ambulatory Visit
Admit: 2022-04-30 | Discharge: 2022-04-30 | Payer: PRIVATE HEALTH INSURANCE | Attending: Internal Medicine | Primary: Internal Medicine

## 2022-04-30 DIAGNOSIS — I10 Essential (primary) hypertension: Secondary | ICD-10-CM

## 2022-04-30 MED ORDER — AMLODIPINE BESYLATE 5 MG PO TABS
5 MG | ORAL_TABLET | ORAL | 3 refills | Status: DC
Start: 2022-04-30 — End: 2023-04-20

## 2022-04-30 MED ORDER — OMEPRAZOLE 40 MG PO CPDR
40 MG | ORAL_CAPSULE | ORAL | 3 refills | Status: AC
Start: 2022-04-30 — End: ?

## 2022-04-30 MED ORDER — ROSUVASTATIN CALCIUM 20 MG PO TABS
20 MG | ORAL_TABLET | Freq: Every evening | ORAL | 3 refills | Status: AC
Start: 2022-04-30 — End: ?

## 2022-04-30 MED ORDER — LISINOPRIL-HYDROCHLOROTHIAZIDE 20-25 MG PO TABS
20-25 MG | ORAL_TABLET | Freq: Every day | ORAL | 3 refills | Status: DC
Start: 2022-04-30 — End: 2023-03-27

## 2022-04-30 MED ORDER — TAMSULOSIN HCL 0.4 MG PO CAPS
0.4 MG | ORAL_CAPSULE | Freq: Every day | ORAL | 3 refills | Status: AC
Start: 2022-04-30 — End: ?

## 2022-04-30 NOTE — Progress Notes (Signed)
Damon [name] is a 71 y.o. male and presents with Hypertension and Cholesterol Problem  .  Subjective:  Hypertension Review:  The patient has hypertension .  Diet and Lifestyle: generally follows a low sodium diet, exercises sporadically  Home BP Monitoring: is not measured at home.  Pertinent ROS: taking medications as instructed, no medication side effects noted, no TIA's, no chest pain on exertion, no dyspnea on exertion, no swelling of ankles.    Dyslipidemia Review:  Patient presents for evaluation of lipids.  Compliance with treatment thus far has been excellent.  A repeat fasting lipid profile was done.  The patient does not use medications that may worsen dyslipidemias . The patient exercises sporadically.    He has a longstanding history of prostate cancer and offers no new complaints today.  His most recent PSA is unknown.      GERD Review:   Patient has a history of gastroesophageal reflux with heartburn. Symptoms have been present for a few months.  He denies dysphagia.  He  has not lost weight.  He denies melena, hematochezia, hematemesis, and coffee ground emesis.  This has been associated with fullness after meals.  He denies abdominal bloating and none.  Medical therapy in the past has included proton pump inhibitor        Review of Systems  Constitutional: negative for fevers, chills, anorexia and weight loss  Eyes:   negative for visual disturbance and irritation  ENT:   negative for tinnitus,sore throat,nasal congestion,ear pains.hoarseness  Respiratory:  negative for cough, hemoptysis, dyspnea,wheezing  CV:   negative for chest pain, palpitations, lower extremity edema  GI:   negative for nausea, vomiting, diarrhea, abdominal pain,melena  Endo:               negative for polyuria,polydipsia,polyphagia,heat intolerance  Genitourinary: negative for frequency, dysuria and hematuria  Integument:  negative for rash and pruritus  Hematologic:  negative for easy bruising and gum/nose  bleeding  Musculoskel: negative for myalgias, arthralgias, back pain, muscle weakness, joint pain  Neurological:  negative for headaches, dizziness, vertigo, memory problems and gait   Behavl/Psych: negative for feelings of anxiety, depression, mood changes    Past Medical History:   Diagnosis Date    Aneurysm ([place]) 2016    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    Aneurysm ([place]) 2016    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     Cancer ([place])     prostate    GERD (gastroesophageal reflux disease)     HTN (hypertension)     Hyperlipidemia      Past Surgical History:   Procedure Laterality Date    ANGIOPLASTY      iliac, prior to percutaneous Endovascular Aortic Repair (EVAR)    ORTHOPEDIC SURGERY  04/2018    heel surgery    VASCULAR SURGERY  06/18/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     Social History     Socioeconomic History    Marital status: Single     Spouse name: None    Number of children: None    Years of education: None    Highest education level: None   Tobacco Use    Smoking status: Former     Packs/day: 0.50     Types: Cigarettes     Start date: 12/11/1967    Smokeless tobacco: Former     Quit date: 05/22/2015   Substance and Sexual Activity    Alcohol use: No  Alcohol/week: 0.0 standard drinks    Drug use: No     Family History   Problem Relation Age of Onset    Diabetes Father     Heart Disease Father     Hypertension Father     Diabetes Paternal Uncle         deceased, 40's, heart trouble    Heart Disease Sister     Hypertension Sister     Heart Disease Brother     Hypertension Brother     Kidney Disease Sister     Hypertension Sister     Hypertension Mother     Kidney Disease Sister         on dialysis     Current Outpatient Medications   Medication Sig Dispense Refill    omeprazole (PRILOSEC) 40 MG delayed release capsule TAKE 1 CAPSULE BY MOUTH ONCE DAILY FOR STOMACH 90 capsule 3    tamsulosin (FLOMAX) 0.4 MG capsule Take 1 capsule by mouth daily 90 capsule 3     amLODIPine (NORVASC) 5 MG tablet Take 1 tablet by mouth once daily for blood pressure 90 tablet 3    lisinopril-hydroCHLOROthiazide (PRINZIDE;ZESTORETIC) 20-25 MG per tablet Take 1 tablet by mouth daily 90 tablet 3    rosuvastatin (CRESTOR) 20 MG tablet Take 1 tablet by mouth nightly Take 1 tablet by mouth nightly 90 tablet 3    aspirin 81 MG EC tablet Take 1 tablet by mouth daily      besifloxacin (BESIVANCE) 0.6 % SUSP INSTILL 1 DROP INTO RIGHT EYE THREE TIMES DAILY AS DIRECTED      bromfenac (PROLENSA) 0.07 % SOLN INSTILL 1 DROP INTO RIGHT EYE IN THE EVENING AS DIRECTED       No current facility-administered medications for this visit.     No Known Allergies    Objective:  BP 130/70 (Site: Right Upper Arm, Position: Sitting, Cuff Size: Large Adult)   Pulse 77   Temp 98 F (36.7 C) (Oral)   Resp 20   Ht '5\' 9"'$  (1.753 m)   Wt 199 lb (90.3 kg)   SpO2 97%   BMI 29.39 kg/m   Physical Exam:   General appearance - alert, well appearing, and in no distress  Mental status - alert, oriented to person, place, and time  EYE-PERLA, EOMI, corneas normal, no foreign bodies  ENT-ENT exam normal, no neck nodes or sinus tenderness  Nose - normal and patent, no erythema, discharge or polyps  Mouth - mucous membranes moist, pharynx normal without lesions  Neck - supple, no significant adenopathy   Chest - clear to auscultation, no wheezes, rales or rhonchi, symmetric air entry   Heart - normal rate, regular rhythm, normal S1, S2, no murmurs, rubs, clicks or gallops   Abdomen - soft, nontender, nondistended, no masses or organomegaly  Lymph- no adenopathy palpable  Ext-peripheral pulses normal, no pedal edema, no clubbing or cyanosis  Skin-Warm and dry. no hyperpigmentation, vitiligo, or suspicious lesions  Neuro -alert, oriented, normal speech, no focal findings or movement disorder noted  Neck-normal C-spine, no tenderness, full ROM without pain  Feet-no nail deformities or callus formation with good pulses noted      No  results found for this visit on 04/30/22.    Assessment/Plan:    ICD-10-CM    1. Essential (primary) hypertension  I10 CBC     Comprehensive Metabolic Panel      2. Pure hypercholesterolemia, unspecified  E78.00 Lipid Panel  3. Malignant neoplasm of prostate (HCC)  C61 PSA, Diagnostic      4. Gastro-esophageal reflux disease without esophagitis  K21.9         Orders Placed This Encounter   Procedures    CBC     Standing Status:   Future     Standing Expiration Date:   05/01/2023    Comprehensive Metabolic Panel     Standing Status:   Future     Standing Expiration Date:   05/01/2023    Lipid Panel     Standing Status:   Future     Standing Expiration Date:   05/01/2023    PSA, Diagnostic     Standing Status:   Future     Standing Expiration Date:   05/01/2023     lose weight, follow low fat diet, follow low salt diet, continue present plan, call if any problems,  Patient Instructions   Thank you for enrolling in [facility]. Please follow the instructions below to securely access your online medical record. MyChart allows you to send messages to your doctor, view your test results, renew your prescriptions, schedule appointments, and more.     How Do I Sign Up?  In your Internet browser, go to [email or web address]  Click on the Sign Up Now link in the Sign In box. You will see the New Member Sign Up page.  Enter your MyChart Access Code exactly as it appears below. You will not need to use this code after you've completed the sign-up process. If you do not sign up before the expiration date, you must request a new code.  MyChart Access Code: [email or web address]  Expires: 06/14/2022  2:44 PM    Enter your Social Security Number ([id number]) and Date of Birth (mm/dd/yyyy) as indicated and click Submit. You will be taken to the next sign-up page.  Create a MyChart ID. This will be your MyChart login ID and cannot be changed, so think of one that is secure and easy to remember.  Create a Scientist, research (physical sciences).  You can change your password at any time.  Enter your Password Reset Question and Answer. This can be used at a later time if you forget your password.   Enter your e-mail address. You will receive e-mail notification when new information is available in [facility].  Click Sign Up. You can now view your medical record.     Additional Information  If you have questions, please contact your physician practice where you receive care. Remember, MyChart is NOT to be used for urgent needs. For medical emergencies, dial 911.     Follow-up and Dispositions    Return in about 3 months (around 07/31/2022).           I have reviewed with the patient details of the assessment and plan and all questions were answered. Relevent patient education was performed.The most recent lab findings were reviewed with the patient.    An After Visit Summary was printed and given to the patient.

## 2022-04-30 NOTE — Patient Instructions (Signed)
Thank you for enrolling in [facility]. Please follow the instructions below to securely access your online medical record. MyChart allows you to send messages to your doctor, view your test results, renew your prescriptions, schedule appointments, and more.     How Do I Sign Up?  In your Internet browser, go to [email or web address]  Click on the Sign Up Now link in the Sign In box. You will see the New Member Sign Up page.  Enter your MyChart Access Code exactly as it appears below. You will not need to use this code after you've completed the sign-up process. If you do not sign up before the expiration date, you must request a new code.  MyChart Access Code: [email or web address]  Expires: 06/14/2022  2:44 PM    Enter your Social Security Number ([id number]) and Date of Birth (mm/dd/yyyy) as indicated and click Submit. You will be taken to the next sign-up page.  Create a MyChart ID. This will be your MyChart login ID and cannot be changed, so think of one that is secure and easy to remember.  Create a Scientist, research (physical sciences). You can change your password at any time.  Enter your Password Reset Question and Answer. This can be used at a later time if you forget your password.   Enter your e-mail address. You will receive e-mail notification when new information is available in [facility].  Click Sign Up. You can now view your medical record.     Additional Information  If you have questions, please contact your physician practice where you receive care. Remember, MyChart is NOT to be used for urgent needs. For medical emergencies, dial 911.

## 2022-05-01 LAB — CBC
Hematocrit: 45.5 % (ref 36.6–50.3)
Hemoglobin: 13.8 g/dL (ref 12.1–17.0)
MCH: 25 PG — ABNORMAL LOW (ref 26.0–34.0)
MCHC: 30.3 g/dL (ref 30.0–36.5)
MCV: 82.3 FL (ref 80.0–99.0)
MPV: 9.3 FL (ref 8.9–12.9)
Nucleated RBCs: 0 PER 100 WBC
Platelets: 432 10*3/uL — ABNORMAL HIGH (ref 150–400)
RBC: 5.53 M/uL (ref 4.10–5.70)
RDW: 15.1 % — ABNORMAL HIGH (ref 11.5–14.5)
WBC: 5.8 10*3/uL (ref 4.1–11.1)
nRBC: 0 10*3/uL (ref 0.00–0.01)

## 2022-05-01 LAB — LIPID PANEL
Chol/HDL Ratio: 3.6 (ref 0.0–5.0)
Cholesterol, Total: 163 MG/DL (ref <200)
HDL: 45 MG/DL
LDL Calculated: 84.4 MG/DL (ref 0–100)
Triglycerides: 168 MG/DL — ABNORMAL HIGH (ref <150)
VLDL Cholesterol Calculated: 33.6 MG/DL

## 2022-05-01 LAB — COMPREHENSIVE METABOLIC PANEL
ALT: 16 U/L (ref 12–78)
AST: 10 U/L — ABNORMAL LOW (ref 15–37)
Albumin/Globulin Ratio: 0.8 — ABNORMAL LOW (ref 1.1–2.2)
Albumin: 3.7 g/dL (ref 3.5–5.0)
Alk Phosphatase: 146 U/L — ABNORMAL HIGH (ref 45–117)
Anion Gap: 8 mmol/L (ref 5–15)
BUN: 16 MG/DL (ref 6–20)
Bun/Cre Ratio: 15 (ref 12–20)
CO2: 24 mmol/L (ref 21–32)
Calcium: 9.9 MG/DL (ref 8.5–10.1)
Chloride: 105 mmol/L (ref 97–108)
Creatinine: 1.09 MG/DL (ref 0.70–1.30)
Est, Glom Filt Rate: >60 mL/min/{1.73_m2} (ref >60)
Globulin: 4.5 g/dL — ABNORMAL HIGH (ref 2.0–4.0)
Glucose: 113 mg/dL — ABNORMAL HIGH (ref 65–100)
Potassium: 4.5 mmol/L (ref 3.5–5.1)
Sodium: 137 mmol/L (ref 136–145)
Total Bilirubin: 0.6 MG/DL (ref 0.2–1.0)
Total Protein: 8.2 g/dL (ref 6.4–8.2)

## 2022-05-01 LAB — PSA, DIAGNOSTIC: PSA: 0.4 ng/mL (ref 0.01–4.0)

## 2022-07-31 ENCOUNTER — Ambulatory Visit: Admit: 2022-07-31 | Discharge: 2022-07-31 | Payer: MEDICARE | Attending: Internal Medicine | Primary: Internal Medicine

## 2022-07-31 DIAGNOSIS — I10 Essential (primary) hypertension: Secondary | ICD-10-CM

## 2022-07-31 NOTE — Progress Notes (Signed)
Damon [name] is a 71 y.o. male and presents with Hypertension and Cholesterol Problem  .  Subjective:  Hypertension Review:  The patient has hypertension .  Diet and Lifestyle: generally follows a low sodium diet, exercises sporadically  Home BP Monitoring: is not measured at home.  Pertinent ROS: taking medications as instructed, no medication side effects noted, no TIA's, no chest pain on exertion, no dyspnea on exertion, no swelling of ankles.    Dyslipidemia Review:  Patient presents for evaluation of lipids.  Compliance with treatment thus far has been excellent.  A repeat fasting lipid profile was done.  The patient does not use medications that may worsen dyslipidemias . The patient exercises sporadically.    BPH Review:  He has no burning on urination,dysuria or dribbling reported.He has no frequency on urination.  He has been on Flomax and tolerating it well.        Review of Systems  Constitutional: negative for fevers, chills, anorexia and weight loss  Eyes:   negative for visual disturbance and irritation  ENT:   negative for tinnitus,sore throat,nasal congestion,ear pains.hoarseness  Respiratory:  negative for cough, hemoptysis, dyspnea,wheezing  CV:   negative for chest pain, palpitations, lower extremity edema  GI:   negative for nausea, vomiting, diarrhea, abdominal pain,melena  Endo:               negative for polyuria,polydipsia,polyphagia,heat intolerance  Genitourinary: negative for frequency, dysuria and hematuria  Integument:  negative for rash and pruritus  Hematologic:  negative for easy bruising and gum/nose bleeding  Musculoskel: negative for myalgias, arthralgias, back pain, muscle weakness, joint pain  Neurological:  negative for headaches, dizziness, vertigo, memory problems and gait   Behavl/Psych: negative for feelings of anxiety, depression, mood changes    Past Medical History:   Diagnosis Date    Aneurysm ([place]) 2016    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     Aneurysm ([place]) 2016    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     Cancer ([place])     prostate    GERD (gastroesophageal reflux disease)     HTN (hypertension)     Hyperlipidemia      Past Surgical History:   Procedure Laterality Date    ANGIOPLASTY      iliac, prior to percutaneous Endovascular Aortic Repair (EVAR)    ORTHOPEDIC SURGERY  04/2018    heel surgery    VASCULAR SURGERY  06/18/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     Social History     Socioeconomic History    Marital status: Single     Spouse name: None    Number of children: None    Years of education: None    Highest education level: None   Tobacco Use    Smoking status: Former     Packs/day: .5     Types: Cigarettes     Start date: 12/11/1967    Smokeless tobacco: Former     Quit date: 05/22/2015   Vaping Use    Vaping Use: Never used   Substance and Sexual Activity    Alcohol use: No     Alcohol/week: 0.0 standard drinks of alcohol    Drug use: No    Sexual activity: Defer     Family History   Problem Relation Age of Onset    Diabetes Father     Heart Disease Father     Hypertension Father  Diabetes Paternal Uncle         deceased, 54's, heart trouble    Heart Disease Sister     Hypertension Sister     Heart Disease Brother     Hypertension Brother     Kidney Disease Sister     Hypertension Sister     Hypertension Mother     Kidney Disease Sister         on dialysis     Current Outpatient Medications   Medication Sig Dispense Refill    omeprazole (PRILOSEC) 40 MG delayed release capsule TAKE 1 CAPSULE BY MOUTH ONCE DAILY FOR STOMACH 90 capsule 3    tamsulosin (FLOMAX) 0.4 MG capsule Take 1 capsule by mouth daily 90 capsule 3    amLODIPine (NORVASC) 5 MG tablet Take 1 tablet by mouth once daily for blood pressure 90 tablet 3    lisinopril-hydroCHLOROthiazide (PRINZIDE;ZESTORETIC) 20-25 MG per tablet Take 1 tablet by mouth daily 90 tablet 3    rosuvastatin (CRESTOR) 20 MG tablet Take 1 tablet by mouth nightly Take 1 tablet by  mouth nightly 90 tablet 3    aspirin 81 MG EC tablet Take 1 tablet by mouth daily      besifloxacin (BESIVANCE) 0.6 % SUSP INSTILL 1 DROP INTO RIGHT EYE THREE TIMES DAILY AS DIRECTED      bromfenac (PROLENSA) 0.07 % SOLN INSTILL 1 DROP INTO RIGHT EYE IN THE EVENING AS DIRECTED       No current facility-administered medications for this visit.     No Known Allergies    Objective:  BP 120/62 (Site: Right Upper Arm, Position: Sitting, Cuff Size: Large Adult)   Pulse 91   Temp 98.5 F (36.9 C) (Oral)   Resp 16   Ht 1.753 m ('5\' 9"'$ )   Wt 89.8 kg (198 lb)   SpO2 97%   BMI 29.24 kg/m   Physical Exam:   General appearance - alert, well appearing, and in no distress  Mental status - alert, oriented to person, place, and time  EYE-PERLA, EOMI, corneas normal, no foreign bodies  ENT-ENT exam normal, no neck nodes or sinus tenderness  Nose - normal and patent, no erythema, discharge or polyps  Mouth - mucous membranes moist, pharynx normal without lesions  Neck - supple, no significant adenopathy   Chest - clear to auscultation, no wheezes, rales or rhonchi, symmetric air entry   Heart - normal rate, regular rhythm, normal S1, S2, no murmurs, rubs, clicks or gallops   Abdomen - soft, nontender, nondistended, no masses or organomegaly  Lymph- no adenopathy palpable  Ext-peripheral pulses normal, no pedal edema, no clubbing or cyanosis  Skin-Warm and dry. no hyperpigmentation, vitiligo, or suspicious lesions  Neuro -alert, oriented, normal speech, no focal findings or movement disorder noted  Neck-normal C-spine, no tenderness, full ROM without pain  Feet-no nail deformities or callus formation with good pulses noted      No results found for this visit on 07/31/22.    Assessment/Plan:    ICD-10-CM    1. Essential (primary) hypertension  I10 CBC     Comprehensive Metabolic Panel      2. Pure hypercholesterolemia, unspecified  E78.00         Orders Placed This Encounter   Procedures    CBC     Standing Status:   Future      Standing Expiration Date:   08/01/2023    Comprehensive Metabolic Panel     Standing Status:  Future     Standing Expiration Date:   08/01/2023     lose weight, increase physical activity, follow low fat diet, follow low salt diet, call if any problems,  There are no Patient Instructions on file for this visit.         I have reviewed with the patient details of the assessment and plan and all questions were answered. Relevent patient education was performed.The most recent lab findings were reviewed with the patient.    An After Visit Summary was printed and given to the patient.

## 2022-08-01 LAB — CBC
Hematocrit: 38.6 % (ref 36.6–50.3)
Hemoglobin: 11.8 g/dL — ABNORMAL LOW (ref 12.1–17.0)
MCH: 24.7 PG — ABNORMAL LOW (ref 26.0–34.0)
MCHC: 30.6 g/dL (ref 30.0–36.5)
MCV: 80.8 FL (ref 80.0–99.0)
MPV: 9 FL (ref 8.9–12.9)
Nucleated RBCs: 0 PER 100 WBC
Platelets: 457 10*3/uL — ABNORMAL HIGH (ref 150–400)
RBC: 4.78 M/uL (ref 4.10–5.70)
RDW: 16.3 % — ABNORMAL HIGH (ref 11.5–14.5)
WBC: 6.8 10*3/uL (ref 4.1–11.1)
nRBC: 0 10*3/uL (ref 0.00–0.01)

## 2022-08-01 LAB — COMPREHENSIVE METABOLIC PANEL
ALT: 12 U/L (ref 12–78)
AST: 9 U/L — ABNORMAL LOW (ref 15–37)
Albumin/Globulin Ratio: 0.9 — ABNORMAL LOW (ref 1.1–2.2)
Albumin: 3.6 g/dL (ref 3.5–5.0)
Alk Phosphatase: 130 U/L — ABNORMAL HIGH (ref 45–117)
Anion Gap: 6 mmol/L (ref 5–15)
BUN/Creatinine Ratio: 15 (ref 12–20)
BUN: 21 MG/DL — ABNORMAL HIGH (ref 6–20)
CO2: 25 mmol/L (ref 21–32)
Calcium: 9.4 MG/DL (ref 8.5–10.1)
Chloride: 104 mmol/L (ref 97–108)
Creatinine: 1.38 MG/DL — ABNORMAL HIGH (ref 0.70–1.30)
Est, Glom Filt Rate: 54 mL/min/{1.73_m2} — ABNORMAL LOW (ref >60)
Globulin: 4.2 g/dL — ABNORMAL HIGH (ref 2.0–4.0)
Glucose: 94 mg/dL (ref 65–100)
Potassium: 4.8 mmol/L (ref 3.5–5.1)
Sodium: 135 mmol/L — ABNORMAL LOW (ref 136–145)
Total Bilirubin: 0.6 MG/DL (ref 0.2–1.0)
Total Protein: 7.8 g/dL (ref 6.4–8.2)

## 2022-08-01 LAB — LIPID PANEL
Chol/HDL Ratio: 3.7 (ref 0.0–5.0)
Cholesterol, Total: 160 MG/DL (ref <200)
HDL: 43 MG/DL
LDL Calculated: 94.4 MG/DL (ref 0–100)
Triglycerides: 113 MG/DL (ref <150)
VLDL Cholesterol Calculated: 22.6 MG/DL

## 2022-11-02 ENCOUNTER — Ambulatory Visit: Payer: MEDICARE | Attending: Internal Medicine | Primary: Internal Medicine

## 2022-11-16 ENCOUNTER — Ambulatory Visit: Admit: 2022-11-16 | Discharge: 2022-11-16 | Payer: MEDICARE | Attending: Internal Medicine | Primary: Internal Medicine

## 2022-11-16 DIAGNOSIS — I10 Essential (primary) hypertension: Secondary | ICD-10-CM

## 2022-11-16 MED ORDER — NORMAL SALINE FLUSH 0.9 % IV SOLN
0.9 % | INTRAVENOUS | Status: DC | PRN
Start: 2022-11-16 — End: 2022-11-17

## 2022-11-16 MED ORDER — SODIUM CHLORIDE 0.9 % IV SOLN
0.9 % | INTRAVENOUS | Status: DC | PRN
Start: 2022-11-16 — End: 2022-11-17
  Administered 2022-11-17: 14:00:00 25 mL via INTRAVENOUS

## 2022-11-16 MED ORDER — NORMAL SALINE FLUSH 0.9 % IV SOLN
0.9 % | Freq: Two times a day (BID) | INTRAVENOUS | Status: DC
Start: 2022-11-16 — End: 2022-11-17

## 2022-11-16 NOTE — Progress Notes (Addendum)
Medicare Annual Wellness Visit    Damon [name] is here for Hypertension, Cholesterol Problem, and Medicare AWV    Assessment & Plan   Essential (primary) hypertension  -     CBC; Future  -     Comprehensive Metabolic Panel; Future  Pure hypercholesterolemia, unspecified  -     Lipid Panel; Future  Gastro-esophageal reflux disease without esophagitis  Pre-diabetes  -     Hemoglobin A1C; Future  Medicare annual wellness visit, subsequent    Recommendations for Preventive Services Due: see orders and patient instructions/AVS.  Recommended screening schedule for the next 5-10 years is provided to the patient in written form: see Patient Instructions/AVS.     Return in about 3 months (around 02/14/2023), or if symptoms worsen or fail to improve.     Subjective   Hypertension Review:  The patient has hypertension .  Diet and Lifestyle: generally follows a low sodium diet, exercises sporadically  Home BP Monitoring: is not measured at home.  Pertinent ROS: taking medications as instructed, no medication side effects noted, no TIA's, no chest pain on exertion, no dyspnea on exertion, no swelling of ankles.    Dyslipidemia Review:  Patient presents for evaluation of lipids.  Compliance with treatment thus far has been excellent.  A repeat fasting lipid profile was done.  The patient does not use medications that may worsen dyslipidemias . The patient exercises sporadically.    BPH Review:  He has no burning on urination,dysuria or dribbling reported.He has no frequency on urination.  He has been on Flomax and tolerating it well.    GERD Review:   Patient has a history of gastroesophageal reflux with heartburn. Symptoms have been present for a few months.  He denies dysphagia.  He  has not lost weight.  He denies melena, hematochezia, hematemesis, and coffee ground emesis.  This has been associated with fullness after meals.  He denies abdominal bloating and none.  Medical therapy in the past has included proton pump  inhibitor      Patient's complete Health Risk Assessment and screening values have been reviewed and are found in Flowsheets. The following problems were reviewed today and where indicated follow up appointments were made and/or referrals ordered.    Positive Risk Factor Screenings with Interventions:                      Advanced Directives:  Do you have a Living Will?: (!) No    Intervention:                       Objective   Vitals:    11/16/22 0911   BP: 110/60   Site: Right Upper Arm   Position: Sitting   Cuff Size: Large Adult   Pulse: 90   Resp: 20   Temp: 98 F (36.7 C)   TempSrc: Oral   SpO2: 93%   Weight: 88.9 kg (196 lb)   Height: 1.753 m (5' 9"$ )      Body mass index is 28.94 kg/m.        General Appearance: alert and oriented to person, place and time, well developed and well- nourished, in no acute distress  Skin: warm and dry, no rash or erythema  Head: normocephalic and atraumatic  Eyes: pupils equal, round, and reactive to light, extraocular eye movements intact, conjunctivae normal  ENT: tympanic membrane, external ear and ear canal normal bilaterally, nose without deformity, nasal mucosa  and turbinates normal without polyps  Neck: supple and non-tender without mass, no thyromegaly or thyroid nodules, no cervical lymphadenopathy  Pulmonary/Chest: clear to auscultation bilaterally- no wheezes, rales or rhonchi, normal air movement, no respiratory distress  Cardiovascular: normal rate, regular rhythm, normal S1 and S2, no murmurs, rubs, clicks, or gallops, distal pulses intact, no carotid bruits  Abdomen: soft, non-tender, non-distended, normal bowel sounds, no masses or organomegaly  Extremities: no cyanosis, clubbing or edema  Musculoskeletal: normal range of motion, no joint swelling, deformity or tenderness  Neurologic: reflexes normal and symmetric, no cranial nerve deficit, gait, coordination and speech normal       No Known Allergies  Prior to Visit Medications    Medication Sig Taking?  Authorizing Provider   omeprazole (PRILOSEC) 40 MG delayed release capsule TAKE 1 CAPSULE BY MOUTH ONCE DAILY FOR STOMACH Yes Helayne Seminole., MD   tamsulosin (FLOMAX) 0.4 MG capsule Take 1 capsule by mouth daily Yes Helayne Seminole., MD   amLODIPine (NORVASC) 5 MG tablet Take 1 tablet by mouth once daily for blood pressure Yes Helayne Seminole., MD   lisinopril-hydroCHLOROthiazide (PRINZIDE;ZESTORETIC) 20-25 MG per tablet Take 1 tablet by mouth daily Yes Helayne Seminole., MD   rosuvastatin (CRESTOR) 20 MG tablet Take 1 tablet by mouth nightly Take 1 tablet by mouth nightly Yes Helayne Seminole., MD   aspirin 81 MG EC tablet Take 1 tablet by mouth daily Yes Automatic Reconciliation, Ar       CareTeam (Including outside providers/suppliers regularly involved in providing care):   Patient Care Team:  Helayne Seminole., MD as PCP - General  Nyoka Cowden, Cheri Guppy., MD as PCP - Empaneled Provider     Reviewed and updated this visit:  Tobacco  Allergies  Meds  Med Hx  Surg Hx  Soc Hx  Fam Hx

## 2022-11-16 NOTE — Patient Instructions (Signed)
Advance Directives: Care Instructions  Overview  An advance directive is a legal way to state your wishes at the end of your life. It tells your family and your doctor what to do if you can't say what you want.  There are two main types of advance directives. You can change them any time your wishes change.  Living will.  This form tells your family and your doctor your wishes about life support and other treatment. The form is also called a declaration.  Medical power of attorney.  This form lets you name a person to make treatment decisions for you when you can't speak for yourself. This person is called a health care agent (health care proxy, health care surrogate). The form is also called a durable power of attorney for health care.  If you do not have an advance directive, decisions about your medical care may be made by a family member, or by a doctor or a judge who doesn't know you.  It may help to think of an advance directive as a gift to the people who care for you. If you have one, they won't have to make tough decisions by themselves.  For more information, including forms for your state, see the [place] website (Rebanking[employer].hu).  Follow-up care is a key part of your treatment and safety. Be sure to make and go to all appointments, and call your doctor if you are having problems. It's also a good idea to know your test results and keep a list of the medicines you take.  What should you include in an advance directive?  Many states have a unique advance directive form. (It may ask you to address specific issues.) Or you might use a universal form that's approved by many states.  If your form doesn't tell you what to address, it may be hard to know what to include in your advance directive. Use the questions below to help you get started.  Who do you want to make decisions about your medical care if you are not able to?  What life-support measures do you want if you  have a serious illness that gets worse over time or can't be cured?  What are you most afraid of that might happen? (Maybe you're afraid of having pain, losing your independence, or being kept alive by machines.)  Where would you prefer to die? (Your home? A hospital? A nursing home?)  Do you want to donate your organs when you die?  Do you want certain religious practices performed before you die?  When should you call for help?  Be sure to contact your doctor if you have any questions.  Where can you learn more?  Go to https://www.bennett.info/ and enter R264 to learn more about "Advance Directives: Care Instructions."  Current as of: March 26, 2023Content Version: 13.9   2006-2023 Healthwise, Incorporated.   Care instructions adapted under license by [facility]. If you have questions about a medical condition or this instruction, always ask your healthcare professional. [place] any warranty or liability for your use of this information.           A Healthy Heart: Care Instructions  Your Care Instructions     Coronary artery disease, also called heart disease, occurs when a substance called plaque builds up in the vessels that supply oxygen-rich blood to your heart muscle. This can narrow the blood vessels and reduce blood flow. A heart attack happens when blood flow is completely blocked.  A high-fat diet, smoking, and other factors increase the risk of heart disease.  Your doctor has found that you have a chance of having heart disease. You can do lots of things to keep your heart healthy. It may not be easy, but you can change your diet, exercise more, and quit smoking. These steps really work to lower your chance of heart disease.  Follow-up care is a key part of your treatment and safety. Be sure to make and go to all appointments, and call your doctor if you are having problems. It's also a good idea to know your test results and keep a list of the  medicines you take.  How can you care for yourself at home?  Diet   Use less salt when you cook and eat. This helps lower your blood pressure. Taste food before salting. Add only a little salt when you think you need it. With time, your taste buds will adjust to less salt.    Eat fewer snack items, fast foods, canned soups, and other high-salt, high-fat, processed foods.    Read food labels and try to avoid saturated and trans fats. They increase your risk of heart disease by raising cholesterol levels.    Limit the amount of solid fat-butter, margarine, and shortening-you eat. Use olive, peanut, or canola oil when you cook. Bake, broil, and steam foods instead of frying them.    Eat a variety of fruit and vegetables every day. Dark green, deep orange, red, or yellow fruits and vegetables are especially good for you. Examples include spinach, carrots, peaches, and berries.    Foods high in fiber can reduce your cholesterol and provide important vitamins and minerals. High-fiber foods include whole-grain cereals and breads, oatmeal, beans, brown rice, citrus fruits, and apples.    Eat lean proteins. Heart-healthy proteins include seafood, lean meats and poultry, eggs, beans, peas, nuts, seeds, and soy products.    Limit drinks and foods with added sugar. These include candy, desserts, and soda pop.   Lifestyle changes   If your doctor recommends it, get more exercise. Walking is a good choice. Bit by bit, increase the amount you walk every day. Try for at least 30 minutes on most days of the week. You also may want to swim, bike, or do other activities.    Do not smoke. If you need help quitting, talk to your doctor about stop-smoking programs and medicines. These can increase your chances of quitting for good. Quitting smoking may be the most important step you can take to protect your heart. It is never too late to quit.    Limit alcohol to 2 drinks a day for men and 1 drink a day for women. Too much  alcohol can cause health problems.    Manage other health problems such as diabetes, high blood pressure, and high cholesterol. If you think you may have a problem with alcohol or drug use, talk to your doctor.   Medicines   Take your medicines exactly as prescribed. Call your doctor if you think you are having a problem with your medicine.    If your doctor recommends aspirin, take the amount directed each day. Make sure you take aspirin and not another kind of pain reliever, such as acetaminophen (Tylenol).   When should you call for help?   Call 911 if you have symptoms of a heart attack. These may include:   Chest pain or pressure, or a strange feeling in the chest.  Sweating.    Shortness of breath.    Pain, pressure, or a strange feeling in the back, neck, jaw, or upper belly or in one or both shoulders or arms.    Lightheadedness or sudden weakness.    A fast or irregular heartbeat.   After you call 911, the operator may tell you to chew 1 adult-strength or 2 to 4 low-dose aspirin. Wait for an ambulance. Do not try to drive yourself.  Watch closely for changes in your health, and be sure to contact your doctor if you have any problems.  Where can you learn more?  Go to https://www.bennett.info/ and enter F075 to learn more about "A Healthy Heart: Care Instructions."  Current as of: June 25, 2023Content Version: 13.9   2006-2023 Healthwise, Incorporated.   Care instructions adapted under license by [facility]. If you have questions about a medical condition or this instruction, always ask your healthcare professional. [place] any warranty or liability for your use of this information.      Personalized Preventive Plan for Damon [name] - 11/16/2022  Medicare offers a range of preventive health benefits. Some of the tests and screenings are paid in full while other may be subject to a deductible, co-insurance, and/or copay.    Some of these  benefits include a comprehensive review of your medical history including lifestyle, illnesses that may run in your family, and various assessments and screenings as appropriate.    After reviewing your medical record and screening and assessments performed today your provider may have ordered immunizations, labs, imaging, and/or referrals for you.  A list of these orders (if applicable) as well as your Preventive Care list are included within your After Visit Summary for your review.    Other Preventive Recommendations:    A preventive eye exam performed by an eye specialist is recommended every 1-2 years to screen for glaucoma; cataracts, macular degeneration, and other eye disorders.  A preventive dental visit is recommended every 6 months.  Try to get at least 150 minutes of exercise per week or 10,000 steps per day on a pedometer .  Order or download the FREE "Exercise & Physical Activity: Your Everyday Guide" from [employer]. Call (901)831-1145 or search [employer] online.  You need 1200-1500 mg of calcium and 1000-2000 IU of vitamin D per day. It is possible to meet your calcium requirement with diet alone, but a vitamin D supplement is usually necessary to meet this goal.  When exposed to the sun, use a sunscreen that protects against both UVA and UVB radiation with an SPF of 30 or greater. Reapply every 2 to 3 hours or after sweating, drying off with a towel, or swimming.  Always wear a seat belt when traveling in a car. Always wear a helmet when riding a bicycle or motorcycle.

## 2022-11-16 NOTE — Progress Notes (Signed)
"  Have you been to the ER, urgent care clinic since your last visit?  Hospitalized since your last visit?"    no    "Have you seen or consulted any other health care providers outside of [facility] since your last visit?"    Yes GI DR Levi Aland

## 2022-11-17 ENCOUNTER — Inpatient Hospital Stay: Payer: Medicare (Managed Care) | Attending: Gastroenterology

## 2022-11-17 LAB — COMPREHENSIVE METABOLIC PANEL
ALT: 13 U/L (ref 12–78)
AST: 9 U/L — ABNORMAL LOW (ref 15–37)
Albumin/Globulin Ratio: 0.8 — ABNORMAL LOW (ref 1.1–2.2)
Albumin: 3.5 g/dL (ref 3.5–5.0)
Alk Phosphatase: 123 U/L — ABNORMAL HIGH (ref 45–117)
Anion Gap: 8 mmol/L (ref 5–15)
BUN: 22 MG/DL — ABNORMAL HIGH (ref 6–20)
Bun/Cre Ratio: 17 (ref 12–20)
CO2: 22 mmol/L (ref 21–32)
Calcium: 9.5 MG/DL (ref 8.5–10.1)
Chloride: 107 mmol/L (ref 97–108)
Creatinine: 1.29 MG/DL (ref 0.70–1.30)
Est, Glom Filt Rate: 59 mL/min/{1.73_m2} — ABNORMAL LOW (ref >60)
Globulin: 4.4 g/dL — ABNORMAL HIGH (ref 2.0–4.0)
Glucose: 98 mg/dL (ref 65–100)
Potassium: 4.6 mmol/L (ref 3.5–5.1)
Sodium: 137 mmol/L (ref 136–145)
Total Bilirubin: 0.7 MG/DL (ref 0.2–1.0)
Total Protein: 7.9 g/dL (ref 6.4–8.2)

## 2022-11-17 LAB — CBC
Hematocrit: 40.4 % (ref 36.6–50.3)
Hemoglobin: 13.1 g/dL (ref 12.1–17.0)
MCH: 25.2 PG — ABNORMAL LOW (ref 26.0–34.0)
MCHC: 32.4 g/dL (ref 30.0–36.5)
MCV: 77.8 FL — ABNORMAL LOW (ref 80.0–99.0)
MPV: 9.4 FL (ref 8.9–12.9)
Nucleated RBCs: 0 PER 100 WBC
Platelets: 377 10*3/uL (ref 150–400)
RBC: 5.19 M/uL (ref 4.10–5.70)
RDW: 17.2 % — ABNORMAL HIGH (ref 11.5–14.5)
WBC: 5.1 10*3/uL (ref 4.1–11.1)
nRBC: 0 10*3/uL (ref 0.00–0.01)

## 2022-11-17 LAB — LIPID PANEL
Chol/HDL Ratio: 4.7 (ref 0.0–5.0)
Cholesterol, Total: 147 MG/DL (ref <200)
HDL: 31 MG/DL
LDL Calculated: 82.4 MG/DL (ref 0–100)
Triglycerides: 168 MG/DL — ABNORMAL HIGH (ref <150)
VLDL Cholesterol Calculated: 33.6 MG/DL

## 2022-11-17 LAB — HEMOGLOBIN A1C
Estimated Avg Glucose: 128 mg/dL
Hemoglobin A1C: 6.1 % — ABNORMAL HIGH (ref 4.0–5.6)

## 2022-11-17 MED ORDER — LIDOCAINE HCL 2 % IJ SOLN (MIXTURES ONLY)
2 % | INTRAMUSCULAR | Status: DC | PRN
  Administered 2022-11-17: 15:00:00 100 via INTRAVENOUS

## 2022-11-17 MED ORDER — PHENYLEPHRINE HCL (PRESSORS) 0.4 MG/10ML IV SOSY
0.4 MG/10ML | INTRAVENOUS | Status: DC | PRN
  Administered 2022-11-17: 15:00:00 80 via INTRAVENOUS

## 2022-11-17 MED ORDER — PROPOFOL 100 MG/10ML IV EMUL
100 MG/10ML | INTRAVENOUS | Status: DC | PRN
  Administered 2022-11-17 (×2): 50 via INTRAVENOUS
  Administered 2022-11-17: 15:00:00 100 via INTRAVENOUS

## 2022-11-17 NOTE — Op Note (Signed)
NAME:  Damon [name]   DOB:   1951/03/26   MRN:   [id number]     Date/Time:  11/17/2022 10:33 AM    Esophagogastroduodenoscopy (EGD) Procedure Note    Procedure: Esophagogastroduodenoscopy with biopsy    Indication: Fe def anemia and GERD  Pre-operative Diagnosis: see indication above  Post-operative Diagnosis: see findings below  Operator:  Raynelle Highland, MD  Referring Provider:   Helayne Seminole., MD    Exam:  Airway: clear, no airway problems anticipated  Heart: RRR, without gallops or rubs  Lungs: clear bilaterally without wheezes, crackles, or rhonchi  Abdomen: soft, nontender, nondistended, bowel sounds present  Mental Status: awake, alert and oriented to person, place and time     Anethesia/Sedation:  MAC anesthesia Propofol as per colonoscopy  Procedure Details   After informed consent was obtained for the procedure, with all risks and benefits of procedure explained the patient was taken to the endoscopy suite and placed in the left lateral decubitus position.  Following sequential administration of sedation as per above, the GIFH180 gastroscope was inserted into the mouth and advanced under direct vision to second portion of the duodenum.  A careful inspection was made as the gastroscope was withdrawn, including a retroflexed view of the proximal stomach; findings and interventions are described below.                  Findings:    -Normal esophageal mucosa; biopsied to exclude active inflammation  -Small 3cm hiatal hernia from 40-43cm  -Normal stomach mucosa; biopsied to exclude inflammation  -Normal duodenal mucosa    Therapies:  biopsy of esophagus; biopsy of stomach   Specimens: #1 gastric; #2 gastroesophageal junction  EBL:  None.         Complications:   None; patient tolerated the procedure well.           Impression:    -Normal esophageal mucosa; biopsied to exclude active inflammation  -Small 3cm hiatal hernia from 40-43cm  -Normal stomach mucosa; biopsied to exclude inflammation  -Normal  duodenal mucosa    Recommendations:  -Continue acid suppression., -Await pathology., -Follow symptoms., -Proceed to colonoscopy    Discharge disposition:  Home in the company of driver when able to ambulate after colonoscopy    Raynelle Highland, MD

## 2022-11-17 NOTE — Anesthesia Post-Procedure Evaluation (Signed)
Department of Anesthesiology  Postprocedure Note    Patient: Damon [name]  MRN: [id number]  Birthdate: 12/01/1950  Date of evaluation: 11/17/2022    Procedure Summary       Date: 11/17/22 Room / Location: [facility] ENDO 01 / [facility]    Anesthesia Start: 0948 Anesthesia Stop: 1027    Procedures:       COLONOSCOPY, EGD      ESOPHAGOGASTRODUODENOSCOPY BIOPSY Diagnosis:       Iron deficiency anemia, unspecified iron deficiency anemia type      (Iron deficiency anemia, unspecified iron deficiency anemia type [D50.9])    Surgeons: Raynelle Highland, MD Responsible Provider: Charlyn Minerva, MD    Anesthesia Type: TIVA, MAC ASA Status: 2            Anesthesia Type: TIVA, MAC    Aldrete Phase I: Aldrete Score: 10    Aldrete Phase II: Aldrete Score: 10    Anesthesia Post Evaluation    Patient location during evaluation: PACU  Patient participation: complete - patient participated  Level of consciousness: awake  Airway patency: patent  Nausea & Vomiting: no vomiting  Cardiovascular status: hemodynamically stable  Respiratory status: acceptable  Hydration status: euvolemic    No notable events documented.

## 2022-11-17 NOTE — Anesthesia Pre-Procedure Evaluation (Signed)
Department of Anesthesiology  Preprocedure Note       Name:  Damon [name]   Age:  72 y.o.  DOB:  1951/02/20                                          MRN:  [id number]         Date:  11/17/2022      Surgeon: Juliann Mule):  Raynelle Highland, MD    Procedure: Procedure(s):  COLONOSCOPY, EGD  ESOPHAGOGASTRODUODENOSCOPY BIOPSY    Medications prior to admission:   Prior to Admission medications    Medication Sig Start Date End Date Taking? Authorizing Provider   omeprazole (PRILOSEC) 40 MG delayed release capsule TAKE 1 CAPSULE BY MOUTH ONCE DAILY FOR STOMACH 04/30/22   Helayne Seminole., MD   tamsulosin [facility]) 0.4 MG capsule Take 1 capsule by mouth daily 04/30/22   Helayne Seminole., MD   amLODIPine (NORVASC) 5 MG tablet Take 1 tablet by mouth once daily for blood pressure 04/30/22   Helayne Seminole., MD   lisinopril-hydroCHLOROthiazide [facility]) 20-25 MG per tablet Take 1 tablet by mouth daily 04/30/22   Helayne Seminole., MD   rosuvastatin (CRESTOR) 20 MG tablet Take 1 tablet by mouth nightly Take 1 tablet by mouth nightly 04/30/22   Helayne Seminole., MD   aspirin 81 MG EC tablet Take 1 tablet by mouth daily 02/23/19   Automatic Reconciliation, Ar       Current medications:    Current Facility-Administered Medications   Medication Dose Route Frequency Provider Last Rate Last Admin   . sodium chloride flush 0.9 % injection 5-40 mL  5-40 mL IntraVENous 2 times per day Raynelle Highland, MD       . sodium chloride flush 0.9 % injection 5-40 mL  5-40 mL IntraVENous PRN Raynelle Highland, MD       . 0.9 % sodium chloride infusion  25 mL IntraVENous PRN Raynelle Highland, MD           Allergies:  No Known Allergies    Problem List:    Patient Active Problem List   Diagnosis Code   . GI bleed K92.2   . GERD (gastroesophageal reflux disease) K21.9   . Hypertension I10   . Abdominal aortic aneurysm without rupture (HCC) I71.40   . Hyperlipidemia E78.5   . Chronic renal disease, stage III (HCC) N18.30       Past Medical History:         Diagnosis Date   . Aneurysm ([place]) 2016    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   . 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 Cancer [facility])     prostate   . GERD (gastroesophageal reflux disease)    . HTN (hypertension)    . Hyperlipidemia        Past Surgical History:        Procedure Laterality Date   . ANGIOPLASTY      iliac, prior to percutaneous Endovascular Aortic Repair (EVAR)   . ORTHOPEDIC SURGERY  04/2018    heel surgery   . VASCULAR SURGERY  06/18/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       Social History:    Social History     Tobacco Use   .  Smoking status: Former     Current packs/day: 0.50     Average packs/day: 0.5 packs/day for 54.9 years (27.5 ttl pk-yrs)     Types: Cigarettes     Start date: 12/11/1967   . Smokeless tobacco: Former     Quit date: 05/22/2015   Substance Use Topics   . Alcohol use: No     Alcohol/week: 0.0 standard drinks of alcohol                                Counseling given: Not Answered      Vital Signs (Current): There were no vitals filed for this visit.                                           BP Readings from Last 3 Encounters:   11/16/22 110/60   07/31/22 120/62   04/30/22 130/70       NPO Status:                                                                                 BMI:   Wt Readings from Last 3 Encounters:   11/16/22 88.9 kg (196 lb)   07/31/22 89.8 kg (198 lb)   04/30/22 90.3 kg (199 lb)     There is no height or weight on file to calculate BMI.    CBC:   Lab Results   Component Value Date/Time    WBC 5.1 11/16/2022 09:50 AM    RBC 5.19 11/16/2022 09:50 AM    HGB 13.1 11/16/2022 09:50 AM    HCT 40.4 11/16/2022 09:50 AM    MCV 77.8 11/16/2022 09:50 AM    RDW 17.2 11/16/2022 09:50 AM    PLT 377 11/16/2022 09:50 AM       CMP:   Lab Results   Component Value Date/Time    NA 137 11/16/2022 09:50 AM    K 4.6 11/16/2022 09:50 AM    CL 107 11/16/2022 09:50 AM    CO2 22 11/16/2022 09:50 AM    BUN 22  11/16/2022 09:50 AM    CREATININE 1.29 11/16/2022 09:50 AM    GFRAA 60 03/06/2021 09:57 AM    AGRATIO 0.8 11/16/2022 09:50 AM    AGRATIO 1.0 01/21/2022 12:14 PM    LABGLOM 59 11/16/2022 09:50 AM    GLUCOSE 98 11/16/2022 09:50 AM    PROT 7.9 11/16/2022 09:50 AM    CALCIUM 9.5 11/16/2022 09:50 AM    BILITOT 0.7 11/16/2022 09:50 AM    ALKPHOS 123 11/16/2022 09:50 AM    ALKPHOS 121 01/21/2022 12:14 PM    AST 9 11/16/2022 09:50 AM    ALT 13 11/16/2022 09:50 AM       POC Tests: No results for input(s): "POCGLU", "POCNA", "POCK", "POCCL", "POCBUN", "POCHEMO", "POCHCT" in the last 72 hours.    Coags: No results found for: "PROTIME", "INR", "APTT"    HCG (If Applicable): No results found for: "PREGTESTUR", "PREGSERUM", "HCG", "HCGQUANT"     ABGs: No  results found for: "PHART", "PO2ART", "PCO2ART", "HCO3ART", "BEART", "O2SATART"     Type & Screen (If Applicable):  No results found for: "LABABO", "[place]"    Drug/Infectious Status (If Applicable):  Lab Results   Component Value Date/Time    HEPCAB NONREACTIVE 08/28/2016 09:21 AM       COVID-19 Screening (If Applicable): No results found for: "COVID19"        Anesthesia Evaluation  Patient summary reviewed and Nursing notes reviewed  Airway: Mallampati: IV     Neck ROM: full  Mouth opening: > = 3 FB   Dental:          Pulmonary:Negative Pulmonary ROS and normal exam                               Cardiovascular:  Exercise tolerance: good (>4 METS)  (+) hypertension:, hyperlipidemia               ROS comment: Hx AAA       Neuro/Psych:   Negative Neuro/Psych ROS              GI/Hepatic/Renal:   (+) GERD:, renal disease: CRI          Endo/Other:    (+) malignancy/cancer.                  ROS comment: Hx prostate ca Abdominal:             Vascular: negative vascular ROS.         Other Findings:         Anesthesia Plan      TIVA     ASA 2       Induction: intravenous.  continuous noninvasive hemodynamic monitor    Anesthetic plan and risks discussed with patient.      Plan discussed  with CRNA.                  Leane Call, MD   11/17/2022

## 2022-11-17 NOTE — Discharge Instructions (Addendum)
Damon [name]  [id number]  [birth year]    EGD/COLON DISCHARGE INSTRUCTIONS  Discomfort:  Redness at IV site- apply warm compress to area; if redness or soreness persist- contact your physician  There may be a slight amount of blood passed from the rectum  Gaseous discomfort- walking, belching will help relieve any discomfort  You may not operate a vehicle for 12 hours  You may not engage in an occupation involving machinery or appliances for rest of today  You may not drink alcoholic beverages for at least 12 hours  Avoid making any critical decisions for at least 24 hour  DIET:   High fiber diet.   - however -  remember your colon is empty and a heavy meal will produce gas.   Avoid these foods:  vegetables, fried / greasy foods, carbonated drinks for today  MEDICATION:  @DISCHARGEMEDS$ @     ACTIVITY:  You may not resume your normal daily activities until tomorrow AM; it is recommended that you spend the remainder of the day resting -  avoid any strenuous activity.  CALL M.D.  ANY SIGN OF:   Increasing pain, nausea, vomiting  Abdominal distension (swelling)  New increased bleeding (oral or rectal)  Fever (chills)  Pain in chest area  Bloody discharge from nose or mouth  Shortness of breath    IMPRESSION:  -Normal esophageal mucosa; biopsied to exclude active inflammation  -Small 3cm hiatal hernia from 40-43cm  -Normal stomach mucosa; biopsied to exclude inflammation  -Normal duodenal mucosa  -Single diminutive benign-appearing 57m sessile polyp in the ascending colon at 85cm; removed with cold snare  -Three diminutive benign-appearing 2-332msessile polyps in the transverse colon from 75-80cm; removed with cold snare  -Small grade 1 internal hemorrhoids    Follow-up Instructions:   -Call Dr. FeLevi Alandor the results of procedure / biopsy in 7-10 days  -Telephone # [phone]-Await pathology.,   -If all four polyps are adenomas, repeat colonoscopy in 3 years. If the polyps are a mix of adenomas and hyperplastic polyps,  repeat colonoscopy in 5 years.     [name]    Patient Education on Sedation / Analgesia Administered for Procedure      For 24 hours after general anesthesia or intravenous analgesia / sedation:  Have someone responsible help you with your care  Limit your activities  Do not drive and operate hazardous machinery  Do not make important personal, legal or business decisions  Do not drink alcoholic beverages  If you have not urinated within 8 hours after discharge, please contact your physician  Resume your medications unless otherwise instructed    For 24 hours after general anesthesia or intravenous analgesia / sedation  you may experience:  Drowsiness, dizziness, sleepiness, or confusion  Difficulty remembering or delayed reaction times  Difficulty with your balance, especially while walking, move slowly and carefully, do not make sudden position changes  Difficulty focusing or blurred vision    You may not be aware of slight changes in your behavior and/or your reaction time because of the medication used during and after your procedure.    Report the following to your physician:  Excessive pain, swelling, redness or odor of or around the surgical area  Temperature over 100.5  Nausea and vomiting lasting longer than 4 hours or if unable to take medications  Any signs of decreased circulation or nerve impairment to extremity: change in color, persistent numbness, tingling, coldness or increase pain  Any questions or concerns  IF YOU REPORT TO AN EMERGENCY ROOM, DOCTOR'S OFFICE OR HOSPITAL WITHIN 24 HOURS AFTER YOUR PROCEDURE, BRING THIS SHEET AND YOUR AFTER VISIT SUMMARY WITH YOU AND GIVE IT TO THE PHYSICIAN OR NURSE ATTENDING YOU.

## 2022-11-17 NOTE — Op Note (Signed)
NAME:  Damon [name]   DOB:   1951-05-10   MRN:   [id number]     Date/Time:  11/17/2022 10:35 AM    Colonoscopy Operative Report    Procedure Type:   Colonoscopy with polypectomy (cold snare)     Indications:     Iron deficiency anemia  Pre-operative Diagnosis: see indication above  Post-operative Diagnosis:  See findings below  Operator:  Raynelle Highland, MD  Referring Provider: Helayne Seminole., MD    Exam:  Airway: clear, no airway problems anticipated  Heart: RRR, without gallops or rubs  Lungs: clear bilaterally without wheezes, crackles, or rhonchi  Abdomen: soft, nontender, nondistended, bowel sounds present  Mental Status: awake, alert and oriented to person, place and time    Sedation:  MAC anesthesia Propofol 289m Iv  Procedure Details:  After informed consent was obtained with all risks and benefits of procedure explained and preoperative exam completed, the patient was taken to the endoscopy suite and placed in the left lateral decubitus position.  Upon sequential sedation as per above, a digital rectal exam was performed demonstrating internal hemorrhoids.  The Olympus videocolonoscope  was inserted in the rectum and carefully advanced to the cecum, which was identified by the ileocecal valve and appendiceal orifice.   The quality of preparation was adequate.  The colonoscope was slowly withdrawn with careful evaluation between folds. Retroflexion in the rectum was completed demonstrating internal hemorrhoids.     Findings:     -Single diminutive benign-appearing 232msessile polyp in the ascending colon at 85cm; removed with cold snare  -Three diminutive benign-appearing 2-56m45messile polyps in the transverse colon from 75-80cm; removed with cold snare  -Small grade 1 internal hemorrhoids    Specimen Removed:  #3 transverse colon (3); #4 asc colon (1)  Complications: None.   EBL:  None.    Impression:    -Single diminutive benign-appearing 2mm6mssile polyp in the ascending colon at 85cm; removed  with cold snare  -Three diminutive benign-appearing 2-56mm 68msile polyps in the transverse colon from 75-80cm; removed with cold snare  -Small grade 1 internal hemorrhoids    Recommendations: --Await pathology., -If all four polys are adenomas, repeat colonoscopy in 3 years. If the polyps are a mix of adenomas and hyperplastic polys, repeat colonoscopy in 5 years. High fiber diet.  Resume normal medication(s).  You will receive a letter about the biopsy results in about 10 days.  You may be asked to call your doctor's office for the results.       Discharge Disposition:  Home in the company of a driver when able to ambulate.    Becki Mccaskill Raynelle Highland

## 2022-11-17 NOTE — Progress Notes (Signed)
Damon [name]  30-Apr-1951  [id number]    Situation:  Verbal report received from: Peyton,RN  Procedure: Procedure(s):  COLONOSCOPY, EGD  ESOPHAGOGASTRODUODENOSCOPY BIOPSY    Background:    Preoperative diagnosis: Iron deficiency anemia, unspecified iron deficiency anemia type [D50.9]  Postoperative diagnosis: * No post-op diagnosis entered *    Operator:  Dr. Levi Aland  Assistant(s): Circulator: Gevena Cotton, RN  Endoscopy Technician: Beverly Gust    Vital signs stable   Abdominal assessment: round and soft       Patient returned to baseline, vital signs stable (see vital sign flowsheet). Patient offered liquids and tolerated well. Respiratory status within defined limits. Abdomen soft not tender. Skin with in defined limits. Responsible party driving patient home was given the opportunity to ask questions. Patient discharged with documented belongings.

## 2022-11-17 NOTE — H&P (Signed)
Gastroenterology Outpatient History and Physical    Patient: Damon [name]    Physician: Raynelle Highland, MD    Chief Complaint: Fe def anemia and GERD  History of Present Illness: 72yo M with chronic GERD sx and drop in Hgb felt c/w Fe def anemia.  No prior EGD or colonoscopy.  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     Cancer ([place])     prostate    GERD (gastroesophageal reflux disease)     HTN (hypertension)     Hyperlipidemia       Past Surgical History:   Procedure Laterality Date    ANGIOPLASTY      iliac, prior to percutaneous Endovascular Aortic Repair (EVAR)    ORTHOPEDIC SURGERY  04/2018    heel surgery    VASCULAR SURGERY  06/18/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      Social History     Socioeconomic History    Marital status: Single     Spouse name: None    Number of children: None    Years of education: None    Highest education level: None   Tobacco Use    Smoking status: Former     Current packs/day: 0.50     Average packs/day: 0.5 packs/day for 54.9 years (27.5 ttl pk-yrs)     Types: Cigarettes     Start date: 12/11/1967    Smokeless tobacco: Former     Quit date: 05/22/2015   Vaping Use    Vaping Use: Never used   Substance and Sexual Activity    Alcohol use: No     Alcohol/week: 0.0 standard drinks of alcohol    Drug use: No    Sexual activity: Defer     Social Determinants of Health     Financial Resource Strain: Low Risk  (11/16/2022)    Overall Financial Resource Strain (CARDIA)     Difficulty of Paying Living Expenses: Not very hard   Food Insecurity: No Food Insecurity (11/16/2022)    Hunger Vital Sign     Worried About Running Out of Food in the Last Year: Never true     Ran Out of Food in the Last Year: Never true   Transportation Needs: Unknown (11/16/2022)    PRAPARE - Transportation     Lack  of Transportation (Non-Medical): No   Physical Activity: Insufficiently Active (11/16/2022)    Exercise Vital Sign     Days of Exercise per Week: 2 days     Minutes of Exercise per Session: 30 min   Housing Stability: Unknown (11/16/2022)    Housing Stability Vital Sign     Unstable Housing in the Last Year: No      Family History   Problem Relation Age of Onset    Diabetes Father     Heart Disease Father     Hypertension Father     Diabetes Paternal Uncle         deceased, 90's, heart trouble    Heart Disease Sister     Hypertension Sister     Heart Disease Brother     Hypertension Brother     Kidney Disease Sister     Hypertension Sister     Hypertension  Mother     Kidney Disease Sister         on dialysis      Patient Active Problem List   Diagnosis    GI bleed    GERD (gastroesophageal reflux disease)    Hypertension    Abdominal aortic aneurysm without rupture (HCC)    Hyperlipidemia    Chronic renal disease, stage III (HCC)       Allergies: No Known Allergies  Medications:   Prior to Admission medications    Medication Sig Start Date End Date Taking? Authorizing Provider   omeprazole (PRILOSEC) 40 MG delayed release capsule TAKE 1 CAPSULE BY MOUTH ONCE DAILY FOR STOMACH 04/30/22   Helayne Seminole., MD   tamsulosin [facility]) 0.4 MG capsule Take 1 capsule by mouth daily 04/30/22   Helayne Seminole., MD   amLODIPine (NORVASC) 5 MG tablet Take 1 tablet by mouth once daily for blood pressure 04/30/22   Helayne Seminole., MD   lisinopril-hydroCHLOROthiazide [facility]) 20-25 MG per tablet Take 1 tablet by mouth daily 04/30/22   Helayne Seminole., MD   rosuvastatin (CRESTOR) 20 MG tablet Take 1 tablet by mouth nightly Take 1 tablet by mouth nightly 04/30/22   Helayne Seminole., MD   aspirin 81 MG EC tablet Take 1 tablet by mouth daily 02/23/19   Automatic Reconciliation, Ar     Physical Exam:   Vital Signs: Blood pressure (!) 160/65, pulse 67, temperature 98 F (36.7 C), temperature source Oral, resp.  rate 17, height 1.778 m (5' 10"$ ), weight 88.9 kg (196 lb), SpO2 98 %.  General: well developed, well nourished   HEENT: unremarkable   Heart: regular rhythm no mumur    Lungs: clear   Abdominal:  benign   Neurological: unremarkable   Extremities: no edema     Findings/Diagnosis: Fe def anemia and GERD  Plan of Care/Planned Procedure: EGD/Colonoscopy with conscious/deep sedation    Signed:  Raynelle Highland, MD 11/17/2022

## 2022-11-17 NOTE — Other (Signed)
See anesthesia note and MAR for medications given during procedure.   Endoscope was pre-cleaned at the bedside immediately following procedure by Herbie Murray, ET

## 2023-02-16 ENCOUNTER — Encounter

## 2023-02-16 ENCOUNTER — Ambulatory Visit: Admit: 2023-02-16 | Discharge: 2023-02-16 | Payer: MEDICARE | Attending: Internal Medicine | Primary: Internal Medicine

## 2023-02-16 DIAGNOSIS — I10 Essential (primary) hypertension: Secondary | ICD-10-CM

## 2023-02-16 LAB — COMPREHENSIVE METABOLIC PANEL
ALT: 14 U/L (ref 12–78)
AST: 15 U/L (ref 15–37)
Albumin/Globulin Ratio: 0.9 — ABNORMAL LOW (ref 1.1–2.2)
Albumin: 3.7 g/dL (ref 3.5–5.0)
Alk Phosphatase: 137 U/L — ABNORMAL HIGH (ref 45–117)
Anion Gap: 6 mmol/L (ref 5–15)
BUN/Creatinine Ratio: 15 (ref 12–20)
BUN: 21 MG/DL — ABNORMAL HIGH (ref 6–20)
CO2: 25 mmol/L (ref 21–32)
Calcium: 10.1 MG/DL (ref 8.5–10.1)
Chloride: 106 mmol/L (ref 97–108)
Creatinine: 1.39 MG/DL — ABNORMAL HIGH (ref 0.70–1.30)
Est, Glom Filt Rate: 54 mL/min/{1.73_m2} — ABNORMAL LOW (ref >60)
Globulin: 4.2 g/dL — ABNORMAL HIGH (ref 2.0–4.0)
Glucose: 115 mg/dL — ABNORMAL HIGH (ref 65–100)
Potassium: 4.9 mmol/L (ref 3.5–5.1)
Sodium: 137 mmol/L (ref 136–145)
Total Bilirubin: 0.5 MG/DL (ref 0.2–1.0)
Total Protein: 7.9 g/dL (ref 6.4–8.2)

## 2023-02-16 LAB — CBC
Hematocrit: 45.5 % (ref 36.6–50.3)
Hemoglobin: 14.5 g/dL (ref 12.1–17.0)
MCH: 25.3 PG — ABNORMAL LOW (ref 26.0–34.0)
MCHC: 31.9 g/dL (ref 30.0–36.5)
MCV: 79.4 FL — ABNORMAL LOW (ref 80.0–99.0)
MPV: 9.5 FL (ref 8.9–12.9)
Nucleated RBCs: 0 PER 100 WBC
Platelets: 377 10*3/uL (ref 150–400)
RBC: 5.73 M/uL — ABNORMAL HIGH (ref 4.10–5.70)
RDW: 16.7 % — ABNORMAL HIGH (ref 11.5–14.5)
WBC: 5.7 10*3/uL (ref 4.1–11.1)
nRBC: 0 10*3/uL (ref 0.00–0.01)

## 2023-02-16 LAB — LIPID PANEL
Chol/HDL Ratio: 4.1 (ref 0.0–5.0)
Cholesterol, Total: 158 MG/DL (ref <200)
HDL: 39 MG/DL
LDL Cholesterol: 98.6 MG/DL (ref 0–100)
Triglycerides: 102 MG/DL (ref <150)
VLDL Cholesterol Calculated: 20.4 MG/DL

## 2023-02-16 NOTE — Progress Notes (Signed)
Damon [name] is a 72 y.o. male and presents with Diabetes, Hypertension, and Cholesterol Problem  .  Subjective:  Hypertension Review:  The patient has hypertension .  Diet and Lifestyle: generally follows a low sodium diet, exercises sporadically  Home BP Monitoring: is not measured at home.  Pertinent ROS: taking medications as instructed, no medication side effects noted, no TIA's, no chest pain on exertion, no dyspnea on exertion, no swelling of ankles.    Dyslipidemia Review:  Patient presents for evaluation of lipids.  Compliance with treatment thus far has been excellent.  A repeat fasting lipid profile was done.  The patient does not use medications that may worsen dyslipidemias . The patient exercises sporadically.    He has a longstanding history of prostate cancer and offers no new complaints today.  His most recent PSA is 0.4  He was treated with radiation.      GERD Review:   Patient has a history of gastroesophageal reflux with heartburn. Symptoms have been present for a few months.  He denies dysphagia.  He  has not lost weight.  He denies melena, hematochezia, hematemesis, and coffee ground emesis.  This has been associated with fullness after meals.  He denies abdominal bloating and none.  Medical therapy in the past has included proton pump inhibitor      Review of Systems  Constitutional: negative for fevers, chills, anorexia and weight loss  Eyes:   negative for visual disturbance and irritation  ENT:   negative for tinnitus,sore throat,nasal congestion,ear pains.hoarseness  Respiratory:  negative for cough, hemoptysis, dyspnea,wheezing  CV:   negative for chest pain, palpitations, lower extremity edema  GI:   negative for nausea, vomiting, diarrhea, abdominal pain,melena  Endo:               negative for polyuria,polydipsia,polyphagia,heat intolerance  Genitourinary: negative for frequency, dysuria and hematuria  Integument:  negative for rash and pruritus  Hematologic:  negative for easy  bruising and gum/nose bleeding  Musculoskel: negative for myalgias, arthralgias, back pain, muscle weakness, joint pain  Neurological:  negative for headaches, dizziness, vertigo, memory problems and gait   Behavl/Psych: negative for feelings of anxiety, depression, mood changes    Past Medical History:   Diagnosis Date    Aneurysm (HCC) 2016    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    Aneurysm (HCC) 2016    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     Cancer (HCC)     prostate    GERD (gastroesophageal reflux disease)     HTN (hypertension)     Hyperlipidemia      Past Surgical History:   Procedure Laterality Date    ANGIOPLASTY      iliac, prior to percutaneous Endovascular Aortic Repair (EVAR)    COLONOSCOPY N/A 11/17/2022    COLONOSCOPY, EGD performed by Jeral Fruit, MD at [facility]    ORTHOPEDIC SURGERY  04/2018    heel surgery    UPPER GASTROINTESTINAL ENDOSCOPY N/A 11/17/2022    ESOPHAGOGASTRODUODENOSCOPY BIOPSY performed by Jeral Fruit, MD at [facility]    VASCULAR SURGERY  06/18/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     Social History     Socioeconomic History    Marital status: Single     Spouse name: None    Number of children: None    Years of education: None    Highest education level: None   Tobacco Use    Smoking status:  Former     Current packs/day: 0.50     Average packs/day: 0.5 packs/day for 55.2 years (27.6 ttl pk-yrs)     Types: Cigarettes     Start date: 12/11/1967    Smokeless tobacco: Former     Quit date: 05/22/2015   Vaping Use    Vaping Use: Never used   Substance and Sexual Activity    Alcohol use: No     Alcohol/week: 0.0 standard drinks of alcohol    Drug use: No    Sexual activity: Defer     Social Determinants of Health     Financial Resource Strain: Low Risk  (02/16/2023)    Overall Financial Resource Strain (CARDIA)     Difficulty of Paying Living Expenses: Not hard at all   Food Insecurity: No Food Insecurity (02/16/2023)    Hunger Vital Sign      Worried About Running Out of Food in the Last Year: Never true     Ran Out of Food in the Last Year: Never true   Transportation Needs: Unknown (02/16/2023)    PRAPARE - Transportation     Lack of Transportation (Non-Medical): No   Physical Activity: Insufficiently Active (11/16/2022)    Exercise Vital Sign     Days of Exercise per Week: 2 days     Minutes of Exercise per Session: 30 min   Housing Stability: Unknown (02/16/2023)    Housing Stability Vital Sign     Unstable Housing in the Last Year: No     Family History   Problem Relation Age of Onset    Diabetes Father     Heart Disease Father     Hypertension Father     Diabetes Paternal Uncle         deceased, 62's, heart trouble    Heart Disease Sister     Hypertension Sister     Heart Disease Brother     Hypertension Brother     Kidney Disease Sister     Hypertension Sister     Hypertension Mother     Kidney Disease Sister         on dialysis     Current Outpatient Medications   Medication Sig Dispense Refill    omeprazole (PRILOSEC) 40 MG delayed release capsule TAKE 1 CAPSULE BY MOUTH ONCE DAILY FOR STOMACH 90 capsule 3    tamsulosin (FLOMAX) 0.4 MG capsule Take 1 capsule by mouth daily 90 capsule 3    amLODIPine (NORVASC) 5 MG tablet Take 1 tablet by mouth once daily for blood pressure 90 tablet 3    lisinopril-hydroCHLOROthiazide (PRINZIDE;ZESTORETIC) 20-25 MG per tablet Take 1 tablet by mouth daily 90 tablet 3    rosuvastatin (CRESTOR) 20 MG tablet Take 1 tablet by mouth nightly Take 1 tablet by mouth nightly 90 tablet 3    aspirin 81 MG EC tablet Take 1 tablet by mouth daily       No current facility-administered medications for this visit.     No Known Allergies    Objective:  BP 110/70   Pulse 61   Temp 98 F (36.7 C) (Oral)   Resp 16   Ht 1.778 m (5\' 10" )   Wt 92.1 kg (203 lb)   SpO2 96%   BMI 29.13 kg/m   Physical Exam:   General appearance - alert, well appearing, and in no distress  Mental status - alert, oriented to person, place, and  time  EYE-PERLA, EOMI, corneas normal, no foreign bodies  ENT-ENT exam normal, no neck nodes or sinus tenderness  Nose - normal and patent, no erythema, discharge or polyps  Mouth - mucous membranes moist, pharynx normal without lesions  Neck - supple, no significant adenopathy   Chest - clear to auscultation, no wheezes, rales or rhonchi, symmetric air entry   Heart - normal rate, regular rhythm, normal S1, S2, no murmurs, rubs, clicks or gallops   Abdomen - soft, nontender, nondistended, no masses or organomegaly  Lymph- no adenopathy palpable  Ext-peripheral pulses normal, no pedal edema, no clubbing or cyanosis  Skin-Warm and dry. no hyperpigmentation, vitiligo, or suspicious lesions  Neuro -alert, oriented, normal speech, no focal findings or movement disorder noted  Neck-normal C-spine, no tenderness, full ROM without pain  Feet-no nail deformities or callus formation with good pulses noted      No results found for this visit on 02/16/23.    Assessment/Plan:    ICD-10-CM    1. Essential (primary) hypertension  I10       2. Pure hypercholesterolemia, unspecified  E78.00       3. Gastro-esophageal reflux disease without esophagitis  K21.9       4. Malignant neoplasm of prostate (HCC)  C61       5. Erectile disorder  N52.9         No orders of the defined types were placed in this encounter.    follow low fat diet, follow low salt diet, call if any problems,  Patient Instructions   Thank you for enrolling in MyChart. Please follow the instructions below to securely access your online medical record. MyChart allows you to send messages to your doctor, view your test results, renew your prescriptions, schedule appointments, and more.     How Do I Sign Up?  In your Internet browser, go to [email or web address]  Click on the Sign Up Now link in the Sign In box. You will see the New Member Sign Up page.  Enter your MyChart Access Code exactly as it appears below. You will not need to use  this code after you've completed the sign-up process. If you do not sign up before the expiration date, you must request a new code.  MyChart Access Code: Activation code not generated  Current MyChart Status: Patient Declined    Enter your Social Security Number ([id number]) and Date of Birth (mm/dd/yyyy) as indicated and click Submit. You will be taken to the next sign-up page.  Create a MyChart ID. This will be your MyChart login ID and cannot be changed, so think of one that is secure and easy to remember.  Create a Clinical biochemist. You can change your password at any time.  Enter your Password Reset Question and Answer. This can be used at a later time if you forget your password.   Enter your e-mail address. You will receive e-mail notification when new information is available in MyChart.  Click Sign Up. You can now view your medical record.     Additional Information  If you have questions, please contact your physician practice where you receive care. Remember, MyChart is NOT to be used for urgent needs. For medical emergencies, dial 911.     Follow-up and Dispositions    Return in about 3 months (around 05/19/2023).           I have reviewed with the patient details of the assessment and plan and all questions were answered. Relevent patient education was performed.The most recent lab findings were reviewed  with the patient.    An After Visit Summary was printed and given to the patient.

## 2023-02-16 NOTE — Progress Notes (Signed)
1. Have you been to the ER, urgent care clinic since your last visit?  Hospitalized since your last visit?No    2. Have you seen or consulted any other health care providers outside of the [facility] since your last visit?  Include any pap smears or colon screening. No     Chief Complaint   Patient presents with    Diabetes    Hypertension    Cholesterol Problem         PHQ-9 Total Score: 0 (02/16/2023  9:40 AM)

## 2023-02-16 NOTE — Patient Instructions (Signed)
Thank you for enrolling in MyChart. Please follow the instructions below to securely access your online medical record. MyChart allows you to send messages to your doctor, view your test results, renew your prescriptions, schedule appointments, and more.     How Do I Sign Up?  In your Internet browser, go to [email or web address]  Click on the Sign Up Now link in the Sign In box. You will see the New Member Sign Up page.  Enter your MyChart Access Code exactly as it appears below. You will not need to use this code after you've completed the sign-up process. If you do not sign up before the expiration date, you must request a new code.  MyChart Access Code: Activation code not generated  Current MyChart Status: Patient Declined    Enter your Social Security Number ([id number]) and Date of Birth (mm/dd/yyyy) as indicated and click Submit. You will be taken to the next sign-up page.  Create a MyChart ID. This will be your MyChart login ID and cannot be changed, so think of one that is secure and easy to remember.  Create a MyChart password. You can change your password at any time.  Enter your Password Reset Question and Answer. This can be used at a later time if you forget your password.   Enter your e-mail address. You will receive e-mail notification when new information is available in MyChart.  Click Sign Up. You can now view your medical record.     Additional Information  If you have questions, please contact your physician practice where you receive care. Remember, MyChart is NOT to be used for urgent needs. For medical emergencies, dial 911.

## 2023-03-27 MED ORDER — LISINOPRIL-HYDROCHLOROTHIAZIDE 20-25 MG PO TABS
20-25 | ORAL_TABLET | Freq: Every day | ORAL | 0 refills | Status: AC
Start: 2023-03-27 — End: ?

## 2023-04-20 MED ORDER — AMLODIPINE BESYLATE 5 MG PO TABS
5 | ORAL_TABLET | ORAL | 3 refills | Status: DC
Start: 2023-04-20 — End: 2024-05-16

## 2023-05-19 ENCOUNTER — Ambulatory Visit: Admit: 2023-05-19 | Discharge: 2023-05-19 | Payer: MEDICARE | Attending: Internal Medicine | Primary: Internal Medicine

## 2023-05-19 ENCOUNTER — Encounter

## 2023-05-19 VITALS — BP 94/64 | HR 78 | Temp 98.00000°F | Resp 16 | Ht 70.0 in | Wt 203.0 lb

## 2023-05-19 DIAGNOSIS — I10 Essential (primary) hypertension: Secondary | ICD-10-CM

## 2023-05-19 MED ORDER — ROSUVASTATIN CALCIUM 20 MG PO TABS
20 | ORAL_TABLET | Freq: Every evening | ORAL | 3 refills | Status: DC
Start: 2023-05-19 — End: 2024-05-24

## 2023-05-19 MED ORDER — LISINOPRIL-HYDROCHLOROTHIAZIDE 20-25 MG PO TABS
20-25 | ORAL_TABLET | Freq: Every day | ORAL | 0 refills | Status: DC
Start: 2023-05-19 — End: 2023-08-17

## 2023-05-19 MED ORDER — TAMSULOSIN HCL 0.4 MG PO CAPS
0.4 | ORAL_CAPSULE | Freq: Every day | ORAL | 3 refills | Status: DC
Start: 2023-05-19 — End: 2024-05-24

## 2023-05-19 NOTE — Progress Notes (Signed)
 Damon [name] is a 72 y.o. male and presents with Cholesterol Problem, Gastroesophageal Reflux, and Hypertension  .  Subjective:  Hypertension Review:  The patient has hypertension .  Diet and Lifestyle: generally follows a low sodium diet, exercises sporadically  Home BP Monitoring: is not measured at home.  Pertinent ROS: taking medications as instructed, no medication side effects noted, no TIA's, no chest pain on exertion, no dyspnea on exertion, no swelling of ankles.    Dyslipidemia Review:  Patient presents for evaluation of lipids.  Compliance with treatment thus far has been excellent.  A repeat fasting lipid profile was done.  The patient does not use medications that may worsen dyslipidemias . The patient exercises sporadically.    GERD Review:   Patient has a history of gastroesophageal reflux with heartburn. Symptoms have been present for a few months.  He denies dysphagia.  He  has not lost weight.  He denies melena, hematochezia, hematemesis, and coffee ground emesis.  This has been associated with fullness after meals.  He denies abdominal bloating and none.  Medical therapy in the past has included proton pump inhibitor    BPH Review:  He has no burning on urination,dysuria or dribbling reported.He has no frequency on urination.  He has been on Flomax  and tolerating it well.        Review of Systems  Constitutional: negative for fevers, chills, anorexia and weight loss  Eyes:   negative for visual disturbance and irritation  ENT:   negative for tinnitus,sore throat,nasal congestion,ear pains.hoarseness  Respiratory:  negative for cough, hemoptysis, dyspnea,wheezing  CV:   negative for chest pain, palpitations, lower extremity edema  GI:   negative for nausea, vomiting, diarrhea, abdominal pain,melena  Endo:               negative for polyuria,polydipsia,polyphagia,heat intolerance  Genitourinary: negative for frequency, dysuria and hematuria  Integument:  negative for rash and  pruritus  Hematologic:  negative for easy bruising and gum/nose bleeding  Musculoskel: negative for myalgias, arthralgias, back pain, muscle weakness, joint pain  Neurological:  negative for headaches, dizziness, vertigo, memory problems and gait   Behavl/Psych: negative for feelings of anxiety, depression, mood changes    Past Medical History:   Diagnosis Date    Aneurysm (HCC) 2016    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    Aneurysm (HCC) 2016    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     Cancer (HCC)     prostate    GERD (gastroesophageal reflux disease)     HTN (hypertension)     Hyperlipidemia      Past Surgical History:   Procedure Laterality Date    ANGIOPLASTY      iliac, prior to percutaneous Endovascular Aortic Repair (EVAR)    COLONOSCOPY N/A 11/17/2022    COLONOSCOPY, EGD performed by Rosanna Gails, MD at [facility]    ORTHOPEDIC SURGERY  04/2018    heel surgery    UPPER GASTROINTESTINAL ENDOSCOPY N/A 11/17/2022    ESOPHAGOGASTRODUODENOSCOPY BIOPSY performed by Rosanna Gails, MD at [facility]    VASCULAR SURGERY  06/18/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     Social History     Socioeconomic History    Marital status: Single     Spouse name: None    Number of children: None    Years of education: None    Highest education level: None   Tobacco Use    Smoking  status: Former     Current packs/day: 0.50     Average packs/day: 0.5 packs/day for 55.4 years (27.7 ttl pk-yrs)     Types: Cigarettes     Start date: 12/11/1967    Smokeless tobacco: Former     Quit date: 05/22/2015   Vaping Use    Vaping status: Never Used   Substance and Sexual Activity    Alcohol use: No     Alcohol/week: 0.0 standard drinks of alcohol    Drug use: No    Sexual activity: Defer     Social Determinants of Health     Financial Resource Strain: Low Risk  (05/19/2023)    Overall Financial Resource Strain (CARDIA)     Difficulty of Paying Living Expenses: Not hard at all   Food Insecurity: No Food  Insecurity (05/19/2023)    Hunger Vital Sign     Worried About Running Out of Food in the Last Year: Never true     Ran Out of Food in the Last Year: Never true   Transportation Needs: Unknown (05/19/2023)    PRAPARE - Transportation     Lack of Transportation (Non-Medical): No   Physical Activity: Insufficiently Active (11/16/2022)    Exercise Vital Sign     Days of Exercise per Week: 2 days     Minutes of Exercise per Session: 30 min   Housing Stability: Unknown (05/19/2023)    Housing Stability Vital Sign     Homeless in the Last Year: No     Family History   Problem Relation Age of Onset    Diabetes Father     Heart Disease Father     Hypertension Father     Diabetes Paternal Uncle         deceased, 28's, heart trouble    Heart Disease Sister     Hypertension Sister     Heart Disease Brother     Hypertension Brother     Kidney Disease Sister     Hypertension Sister     Hypertension Mother     Kidney Disease Sister         on dialysis     Current Outpatient Medications   Medication Sig Dispense Refill    lisinopril -hydroCHLOROthiazide  (PRINZIDE ;ZESTORETIC ) 20-25 MG per tablet Take 1 tablet by mouth daily 90 tablet 0    tamsulosin  (FLOMAX ) 0.4 MG capsule Take 1 capsule by mouth daily 90 capsule 3    rosuvastatin  (CRESTOR ) 20 MG tablet Take 1 tablet by mouth nightly Take 1 tablet by mouth nightly 90 tablet 3    NONFORMULARY       amLODIPine  (NORVASC ) 5 MG tablet Take 1 tablet by mouth once daily for blood pressure 90 tablet 3    omeprazole  (PRILOSEC) 40 MG delayed release capsule TAKE 1 CAPSULE BY MOUTH ONCE DAILY FOR STOMACH 90 capsule 3    aspirin 81 MG EC tablet Take 1 tablet by mouth daily       No current facility-administered medications for this visit.     No Known Allergies    Objective:  BP 94/64   Pulse 78   Temp 98 F (36.7 C) (Oral)   Resp 16   Ht 1.778 m (5' 10)   Wt 92.1 kg (203 lb)   SpO2 94%   BMI 29.13 kg/m   Physical Exam:   General appearance - alert, well appearing, and in no  distress  Mental status - alert, oriented to person, place, and time  EYE-PERLA,  EOMI, corneas normal, no foreign bodies  ENT-ENT exam normal, no neck nodes or sinus tenderness  Nose - normal and patent, no erythema, discharge or polyps  Mouth - mucous membranes moist, pharynx normal without lesions  Neck - supple, no significant adenopathy   Chest - clear to auscultation, no wheezes, rales or rhonchi, symmetric air entry   Heart - normal rate, regular rhythm, normal S1, S2, no murmurs, rubs, clicks or gallops   Abdomen - soft, nontender, nondistended, no masses or organomegaly  Lymph- no adenopathy palpable  Ext-peripheral pulses normal, no pedal edema, no clubbing or cyanosis  Skin-Warm and dry. no hyperpigmentation, vitiligo, or suspicious lesions  Neuro -alert, oriented, normal speech, no focal findings or movement disorder noted  Neck-normal C-spine, no tenderness, full ROM without pain  Feet-no nail deformities or callus formation with good pulses noted      No results found for this visit on 05/19/23.    Assessment/Plan:    ICD-10-CM    1. Essential (primary) hypertension  I10       2. Peripheral vascular disease, unspecified (HCC)  I73.9       3. Stage 3a chronic kidney disease (HCC)  N18.31       4. Pure hypercholesterolemia, unspecified  E78.00       5. Gastro-esophageal reflux disease without esophagitis  K21.9         No orders of the defined types were placed in this encounter.    increase physical activity, follow low fat diet, follow low salt diet, call if any problems,  There are no Patient Instructions on file for this visit.   Follow-up and Dispositions    Return in about 3 months (around 08/19/2023), or if symptoms worsen or fail to improve.           I have reviewed with the patient details of the assessment and plan and all questions were answered. Relevent patient education was performed.The most recent lab findings were reviewed with the patient.    An After Visit Summary was printed and given  to the patient.

## 2023-05-19 NOTE — Progress Notes (Signed)
 1. Have you been to the ER, urgent care clinic since your last visit?  Hospitalized since your last visit?No    2. Have you seen or consulted any other health care providers outside of the [facility] since your last visit?  Include any pap smears or colon screening. No     Chief Complaint   Patient presents with    Cholesterol Problem    Gastroesophageal Reflux    Hypertension         PHQ-9 Total Score: 0 (05/19/2023  9:49 AM)

## 2023-05-19 NOTE — Addendum Note (Signed)
Addended by: Bronson Ing on: 05/19/2023 10:28 AM     Modules accepted: Orders

## 2023-05-20 LAB — COMPREHENSIVE METABOLIC PANEL
ALT: 17 U/L (ref 12–78)
AST: 13 U/L — ABNORMAL LOW (ref 15–37)
Albumin/Globulin Ratio: 1.1 (ref 1.1–2.2)
Albumin: 4.1 g/dL (ref 3.5–5.0)
Alk Phosphatase: 131 U/L — ABNORMAL HIGH (ref 45–117)
Anion Gap: 7 mmol/L (ref 5–15)
BUN/Creatinine Ratio: 13 (ref 12–20)
BUN: 21 mg/dL — ABNORMAL HIGH (ref 6–20)
CO2: 26 mmol/L (ref 21–32)
Calcium: 9.9 mg/dL (ref 8.5–10.1)
Chloride: 103 mmol/L (ref 97–108)
Creatinine: 1.63 mg/dL — ABNORMAL HIGH (ref 0.70–1.30)
Est, Glom Filt Rate: 44 mL/min/{1.73_m2} — ABNORMAL LOW (ref >60)
Globulin: 3.9 g/dL (ref 2.0–4.0)
Glucose: 107 mg/dL — ABNORMAL HIGH (ref 65–100)
Potassium: 4.3 mmol/L (ref 3.5–5.1)
Sodium: 136 mmol/L (ref 136–145)
Total Bilirubin: 1.1 mg/dL — ABNORMAL HIGH (ref 0.2–1.0)
Total Protein: 8 g/dL (ref 6.4–8.2)

## 2023-05-20 LAB — LIPID PANEL
Chol/HDL Ratio: 4.1 (ref 0.0–5.0)
Cholesterol, Total: 158 mg/dL (ref <200)
HDL: 39 mg/dL
LDL Cholesterol: 90.4 mg/dL (ref 0–100)
Triglycerides: 143 mg/dL (ref <150)
VLDL Cholesterol Calculated: 28.6 mg/dL

## 2023-05-20 LAB — CBC
Hematocrit: 46.3 % (ref 36.6–50.3)
Hemoglobin: 15.1 g/dL (ref 12.1–17.0)
MCH: 25.6 pg — ABNORMAL LOW (ref 26.0–34.0)
MCHC: 32.6 g/dL (ref 30.0–36.5)
MCV: 78.5 FL — ABNORMAL LOW (ref 80.0–99.0)
MPV: 9.4 FL (ref 8.9–12.9)
Nucleated RBCs: 0 /100{WBCs}
Platelets: 334 10*3/uL (ref 150–400)
RBC: 5.9 M/uL — ABNORMAL HIGH (ref 4.10–5.70)
RDW: 16.6 % — ABNORMAL HIGH (ref 11.5–14.5)
WBC: 5.5 10*3/uL (ref 4.1–11.1)
nRBC: 0 10*3/uL (ref 0.00–0.01)

## 2023-05-25 NOTE — Telephone Encounter (Signed)
 Pt called stated that, he noticed on his AVS, that he has Stage 3 Chronic Kidney Disease.  Pt stated that he does not have this and would like to know why it is on his chart?  Please call pt @ [phone].

## 2023-06-23 MED ORDER — OMEPRAZOLE 40 MG PO CPDR
40 | ORAL_CAPSULE | ORAL | 0 refills | Status: DC
Start: 2023-06-23 — End: 2023-07-16

## 2023-07-13 NOTE — Telephone Encounter (Signed)
Duplicate request:   06/23/2023 Prilosec 40 mg #90 was sent to [facility] in [place].     For Pharmacy Admin Tracking Only    Program: Medication Refill  Intervention Detail: Discontinued Rx: 1, reason: Duplicate Therapy  Time Spent (min): 5    Requested Prescriptions     Pending Prescriptions Disp Refills    omeprazole (PRILOSEC) 40 MG delayed release capsule [Pharmacy Med Name: OMEPRAZOLE DR 40MG   CAP] 90 capsule 0     Sig: TAKE 1 CAPSULE BY MOUTH ONCE DAILY FOR STOMACH

## 2023-07-16 MED ORDER — OMEPRAZOLE 40 MG PO CPDR
40 | ORAL_CAPSULE | ORAL | 0 refills | Status: AC
Start: 2023-07-16 — End: ?

## 2023-08-16 NOTE — Telephone Encounter (Signed)
 Last appointment: 05/19/2023 MD Chilton Si   Next appointment: 08/25/2023 MD Chilton Si   Previous refill encounter(s):   05/19/2023 Prinzide #90     For Pharmacy Admin Tracking Only    Program: Medication Refill  Intervention Detail: New Rx: 1, reason: Patient Pref

## 2023-08-17 MED ORDER — LISINOPRIL-HYDROCHLOROTHIAZIDE 20-25 MG PO TABS
20-25 MG | ORAL_TABLET | Freq: Every day | ORAL | 0 refills | Status: AC
Start: 2023-08-17 — End: 2023-11-20

## 2023-08-25 ENCOUNTER — Ambulatory Visit: Admit: 2023-08-25 | Payer: MEDICARE | Admitting: Internal Medicine | Primary: Internal Medicine

## 2023-08-25 VITALS — BP 110/66 | HR 61 | Temp 98.00000°F | Resp 16 | Ht 70.0 in | Wt 210.0 lb

## 2023-08-25 DIAGNOSIS — I10 Essential (primary) hypertension: Secondary | ICD-10-CM

## 2023-08-25 NOTE — Progress Notes (Signed)
 1. Have you been to the ER, urgent care clinic since your last visit?  Hospitalized since your last visit?No    2. Have you seen or consulted any other health care providers outside of the [facility] since your last visit?  Include any pap s

## 2023-08-25 NOTE — Progress Notes (Signed)
 Damon [name] is a 72 y.o. male and presents with Cholesterol Problem, Gastroesophageal Reflux, and Hypertension  .  Subjective:  Hypertension Review:  The patient has essential hypertension  Diet and Lifestyle: generally follows a  low sodium diet, exe

## 2023-08-27 LAB — CBC
Hematocrit: 47.7 % (ref 36.6–50.3)
Hemoglobin: 15.3 g/dL (ref 12.1–17.0)
MCH: 26.8 pg (ref 26.0–34.0)
MCHC: 32.1 g/dL (ref 30.0–36.5)
MCV: 83.7 fL (ref 80.0–99.0)
MPV: 9.7 fL (ref 8.9–12.9)
Nucleated RBCs: 0 /100{WBCs}
Platelets: 294 10*3/uL (ref 150–400)
RBC: 5.7 M/uL (ref 4.10–5.70)
RDW: 15.7 % — ABNORMAL HIGH (ref 11.5–14.5)
WBC: 5.3 10*3/uL (ref 4.1–11.1)
nRBC: 0 10*3/uL (ref 0.00–0.01)

## 2023-08-27 LAB — COMPREHENSIVE METABOLIC PANEL
ALT: 18 U/L (ref 12–78)
AST: <3 U/L — ABNORMAL LOW (ref 15–37)
Albumin/Globulin Ratio: 1 — ABNORMAL LOW (ref 1.1–2.2)
Albumin: 3.9 g/dL (ref 3.5–5.0)
Alk Phosphatase: 127 U/L — ABNORMAL HIGH (ref 45–117)
Anion Gap: 4 mmol/L (ref 2–12)
BUN/Creatinine Ratio: 17 (ref 12–20)
BUN: 20 mg/dL (ref 6–20)
CO2: 25 mmol/L (ref 21–32)
Calcium: 10 mg/dL (ref 8.5–10.1)
Chloride: 107 mmol/L (ref 97–108)
Creatinine: 1.21 mg/dL (ref 0.70–1.30)
Est, Glom Filt Rate: 63 mL/min/{1.73_m2} (ref >60)
Globulin: 3.8 g/dL (ref 2.0–4.0)
Glucose: 96 mg/dL (ref 65–100)
Potassium: 5 mmol/L (ref 3.5–5.1)
Sodium: 136 mmol/L (ref 136–145)
Total Bilirubin: 0.8 mg/dL (ref 0.2–1.0)
Total Protein: 7.7 g/dL (ref 6.4–8.2)

## 2023-08-27 LAB — LIPID PANEL
Chol/HDL Ratio: 4.4 (ref 0.0–5.0)
Cholesterol, Total: 185 mg/dL (ref <200)
HDL: 42 mg/dL
LDL Cholesterol: 115.8 mg/dL — ABNORMAL HIGH (ref 0–100)
Triglycerides: 136 mg/dL (ref <150)
VLDL Cholesterol Calculated: 27.2 mg/dL

## 2023-11-20 MED ORDER — LISINOPRIL-HYDROCHLOROTHIAZIDE 20-25 MG PO TABS
20-25 | ORAL_TABLET | Freq: Every day | ORAL | 3 refills | Status: AC
Start: 2023-11-20 — End: ?

## 2023-11-23 ENCOUNTER — Ambulatory Visit: Admit: 2023-11-23 | Discharge: 2023-11-23 | Payer: MEDICARE | Attending: Internal Medicine | Primary: Internal Medicine

## 2023-11-23 DIAGNOSIS — I10 Essential (primary) hypertension: Secondary | ICD-10-CM

## 2023-11-23 MED ORDER — OMEPRAZOLE 40 MG PO CPDR
40 | ORAL_CAPSULE | ORAL | 3 refills | Status: AC
Start: 2023-11-23 — End: ?

## 2023-11-23 NOTE — Patient Instructions (Signed)
 Starting a Weight-Loss Plan: Care Instructions  Overview    It can be a challenge to lose weight. But your doctor can help you make a weight-loss plan that meets your needs.  You don't have to make a lot of big changes at once. A better idea might be to focus on small changes and stick with them. When those changes become habit, you can add a few more changes.  Some people find it helpful to take an exercise or nutrition class. If you have questions, ask your doctor about seeing a registered dietitian or an exercise specialist. You might also think about joining a weight-loss support group.  If you're not ready to make changes right now, try to pick a date in the future. Then make an appointment with your doctor to talk about when and how you'll get started with a plan.  Follow-up care is a key part of your treatment and safety. Be sure to make and go to all appointments, and call your doctor if you are having problems. It's also a good idea to know your test results and keep a list of the medicines you take.  How can you care for yourself as you start a weight-loss plan?  Set realistic goals. Many people expect to lose much more weight than is likely. A weight loss of 5% to 10% of your body weight may be enough to improve your health.  Get family and friends involved to provide support. Talk to them about why you are trying to lose weight, and ask them to help. They can help by participating in exercise and having meals with you, even if they may be eating something different.  Find what works best for you. If you do not have time or do not like to cook, a program that offers meal replacement bars or shakes may be better for you. Or if you like to prepare meals, finding a plan that includes daily menus and recipes may be best.  Ask your doctor about other health professionals who can help you achieve your weight-loss goals.  A dietitian can help you make healthy changes in your diet.  An exercise specialist or  personal trainer can help you develop a safe and effective exercise program.  A counselor or psychiatrist can help you cope with issues such as depression, anxiety, or family problems that can make it hard to focus on weight loss.  Consider joining a support group for people who are trying to lose weight. Your doctor can suggest groups in your area.  Where can you learn more?  Go to [employer] and enter U357 to learn more about "Starting a Weight-Loss Plan: Care Instructions."  Current as of: January 19, 2023  Content Version: 14.3   [address], [place].   Care instructions adapted under license by [facility]. If you have questions about a medical condition or this instruction, always ask your healthcare professional. Larene Beach, [facility], disclaims any warranty or liability for your use of this information.         Advance Directives: Care Instructions  Overview  An advance directive is a legal way to state your wishes at the end of your life. It tells your family and your doctor what to do if you can't say what you want.  There are two main types of advance directives. You can change them any time your wishes change.  Living will.  This form tells your family and your doctor your wishes about life support  and other treatment. The form is also called a declaration.  Medical power of attorney.  This form lets you name a person to make treatment decisions for you when you can't speak for yourself. This person is called a health care agent (health care proxy, health care surrogate). The form is also called a durable power of attorney for health care.  If you do not have an advance directive, decisions about your medical care may be made by a family member, or by a doctor or a judge who doesn't know you.  It may help to think of an advance directive as a gift to the people who care for you. If you have one, they won't have to make tough decisions by themselves.  For more  information, including forms for your state, see the [employer] website ([employer]cy).  Follow-up care is a key part of your treatment and safety. Be sure to make and go to all appointments, and call your doctor if you are having problems. It's also a good idea to know your test results and keep a list of the medicines you take.  What should you include in an advance directive?  Many states have a unique advance directive form. (It may ask you to address specific issues.) Or you might use a universal form that's approved by many states.  If your form doesn't tell you what to address, it may be hard to know what to include in your advance directive. Use the questions below to help you get started.  Who do you want to make decisions about your medical care if you are not able to?  What life-support measures do you want if you have a serious illness that gets worse over time or can't be cured?  What are you most afraid of that might happen? (Maybe you're afraid of having pain, losing your independence, or being kept alive by machines.)  Where would you prefer to die? (Your home? A hospital? A nursing home?)  Do you want to donate your organs when you die?  Do you want certain religious practices performed before you die?  When should you call for help?  Be sure to contact your doctor if you have any questions.  Where can you learn more?  Go to [employer] and enter R264 to learn more about "Advance Directives: Care Instructions."  Current as of: August 06, 2022  Content Version: 14.3   [address], [place].   Care instructions adapted under license by [facility]. If you have questions about a medical condition or this instruction, always ask your healthcare professional. Larene [place], [facility], disclaims any warranty or liability for your use of this information.         A Healthy Heart: Care Instructions  Overview     Coronary artery disease,  also called heart disease, occurs when a substance called plaque builds up in the vessels that supply oxygen-rich blood to your heart muscle. This can narrow the blood vessels and reduce blood flow. A heart attack happens when blood flow is completely blocked. A high-fat diet, smoking, and other factors increase the risk of heart disease.  Your doctor has found that you have a chance of having heart disease. A heart-healthy lifestyle can help keep your heart healthy and prevent heart disease. This lifestyle includes eating healthy, being active, staying at a weight that's healthy for you, and not smoking or using tobacco. It also includes taking medicines as directed, managing other health conditions, and trying to  get a healthy amount of sleep.  Follow-up care is a key part of your treatment and safety. Be sure to make and go to all appointments, and call your doctor if you are having problems. It's also a good idea to know your test results and keep a list of the medicines you take.  How can you care for yourself at home?  Diet    Use less salt when you cook and eat. This helps lower your blood pressure. Taste food before salting. Add only a little salt when you think you need it. With time, your taste buds will adjust to less salt.     Eat fewer snack items, fast foods, canned soups, and other high-salt, high-fat, processed foods.     Read food labels and try to avoid saturated and trans fats. They increase your risk of heart disease by raising cholesterol levels.     Limit the amount of solid fat--butter, margarine, and shortening--you eat. Use olive, peanut, or canola oil when you cook. Bake, broil, and steam foods instead of frying them.     Eat a variety of fruit and vegetables every day. Dark green, deep orange, red, or yellow fruits and vegetables are especially good for you. Examples include spinach, carrots, peaches, and berries.     Foods high in fiber can reduce your cholesterol and provide important  vitamins and minerals. High-fiber foods include whole-grain cereals and breads, oatmeal, beans, brown rice, citrus fruits, and apples.     Eat lean proteins. Heart-healthy proteins include seafood, lean meats and poultry, eggs, beans, peas, nuts, seeds, and soy products.     Limit drinks and foods with added sugar. These include candy, desserts, and soda pop.   Heart-healthy lifestyle    If your doctor recommends it, get more exercise. For many people, walking is a good choice. Or you may want to swim, bike, or do other activities. Bit by bit, increase the time you're active every day. Try for at least 30 minutes on most days of the week.     Try to quit or cut back on using tobacco and other nicotine products. This includes smoking and vaping. If you need help quitting, talk to your doctor about stop-smoking programs and medicines. These can increase your chances of quitting for good. Quitting is one of the most important things you can do to protect your heart. It is never too late to quit. Try to avoid secondhand smoke too.     Stay at a weight that's healthy for you. Talk to your doctor if you need help losing weight.     Try to get 7 to 9 hours of sleep each night.     Limit alcohol to 2 drinks a day for men and 1 drink a day for women. Too much alcohol can cause health problems.     Manage other health problems such as diabetes, high blood pressure, and high cholesterol. If you think you may have a problem with alcohol or drug use, talk to your doctor.   Medicines    Take your medicines exactly as prescribed. Call your doctor if you think you are having a problem with your medicine.     If your doctor recommends aspirin, take the amount directed each day. Make sure you take aspirin and not another kind of pain reliever, such as acetaminophen (Tylenol).   When should you call for help?   Call 911 if you have symptoms of a heart attack. These may  include:    Chest pain or pressure, or a strange feeling in the  chest.     Sweating.     Shortness of breath.     Pain, pressure, or a strange feeling in the back, neck, jaw, or upper belly or in one or both shoulders or arms.     Lightheadedness or sudden weakness.     A fast or irregular heartbeat.   After you call 911, the operator may tell you to chew 1 adult-strength or 2 to 4 low-dose aspirin. Wait for an ambulance. Do not try to drive yourself.  Watch closely for changes in your health, and be sure to contact your doctor if you have any problems.  Where can you learn more?  Go to [employer] and enter F075 to learn more about "A Healthy Heart: Care Instructions."  Current as of: April 21, 2023  Content Version: 14.3   [address], [place].   Care instructions adapted under license by [facility]. If you have questions about a medical condition or this instruction, always ask your healthcare professional. Larene Beach, [facility], disclaims any warranty or liability for your use of this information.    Personalized Preventive Plan for Damon [name] - 11/23/2023  Medicare offers a range of preventive health benefits. Some of the tests and screenings are paid in full while other may be subject to a deductible, co-insurance, and/or copay.  Some of these benefits include a comprehensive review of your medical history including lifestyle, illnesses that may run in your family, and various assessments and screenings as appropriate.  After reviewing your medical record and screening and assessments performed today your provider may have ordered immunizations, labs, imaging, and/or referrals for you.  A list of these orders (if applicable) as well as your Preventive Care list are included within your After Visit Summary for your review.

## 2023-11-23 NOTE — Progress Notes (Addendum)
 Medicare Annual Wellness Visit    Damon [name] is here for Medicare AWV    Assessment & Plan   Essential (primary) hypertension  -     CBC; Future  -     Comprehensive Metabolic Panel; Future  -     ESTABLISHED, MOD MDM, 30-39 MIN [id number]  Malignant neoplasm of prostate (HCC)  Pure hypercholesterolemia, unspecified  -     ESTABLISHED, MOD MDM, 30-39 MIN [[id number]]  -     Lipid Panel; Future  Gastro-esophageal reflux disease without esophagitis  -     ESTABLISHED, MOD MDM, 30-39 MIN [09811]  Medicare annual wellness visit, subsequent       Return in 3 months (on 02/23/2024).     Subjective   Hypertension Review:  The patient has hypertension .  Diet and Lifestyle: generally follows a low sodium diet, exercises sporadically  Home BP Monitoring: is not measured at home.  Pertinent ROS: taking medications as instructed, no medication side effects noted, no TIA's, no chest pain on exertion, no dyspnea on exertion, no swelling of ankles.    Dyslipidemia Review:  Patient presents for evaluation of lipids.  Compliance with treatment thus far has been excellent.  A repeat fasting lipid profile was done.  The patient does not use medications that may worsen dyslipidemias . The patient exercises sporadically.    GERD Review:   Patient has a history of gastroesophageal reflux with heartburn. Symptoms have been present for a few months.  He denies dysphagia.  He  has not lost weight.  He denies melena, hematochezia, hematemesis, and coffee ground emesis.  This has been associated with fullness after meals.  He denies abdominal bloating and none.  Medical therapy in the past has included proton pump inhibitor    He has a longstanding history of prostate cancer and offers no new complaints today.    His last renal studies normalized    Patient's complete Health Risk Assessment and screening values have been reviewed and are found in Flowsheets. The following problems were reviewed today and where indicated follow up appointments  were made and/or referrals ordered.    Positive Risk Factor Screenings with Interventions:             General HRA Questions:           Abnormal BMI (obese):  Body mass index is 30.28 kg/m. (!) Abnormal  Interventions:  Patient declines any further evaluation or treatment             Advanced Directives:  Do you have a Living Will?: (!) No    Intervention:  has NO advanced directive - not interested in additional information                     Objective   Vitals:    11/23/23 0937   BP: 130/80   Site: Right Upper Arm   Position: Sitting   Cuff Size: Large Adult   Pulse: 81   Resp: 20   Temp: 98.1 F (36.7 C)   TempSrc: Oral   SpO2: 98%   Weight: 95.7 kg (211 lb)   Height: 1.778 m (5\' 10" )      Body mass index is 30.28 kg/m.   Physical Examination: General appearance - alert, well appearing, and in no distress  Mental status - alert, oriented to person, place, and time  Eyes - pupils equal and reactive, extraocular eye movements intact  Ears - bilateral TM's and external ear  canals normal  Nose - normal and patent, no erythema, discharge or polyps  Mouth - mucous membranes moist, pharynx normal without lesions  Neck - supple, no significant adenopathy  Lymphatics - no palpable lymphadenopathy, no hepatosplenomegaly  Chest - clear to auscultation, no wheezes, rales or rhonchi, symmetric air entry  Heart - normal rate, regular rhythm, normal S1, S2, no murmurs, rubs, clicks or gallops  Abdomen - soft, nontender, nondistended, no masses or organomegaly  Back exam - full range of motion, no tenderness, palpable spasm or pain on motion  Neurological - alert, oriented, normal speech, no focal findings or movement disorder noted  Musculoskeletal - no joint tenderness, deformity or swelling  Extremities - peripheral pulses normal, no pedal edema, no clubbing or cyanosis  Skin - normal coloration and turgor, no rashes, no suspicious skin lesions noted                 No Known Allergies  Prior to Visit Medications     Medication Sig Taking? Authorizing Provider   omeprazole (PRILOSEC) 40 MG delayed release capsule TAKE 1 CAPSULE BY MOUTH ONCE DAILY FOR STOMACH Yes Bronson Ing., MD   lisinopril-hydroCHLOROthiazide (PRINZIDE;ZESTORETIC) 20-25 MG per tablet Take 1 tablet by mouth once daily Yes Bronson Ing., MD   tamsulosin (FLOMAX) 0.4 MG capsule Take 1 capsule by mouth daily Yes Bronson Ing., MD   rosuvastatin (CRESTOR) 20 MG tablet Take 1 tablet by mouth nightly Take 1 tablet by mouth nightly Yes Bronson Ing., MD   NONFORMULARY  Yes [provider]   amLODIPine (NORVASC) 5 MG tablet Take 1 tablet by mouth once daily for blood pressure Yes Bronson Ing., MD   aspirin 81 MG EC tablet Take 1 tablet by mouth daily Yes Automatic Reconciliation, Ar       CareTeam (Including outside providers/suppliers regularly involved in providing care):   Patient Care Team:  Bronson Ing., MD as PCP - General  Chilton Si, Erasmo Score., MD as PCP - Empaneled Provider     Recommendations for Preventive Services Due: see orders and patient instructions/AVS.  Recommended screening schedule for the next 5-10 years is provided to the patient in written form: see Patient Instructions/AVS.     Reviewed and updated this visit:  Tobacco  Allergies  Meds  Med Hx  Surg Hx  Fam Hx  Sexual Hx

## 2023-11-23 NOTE — Progress Notes (Signed)
"  Have you been to the ER, urgent care clinic since your last visit?  Hospitalized since your last visit?"    no    "Have you seen or consulted any other health care providers outside our system since your last visit?"    no

## 2023-11-24 LAB — CBC
Hematocrit: 50.4 % — ABNORMAL HIGH (ref 36.6–50.3)
Hemoglobin: 16.2 g/dL (ref 12.1–17.0)
MCH: 26.6 pg (ref 26.0–34.0)
MCHC: 32.1 g/dL (ref 30.0–36.5)
MCV: 82.6 FL (ref 80.0–99.0)
MPV: 9.8 FL (ref 8.9–12.9)
Nucleated RBCs: 0 /100{WBCs}
Platelets: 332 10*3/uL (ref 150–400)
RBC: 6.1 M/uL — ABNORMAL HIGH (ref 4.10–5.70)
RDW: 16 % — ABNORMAL HIGH (ref 11.5–14.5)
WBC: 5.3 10*3/uL (ref 4.1–11.1)
nRBC: 0 10*3/uL (ref 0.00–0.01)

## 2023-11-24 LAB — COMPREHENSIVE METABOLIC PANEL
ALT: 22 U/L (ref 12–78)
AST: 12 U/L — ABNORMAL LOW (ref 15–37)
Albumin/Globulin Ratio: 1.1 (ref 1.1–2.2)
Albumin: 4.1 g/dL (ref 3.5–5.0)
Alk Phosphatase: 116 U/L (ref 45–117)
Anion Gap: 8 mmol/L (ref 2–12)
BUN/Creatinine Ratio: 12 (ref 12–20)
BUN: 17 mg/dL (ref 6–20)
CO2: 25 mmol/L (ref 21–32)
Calcium: 10.3 mg/dL — ABNORMAL HIGH (ref 8.5–10.1)
Chloride: 104 mmol/L (ref 97–108)
Creatinine: 1.44 mg/dL — ABNORMAL HIGH (ref 0.70–1.30)
Est, Glom Filt Rate: 51 mL/min/{1.73_m2} — ABNORMAL LOW (ref >60)
Globulin: 3.9 g/dL (ref 2.0–4.0)
Glucose: 119 mg/dL — ABNORMAL HIGH (ref 65–100)
Potassium: 4.7 mmol/L (ref 3.5–5.1)
Sodium: 137 mmol/L (ref 136–145)
Total Bilirubin: 1 mg/dL (ref 0.2–1.0)
Total Protein: 8 g/dL (ref 6.4–8.2)

## 2023-11-24 LAB — LIPID PANEL
Chol/HDL Ratio: 3.6 (ref 0.0–5.0)
Cholesterol, Total: 155 mg/dL (ref <200)
HDL: 43 mg/dL
LDL Cholesterol: 86.4 mg/dL (ref 0–100)
Triglycerides: 128 mg/dL (ref <150)
VLDL Cholesterol Calculated: 25.6 mg/dL

## 2024-02-22 ENCOUNTER — Ambulatory Visit
Admit: 2024-02-22 | Discharge: 2024-02-22 | Payer: Medicare (Managed Care) | Attending: Internal Medicine | Primary: Internal Medicine

## 2024-02-22 VITALS — BP 130/70 | HR 86 | Temp 98.50000°F | Resp 20 | Ht 70.0 in | Wt 209.0 lb

## 2024-02-22 DIAGNOSIS — I10 Essential (primary) hypertension: Secondary | ICD-10-CM

## 2024-02-22 NOTE — Progress Notes (Signed)
"  Have you been to the ER, urgent care clinic since your last visit?  Hospitalized since your last visit?"    no    "Have you seen or consulted any other health care providers outside our system since your last visit?"    no

## 2024-02-22 NOTE — Progress Notes (Signed)
 Damon [name] is a 73 y.o. male and presents with Hypertension and Cholesterol Problem  .  Subjective:  Hypertension Review:  The patient has hypertension .  Diet and Lifestyle: generally follows a low sodium diet, exercises sporadically  Home BP Monitoring: is not measured at home.  Pertinent ROS: taking medications as instructed, no medication side effects noted, no TIA's, no chest pain on exertion, no dyspnea on exertion, no swelling of ankles.    Dyslipidemia Review:  Patient presents for evaluation of lipids.  Compliance with treatment thus far has been excellent.  A repeat fasting lipid profile was done.  The patient does not use medications that may worsen dyslipidemias . The patient exercises sporadically.    GERD Review:   Patient has a history of gastroesophageal reflux with heartburn. Symptoms have been present for a few months.  He denies dysphagia.  He  has not lost weight.  He denies melena, hematochezia, hematemesis, and coffee ground emesis.  This has been associated with fullness after meals.  He denies abdominal bloating and none.  Medical therapy in the past has included proton pump inhibitor    He has a longstanding history of prostate cancer and offers no new complaints today.  His most recent PSA is unknown.he states he was told that it was normal by his urology doctor          Lab Results   Component Value Date    WBC 5.3 11/23/2023    HGB 16.2 11/23/2023    HCT 50.4 (H) 11/23/2023    PLT 332 11/23/2023    CHOL 155 11/23/2023    TRIG 128 11/23/2023    HDL 43 11/23/2023    ALT 22 11/23/2023    AST 12 (L) 11/23/2023    NA 137 11/23/2023    K 4.7 11/23/2023    CL 104 11/23/2023    CREATININE 1.44 (H) 11/23/2023    BUN 17 11/23/2023    CO2 25 11/23/2023    PSA 0.4 04/30/2022    LABA1C 6.1 (H) 11/16/2022           Review of Systems  Constitutional: negative for fevers, chills, anorexia and weight loss  Eyes:   negative for visual disturbance and irritation  ENT:   negative for tinnitus,sore  throat,nasal congestion,ear pains.hoarseness  Respiratory:  negative for cough, hemoptysis, dyspnea,wheezing  CV:   negative for chest pain, palpitations, lower extremity edema  GI:   negative for nausea, vomiting, diarrhea, abdominal pain,melena  Endo:               negative for polyuria,polydipsia,polyphagia,heat intolerance  Genitourinary: negative for frequency, dysuria and hematuria  Integument:  negative for rash and pruritus  Hematologic:  negative for easy bruising and gum/nose bleeding  Musculoskel: negative for myalgias, arthralgias, back pain, muscle weakness, joint pain  Neurological:  negative for headaches, dizziness, vertigo, memory problems and gait   Behavl/Psych: negative for feelings of anxiety, depression, mood changes    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     Cancer (HCC)     prostate    GERD (gastroesophageal reflux disease)     HTN (hypertension)     Hyperlipidemia      Past Surgical History:   Procedure Laterality Date    ANGIOPLASTY  iliac, prior to percutaneous Endovascular Aortic Repair (EVAR)    COLONOSCOPY N/A 11/17/2022    COLONOSCOPY, EGD performed by Latrelle Pole, MD at [facility]    ORTHOPEDIC SURGERY  04/2018    heel surgery    UPPER GASTROINTESTINAL ENDOSCOPY N/A 11/17/2022    ESOPHAGOGASTRODUODENOSCOPY BIOPSY performed by Latrelle Pole, MD at [facility]    VASCULAR SURGERY  06/18/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     Social History     Socioeconomic History    Marital status: Single     Spouse name: None    Number of children: None    Years of education: None    Highest education level: None   Tobacco Use    Smoking status: Former     Current packs/day: 0.50     Average packs/day: 0.5 packs/day for 56.2 years (28.1 ttl pk-yrs)     Types: Cigarettes     Start date: 12/11/1967    Smokeless tobacco: Former     Quit  date: 05/22/2015   Vaping Use    Vaping status: Never Used   Substance and Sexual Activity    Alcohol use: No     Alcohol/week: 0.0 standard drinks of alcohol    Drug use: No    Sexual activity: Yes     Partners: Female     Social Drivers of Psychologist, prison and probation services Strain: Low Risk  (08/25/2023)    Overall Financial Resource Strain (CARDIA)     Difficulty of Paying Living Expenses: Not hard at all   Food Insecurity: No Food Insecurity (02/22/2024)    Hunger Vital Sign     Worried About Running Out of Food in the Last Year: Never true     Ran Out of Food in the Last Year: Never true   Transportation Needs: No Transportation Needs (02/22/2024)    PRAPARE - Therapist, art (Medical): No     Lack of Transportation (Non-Medical): No   Physical Activity: Sufficiently Active (11/23/2023)    Exercise Vital Sign     Days of Exercise per Week: 5 days     Minutes of Exercise per Session: 60 min   Housing Stability: Low Risk  (02/22/2024)    Housing Stability Vital Sign     Unable to Pay for Housing in the Last Year: No     Number of Times Moved in the Last Year: 0     Homeless in the Last Year: No     Family History   Problem Relation Age of Onset    Diabetes Father     Heart Disease Father     Hypertension Father     Diabetes Paternal Uncle         deceased, 69's, heart trouble    Heart Disease Sister     Hypertension Sister     Heart Disease Brother     Hypertension Brother     Kidney Disease Sister     Hypertension Sister     Hypertension Mother     Kidney Disease Sister         on dialysis     Current Outpatient Medications   Medication Sig Dispense Refill    omeprazole  (PRILOSEC) 40 MG delayed release capsule TAKE 1 CAPSULE BY MOUTH ONCE DAILY FOR STOMACH 90 capsule 3    lisinopril -hydroCHLOROthiazide  (PRINZIDE ;ZESTORETIC ) 20-25 MG per tablet Take 1 tablet by mouth once daily 90 tablet 3    tamsulosin  (  FLOMAX ) 0.4 MG capsule Take 1 capsule by mouth daily 90 capsule 3    rosuvastatin  (CRESTOR ) 20 MG  tablet Take 1 tablet by mouth nightly Take 1 tablet by mouth nightly 90 tablet 3    NONFORMULARY       amLODIPine  (NORVASC ) 5 MG tablet Take 1 tablet by mouth once daily for blood pressure 90 tablet 3    aspirin 81 MG EC tablet Take 1 tablet by mouth daily       No current facility-administered medications for this visit.     No Known Allergies    Objective:  BP 130/70 (BP Site: Right Upper Arm, Patient Position: Sitting, BP Cuff Size: Large Adult)   Pulse 86   Temp 98.5 F (36.9 C) (Oral)   Resp 20   Ht 1.778 m (5\' 10" )   Wt 94.8 kg (209 lb)   SpO2 94%   BMI 29.99 kg/m   Physical Exam:   General appearance - alert, well appearing, and in no distress  Mental status - alert, oriented to person, place, and time  EYE-PERLA, EOMI, corneas normal, no foreign bodies  ENT-ENT exam normal, no neck nodes or sinus tenderness  Nose - normal and patent, no erythema, discharge or polyps  Mouth - mucous membranes moist, pharynx normal without lesions  Neck - supple, no significant adenopathy   Chest - clear to auscultation, no wheezes, rales or rhonchi, symmetric air entry   Heart - normal rate, regular rhythm, normal S1, S2, no murmurs, rubs, clicks or gallops   Abdomen - soft, nontender, nondistended, no masses or organomegaly  Lymph- no adenopathy palpable  Ext-peripheral pulses normal, no pedal edema, no clubbing or cyanosis  Skin-Warm and dry. no hyperpigmentation, vitiligo, or suspicious lesions  Neuro -alert, oriented, normal speech, no focal findings or movement disorder noted  Neck-normal C-spine, no tenderness, full ROM without pain  Feet-no nail deformities or callus formation with good pulses noted      No results found for this visit on 02/22/24.    Assessment/Plan:    ICD-10-CM    1. Essential (primary) hypertension  I10       2. Pure hypercholesterolemia, unspecified  E78.00       3. Malignant neoplasm of prostate (HCC)  C61       4. Gastro-esophageal reflux disease without esophagitis  K21.9         No  orders of the defined types were placed in this encounter.    lose weight, increase physical activity, bring BP log to office visit, follow low fat diet, follow low salt diet, call if any problems,  There are no Patient Instructions on file for this visit.         I have reviewed with the patient details of the assessment and plan and all questions were answered. Relevent patient education was performed.The most recent lab findings were reviewed with the patient.    An After Visit Summary was printed and given to the patient.

## 2024-02-23 LAB — CBC
Hematocrit: 43.3 % (ref 36.6–50.3)
Hemoglobin: 14 g/dL (ref 12.1–17.0)
MCH: 26.8 pg (ref 26.0–34.0)
MCHC: 32.3 g/dL (ref 30.0–36.5)
MCV: 83 FL (ref 80.0–99.0)
MPV: 10 FL (ref 8.9–12.9)
Nucleated RBCs: 0 /100{WBCs}
Platelets: 350 10*3/uL (ref 150–400)
RBC: 5.22 M/uL (ref 4.10–5.70)
RDW: 15.3 % — ABNORMAL HIGH (ref 11.5–14.5)
WBC: 8.8 10*3/uL (ref 4.1–11.1)
nRBC: 0 10*3/uL (ref 0.00–0.01)

## 2024-02-23 LAB — COMPREHENSIVE METABOLIC PANEL
ALT: 16 U/L (ref 12–78)
AST: 13 U/L — ABNORMAL LOW (ref 15–37)
Albumin/Globulin Ratio: 0.8 — ABNORMAL LOW (ref 1.1–2.2)
Albumin: 3.5 g/dL (ref 3.5–5.0)
Alk Phosphatase: 118 U/L — ABNORMAL HIGH (ref 45–117)
Anion Gap: 13 mmol/L — ABNORMAL HIGH (ref 2–12)
BUN/Creatinine Ratio: 17 (ref 12–20)
BUN: 24 mg/dL — ABNORMAL HIGH (ref 6–20)
CO2: 20 mmol/L — ABNORMAL LOW (ref 21–32)
Calcium: 10.1 mg/dL (ref 8.5–10.1)
Chloride: 104 mmol/L (ref 97–108)
Creatinine: 1.43 mg/dL — ABNORMAL HIGH (ref 0.70–1.30)
Est, Glom Filt Rate: 51 mL/min/{1.73_m2} — ABNORMAL LOW (ref >60)
Globulin: 4.4 g/dL — ABNORMAL HIGH (ref 2.0–4.0)
Glucose: 103 mg/dL — ABNORMAL HIGH (ref 65–100)
Potassium: 4.2 mmol/L (ref 3.5–5.1)
Sodium: 137 mmol/L (ref 136–145)
Total Bilirubin: 1.1 mg/dL — ABNORMAL HIGH (ref 0.2–1.0)
Total Protein: 7.9 g/dL (ref 6.4–8.2)

## 2024-05-16 MED ORDER — AMLODIPINE BESYLATE 5 MG PO TABS
5 | ORAL_TABLET | ORAL | 3 refills | Status: AC
Start: 2024-05-16 — End: ?

## 2024-05-24 ENCOUNTER — Ambulatory Visit
Admit: 2024-05-24 | Discharge: 2024-05-24 | Payer: Medicare (Managed Care) | Attending: Internal Medicine | Primary: Internal Medicine

## 2024-05-24 VITALS — BP 110/64 | HR 75 | Temp 98.20000°F | Resp 19 | Ht 70.0 in | Wt 202.0 lb

## 2024-05-24 DIAGNOSIS — I10 Essential (primary) hypertension: Principal | ICD-10-CM

## 2024-05-24 LAB — COMPREHENSIVE METABOLIC PANEL
ALT: 14 U/L (ref 10–50)
AST: 21 U/L (ref 10–50)
Albumin/Globulin Ratio: 1 — ABNORMAL LOW (ref 1.1–2.2)
Albumin: 3.8 g/dL (ref 3.5–5.2)
Alk Phosphatase: 98 U/L (ref 40–129)
Anion Gap: 14 mmol/L (ref 2–14)
BUN/Creatinine Ratio: 16 (ref 12–20)
BUN: 19 mg/dL (ref 8–23)
CO2: 22 mmol/L (ref 20–29)
Calcium: 10.2 mg/dL (ref 8.8–10.2)
Chloride: 102 mmol/L (ref 98–107)
Creatinine: 1.17 mg/dL (ref 0.70–1.20)
Est, Glom Filt Rate: 65 ml/min/1.73m2 (ref >59)
Globulin: 3.7 g/dL (ref 2.0–4.0)
Glucose: 98 mg/dL (ref 65–100)
Potassium: 4.8 mmol/L (ref 3.5–5.1)
Sodium: 138 mmol/L (ref 136–145)
Total Bilirubin: 0.7 mg/dL (ref 0.0–1.2)
Total Protein: 7.5 g/dL (ref 6.4–8.3)

## 2024-05-24 LAB — CBC
Hematocrit: 46.8 % (ref 36.6–50.3)
Hemoglobin: 15 g/dL (ref 12.1–17.0)
MCH: 26.5 pg (ref 26.0–34.0)
MCHC: 32.1 g/dL (ref 30.0–36.5)
MCV: 82.8 FL (ref 80.0–99.0)
MPV: 9.3 FL (ref 8.9–12.9)
Nucleated RBCs: 0 /100{WBCs}
Platelets: 353 K/uL (ref 150–400)
RBC: 5.65 M/uL (ref 4.10–5.70)
RDW: 16 % — ABNORMAL HIGH (ref 11.5–14.5)
WBC: 5.7 K/uL (ref 4.1–11.1)
nRBC: 0 K/uL (ref 0.00–0.01)

## 2024-05-24 MED ORDER — ROSUVASTATIN CALCIUM 20 MG PO TABS
20 | ORAL_TABLET | Freq: Every evening | ORAL | 3 refills | Status: AC
Start: 2024-05-24 — End: ?

## 2024-05-24 MED ORDER — TAMSULOSIN HCL 0.4 MG PO CAPS
0.4 | ORAL_CAPSULE | Freq: Every day | ORAL | 3 refills | Status: AC
Start: 2024-05-24 — End: ?

## 2024-05-24 NOTE — Progress Notes (Signed)
"  Have you been to the ER, urgent care clinic since your last visit?  Hospitalized since your last visit?"    no    "Have you seen or consulted any other health care providers outside our system since your last visit?"    no

## 2024-05-24 NOTE — Progress Notes (Addendum)
 Damon [name] is a 73 y.o. male and presents with Hypertension and Cholesterol Problem  .  Subjective:  Hypertension Review:  The patient has hypertension .  Diet and Lifestyle: generally follows a low sodium diet, exercises sporadically  Home BP Monitoring: is not measured at home.  Pertinent ROS: taking medications as instructed, no medication side effects noted, no TIA's, no chest pain on exertion, no dyspnea on exertion, no swelling of ankles.    Dyslipidemia Review:  Patient presents for evaluation of lipids.  Compliance with treatment thus far has been excellent.  A repeat fasting lipid profile was done.  The patient does not use medications that may worsen dyslipidemias . The patient exercises sporadically.    BPH Review:  He has no burning on urination,dysuria or dribbling reported.He has no frequency on urination.  He has been on Flomax  and tolerating it well.      Lab Results   Component Value Date    WBC 8.8 02/22/2024    HGB 14.0 02/22/2024    HCT 43.3 02/22/2024    PLT 350 02/22/2024    CHOL 155 11/23/2023    TRIG 128 11/23/2023    HDL 43 11/23/2023    ALT 16 02/22/2024    AST 13 (L) 02/22/2024    NA 137 02/22/2024    K 4.2 02/22/2024    CL 104 02/22/2024    CREATININE 1.43 (H) 02/22/2024    BUN 24 (H) 02/22/2024    CO2 20 (L) 02/22/2024    PSA 0.4 04/30/2022    LABA1C 6.1 (H) 11/16/2022     The last labs revealed pre-renal azotemia        Review of Systems  Constitutional: negative for fevers, chills, anorexia and weight loss  Eyes:   negative for visual disturbance and irritation  ENT:   negative for tinnitus,sore throat,nasal congestion,ear pains.hoarseness  Respiratory:  negative for cough, hemoptysis, dyspnea,wheezing  CV:   negative for chest pain, palpitations, lower extremity edema  GI:   negative for nausea, vomiting, diarrhea, abdominal pain,melena  Endo:               negative for polyuria,polydipsia,polyphagia,heat intolerance  Genitourinary: negative for frequency, dysuria and  hematuria  Integument:  negative for rash and pruritus  Hematologic:  negative for easy bruising and gum/nose bleeding  Musculoskel: negative for myalgias, arthralgias, back pain, muscle weakness, joint pain  Neurological:  negative for headaches, dizziness, vertigo, memory problems and gait   Behavl/Psych: negative for feelings of anxiety, depression, mood changes    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     Cancer (HCC)     prostate    GERD (gastroesophageal reflux disease)     HTN (hypertension)     Hyperlipidemia      Past Surgical History:   Procedure Laterality Date    ANGIOPLASTY      iliac, prior to percutaneous Endovascular Aortic Repair (EVAR)    COLONOSCOPY N/A 11/17/2022    COLONOSCOPY, EGD performed by Rosanna Gails, MD at [facility]    ORTHOPEDIC SURGERY  04/2018    heel surgery    UPPER GASTROINTESTINAL ENDOSCOPY N/A 11/17/2022    ESOPHAGOGASTRODUODENOSCOPY BIOPSY performed by Rosanna Gails, MD at [facility]    VASCULAR SURGERY  06/18/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  Social History     Socioeconomic History    Marital status: Single     Spouse name: None    Number of children: None    Years of education: None    Highest education level: None   Tobacco Use    Smoking status: Former     Current packs/day: 0.00     Average packs/day: 0.5 packs/day for 10.0 years (5.0 ttl pk-yrs)     Types: Cigarettes     Start date: 12/11/1967     Quit date: 12/10/1977     Years since quitting: 46.4     Passive exposure: Past    Smokeless tobacco: Former     Quit date: 05/22/2015   Vaping Use    Vaping status: Never Used   Substance and Sexual Activity    Alcohol use: No     Alcohol/week: 0.0 standard drinks of alcohol    Drug use: No    Sexual activity: Yes     Partners: Female     Social Drivers of Psychologist, prison and probation services Strain: Low Risk  (08/25/2023)     Overall Financial Resource Strain (CARDIA)     Difficulty of Paying Living Expenses: Not hard at all   Food Insecurity: No Food Insecurity (05/24/2024)    Hunger Vital Sign     Worried About Running Out of Food in the Last Year: Never true     Ran Out of Food in the Last Year: Never true   Transportation Needs: No Transportation Needs (05/24/2024)    PRAPARE - Therapist, art (Medical): No     Lack of Transportation (Non-Medical): No   Physical Activity: Sufficiently Active (11/23/2023)    Exercise Vital Sign     Days of Exercise per Week: 5 days     Minutes of Exercise per Session: 60 min   Housing Stability: Low Risk  (05/24/2024)    Housing Stability Vital Sign     Unable to Pay for Housing in the Last Year: No     Number of Times Moved in the Last Year: 0     Homeless in the Last Year: No     Family History   Problem Relation Age of Onset    Diabetes Father     Heart Disease Father     Hypertension Father     Diabetes Paternal Uncle         deceased, 56's, heart trouble    Heart Disease Sister     Hypertension Sister     Heart Disease Brother     Hypertension Brother     Kidney Disease Sister     Hypertension Sister     Hypertension Mother     Kidney Disease Sister         on dialysis     Current Outpatient Medications   Medication Sig Dispense Refill    tamsulosin  (FLOMAX ) 0.4 MG capsule Take 1 capsule by mouth daily 90 capsule 3    rosuvastatin  (CRESTOR ) 20 MG tablet Take 1 tablet by mouth nightly Take 1 tablet by mouth nightly 90 tablet 3    amLODIPine  (NORVASC ) 5 MG tablet Take 1 tablet by mouth once daily for blood pressure 90 tablet 3    omeprazole  (PRILOSEC) 40 MG delayed release capsule TAKE 1 CAPSULE BY MOUTH ONCE DAILY FOR STOMACH 90 capsule 3    lisinopril -hydroCHLOROthiazide  (PRINZIDE ;ZESTORETIC ) 20-25 MG per tablet Take 1 tablet by mouth once daily 90  tablet 3    NONFORMULARY       aspirin 81 MG EC tablet Take 1 tablet by mouth daily       No current facility-administered  medications for this visit.     No Known Allergies    Objective:  BP 110/64 (BP Site: Right Upper Arm, Patient Position: Sitting, BP Cuff Size: Large Adult)   Pulse 75   Temp 98.2 F (36.8 C) (Oral)   Resp 19   Ht 1.778 m (5' 10)   Wt 91.6 kg (202 lb)   SpO2 96%   BMI 28.98 kg/m   Physical Exam:   General appearance - alert, well appearing, and in no distress  Mental status - alert, oriented to person, place, and time  EYE-PERLA, EOMI, corneas normal, no foreign bodies  ENT-ENT exam normal, no neck nodes or sinus tenderness  Nose - normal and patent, no erythema, discharge or polyps  Mouth - mucous membranes moist, pharynx normal without lesions  Neck - supple, no significant adenopathy   Chest - clear to auscultation, no wheezes, rales or rhonchi, symmetric air entry   Heart - normal rate, regular rhythm, normal S1, S2, no murmurs, rubs, clicks or gallops   Abdomen - soft, nontender, nondistended, no masses or organomegaly  Lymph- no adenopathy palpable  Ext-peripheral pulses normal, no pedal edema, no clubbing or cyanosis  Skin-Warm and dry. no hyperpigmentation, vitiligo, or suspicious lesions  Neuro -alert, oriented, normal speech, no focal findings or movement disorder noted  Neck-normal C-spine, no tenderness, full ROM without pain  Feet-no nail deformities or callus formation with good pulses noted      No results found for this visit on 05/24/24.    Assessment/Plan:    ICD-10-CM    1. Essential (primary) hypertension  I10 CBC     Comprehensive Metabolic Panel      2. Pure hypercholesterolemia, unspecified  E78.00       3. Malignant neoplasm of prostate (HCC)  C61       4. Prerenal azotemia  R79.89         Orders Placed This Encounter   Procedures    CBC     Standing Status:   Future     Expected Date:   05/24/2024     Expiration Date:   05/24/2025    Comprehensive Metabolic Panel     Standing Status:   Future     Expected Date:   05/24/2024     Expiration Date:   05/24/2025     follow low fat diet, follow  low salt diet, routine labs ordered, call if any problems,  There are no Patient Instructions on file for this visit.   Follow-up and Dispositions    Return in about 3 months (around 08/23/2024), or if symptoms worsen or fail to improve.             I have reviewed with the patient details of the assessment and plan and all questions were answered. Relevent patient education was performed.The most recent lab findings were reviewed with the patient.    An After Visit Summary was printed and given to the patient.

## 2024-08-23 ENCOUNTER — Encounter: Payer: Medicare (Managed Care) | Attending: Internal Medicine | Primary: Internal Medicine

## 2024-09-19 ENCOUNTER — Ambulatory Visit: Admit: 2024-09-19 | Discharge: 2024-09-19 | Payer: Medicare (Managed Care)

## 2024-09-19 NOTE — Progress Notes (Addendum)
 "    Damon [name] October 29, 1950 73 y.o. male Existing patient here for evaluation of the following chief complaint(s): New Patient and Establish Care       HPI    Patient here to establish care. All current and past medical history reviewed. Family history reviewed. Current medication regimen reviewed. Patient chronic medical problems include HTN, HLD and GERD. Patient has history of Prostate Cancer, AAA 2016. History of colon polyps.  History of right heel fracture after fall.    HTN. BP controlled. Taking amlodipine , Lisinopril  hydrochlorothiazide  daily. No chest pain, palpitation or leg swelling.     HLD. Taking Rosuvastatin  daily. Denies muscle pain . Tolerating statin. Discussed low fat diet.     History of AAA repaired with great in 2016. Patient has not followed up with vascular since a couple the procedure. Will send to vascular to determine if annual follow ups are needed. Patient denies any abdominal or chest pain.  BP controlled.    History of Prostate CA. Treated with 20 treatments of radiation. Taking Flomax  daily. Denies nocturia or hematuria. Sees urologist annually.  Patient at [facility].    GERD.Taking Omeprazole . Discussed avoiding acidic, spicy or fried foods. Avoiding caffiene and Alcohol. Eat 3 hours before laying down.            Acute concern today: None    Assessment & Plan  Primary hypertension   Chronic at goal continue amlodipine  lisinopril  and hydrochlorothiazide   Discussed DASH diet and healthy eating       Hyperlipidemia LDL goal <100   Chronic condition continue rosuvastatin  daily  Low-fat diet.  Patient given plate method information to help with portion sizes  Orders:    Lipid Panel; Future    Screening for diabetes mellitus       Orders:    Hemoglobin A1C; Future    Encounter to establish care   New patient    Orders:    CBC with Auto Differential; Future    Comprehensive Metabolic Panel; Future    History of prostate cancer   Managed by urology  Patient taking Flomax   daily  Orders:    PSA, Diagnostic; Future    History of AAA (abdominal aortic aneurysm) repair   AAA in 2016  Will refer to vascular to evaluate if annual follow-up is needed  Orders:    [facility] - Delores Purchase, MD, Vascular Surgery, [place]           Return in about 3 months (around 12/18/2024), or if symptoms worsen or fail to improve.       SUBJECTIVE/OBJECTIVE:     Review of Symptoms     Review of Systems   Constitutional:  Negative for appetite change, fatigue, fever and unexpected weight change.   HENT:  Negative for congestion, ear pain, facial swelling, rhinorrhea, sinus pain, sneezing, sore throat and tinnitus.    Eyes:  Negative for photophobia, pain, itching and visual disturbance.   Respiratory:  Negative for cough, chest tightness, shortness of breath and wheezing.    Cardiovascular:  Negative for chest pain, palpitations and leg swelling.   Gastrointestinal:  Negative for abdominal pain, anal bleeding, blood in stool, constipation, diarrhea, nausea and vomiting.   Endocrine: Negative for cold intolerance, heat intolerance, polydipsia, polyphagia and polyuria.   Genitourinary: Negative for difficulty urinating, dysuria, frequency, hematuria and urgency.   Musculoskeletal:  Negative for arthralgias, back pain and joint swelling.   Skin:  Negative for color change and wound.   Allergic/Immunologic: Negative  for environmental allergies and food allergies.   Neurological:  Negative for dizziness, weakness, numbness and headaches.   Hematological:  Negative for adenopathy. Does not bruise/bleed easily.   Psychiatric/Behavioral:  Negative for confusion, sleep disturbance and suicidal ideas. The patient is not nervous/anxious.      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     Cancer (HCC)     prostate    GERD (gastroesophageal reflux disease)     HTN  (hypertension)     Hyperlipidemia         Past Surgical History:   Procedure Laterality Date    ANGIOPLASTY      iliac, prior to percutaneous Endovascular Aortic Repair (EVAR)    COLONOSCOPY N/A 11/17/2022    COLONOSCOPY, EGD performed by Rosanna Gails, MD at [facility]    ORTHOPEDIC SURGERY  04/2018    heel surgery    UPPER GASTROINTESTINAL ENDOSCOPY N/A 11/17/2022    ESOPHAGOGASTRODUODENOSCOPY BIOPSY performed by Rosanna Gails, MD at [facility]    VASCULAR SURGERY  06/18/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        Family History   Problem Relation Age of Onset    Diabetes Father     Heart Disease Father     Hypertension Father     Diabetes Paternal Uncle         deceased, 28's, heart trouble    Heart Disease Sister     Hypertension Sister     Heart Disease Brother     Hypertension Brother     Kidney Disease Sister     Hypertension Sister     Hypertension Mother     Kidney Disease Sister         on dialysis        Social History     Socioeconomic History    Marital status: Single     Spouse name: None    Number of children: None    Years of education: None    Highest education level: None   Tobacco Use    Smoking status: Former     Current packs/day: 0.00     Average packs/day: 0.5 packs/day for 10.0 years (5.0 ttl pk-yrs)     Types: Cigarettes     Start date: 12/11/1967     Quit date: 12/10/1977     Years since quitting: 46.8     Passive exposure: Past    Smokeless tobacco: Former     Quit date: 05/22/2015   Vaping Use    Vaping status: Never Used   Substance and Sexual Activity    Alcohol use: No     Alcohol/week: 0.0 standard drinks of alcohol    Drug use: No    Sexual activity: Yes     Partners: Female     Social Drivers of Psychologist, [employer] Strain: Low Risk  (08/25/2023)    Overall Financial Resource Strain (CARDIA)     Difficulty of Paying Living Expenses: Not hard at all   Food Insecurity: No Food Insecurity (05/24/2024)    Hunger Vital Sign     Worried About Running Out of Food in the Last Year: Never true      Ran Out of Food in the Last Year: Never true   Transportation Needs: No  Transportation Needs (05/24/2024)    PRAPARE - Therapist, Art (Medical): No     Lack of Transportation (Non-Medical): No   Physical Activity: Sufficiently Active (11/23/2023)    Exercise Vital Sign     Days of Exercise per Week: 5 days     Minutes of Exercise per Session: 60 min   Housing Stability: Low Risk  (05/24/2024)    Housing Stability Vital Sign     Unable to Pay for Housing in the Last Year: No     Number of Times Moved in the Last Year: 0     Homeless in the Last Year: No             Office Visit on 05/24/2024   Component Date Value Ref Range Status    Sodium 05/24/2024 138  136 - 145 mmol/L Final    Potassium 05/24/2024 4.8  3.5 - 5.1 mmol/L Final    Chloride 05/24/2024 102  98 - 107 mmol/L Final    CO2 05/24/2024 22  20 - 29 mmol/L Final    Anion Gap 05/24/2024 14  2 - 14 mmol/L Final    Glucose 05/24/2024 98  65 - 100 mg/dL Final    Comment: <29 mg/dL Consistent with, but not fully diagnostic of hypoglycemia.  100 - 125 mg/dL Impaired fasting glucose/consistent with pre-diabetes mellitus.  > 126 mg/dl Fasting glucose consistent with overt diabetes mellitus      BUN 05/24/2024 19  8 - 23 MG/DL Final    Creatinine 90/96/7974 1.17  0.70 - 1.20 MG/DL Final    BUN/Creatinine Ratio 05/24/2024 16  12 - 20   Final    Est, Glom Filt Rate 05/24/2024 65  >59 ml/min/1.21m2 Final    Comment:   Pediatric calculator link: https://www.kidney.org/professionals/kdoqi/gfr_calculatorped    These results are not intended for use in patients <37 years of age.    eGFR results are calculated without a race factor using  the 2021 CKD-EPI equation. Careful clinical correlation is recommended, particularly when comparing to results calculated using previous equations.  The CKD-EPI equation is less accurate in patients with extremes of muscle mass, extra-renal metabolism of creatinine, excessive creatine ingestion, or following therapy  that affects renal tubular secretion.      Calcium  05/24/2024 10.2  8.8 - 10.2 MG/DL Final    Total Bilirubin 05/24/2024 0.7  0.0 - 1.2 MG/DL Final    ALT 90/96/7974 14  10 - 50 U/L Final    AST 05/24/2024 21  10 - 50 U/L Final    Alk Phosphatase 05/24/2024 98  40 - 129 U/L Final    Total Protein 05/24/2024 7.5  6.4 - 8.3 g/dL Final    Albumin 90/96/7974 3.8  3.5 - 5.2 g/dL Final    Globulin 90/96/7974 3.7  2.0 - 4.0 g/dL Final    Albumin/Globulin Ratio 05/24/2024 1.0 (L)  1.1 - 2.2   Final    WBC 05/24/2024 5.7  4.1 - 11.1 K/uL Final    RBC 05/24/2024 5.65  4.10 - 5.70 M/uL Final    Hemoglobin 05/24/2024 15.0  12.1 - 17.0 g/dL Final    Hematocrit 90/96/7974 46.8  36.6 - 50.3 % Final    MCV 05/24/2024 82.8  80.0 - 99.0 FL Final    MCH 05/24/2024 26.5  26.0 - 34.0 PG Final    MCHC 05/24/2024 32.1  30.0 - 36.5 g/dL Final    RDW 90/96/7974 16.0 (H)  11.5 - 14.5 % Final  Platelets 05/24/2024 353  150 - 400 K/uL Final    MPV 05/24/2024 9.3  8.9 - 12.9 FL Final    Nucleated RBCs 05/24/2024 0.0  0 PER 100 WBC Final    nRBC 05/24/2024 0.00  0.00 - 0.01 K/uL Final        No data recorded     Physical Exam     Physical Exam      Constitutional:       General: He is not in acute distress.     Appearance: Normal appearance.   HENT:      Head: Normocephalic.      Right Ear: Tympanic membrane, ear canal and external ear normal.      Left Ear: Tympanic membrane, ear canal and external ear normal.      Nose: Nose normal. No congestion or rhinorrhea.      Mouth/Throat:      Mouth: Mucous membranes are moist.      Pharynx: Oropharynx is clear. No oropharyngeal exudate or posterior oropharyngeal erythema.   Eyes:      Conjunctiva/sclera: Conjunctivae normal.      Pupils: Pupils are equal, round, and reactive to light.   Cardiovascular:      Rate and Rhythm: Normal rate and regular rhythm.      Pulses: Normal pulses.      Heart sounds: No murmur heard.     No friction rub. No gallop.   Pulmonary:      Effort: Pulmonary effort is  normal. No respiratory distress.      Breath sounds: Normal breath sounds. No wheezing.   Abdominal:      General: Bowel sounds are normal. There is no distension.      Palpations: There is no mass.      Tenderness: There is no abdominal tenderness. There is no right CVA tenderness or left CVA tenderness.      Hernia: No hernia is present.   Musculoskeletal:         General: No swelling, tenderness or signs of injury. Normal range of motion.      Cervical back: Normal range of motion.      Right lower leg: No edema.      Left lower leg: No edema.   Skin:     General: Skin is warm and dry.      Findings: No erythema, lesion or rash.   Neurological:      Mental Status: She is alert and oriented to person, place, and time.      Sensory: No sensory deficit.      Motor: No weakness.      Gait: Gait normal.   Psychiatric:         Mood and Affect: Mood normal.         Judgment: Judgment normal.     I have reviewed with the patient details of the assessment and plan and all questions were answered. Relevent patient education was performed.The most recent lab findings were reviewed with the patient. Prescribed medication side effects and warnings were discussed with patient. Patient verbalized understanding of all things discussed.    On this date 09/19/2024 I have spent 30 minutes reviewing previous notes, test results and face to face with the patient discussing the diagnosis and importance of compliance with the treatment plan as well as documenting on the day of the visit.       An After Visit Summary was printed and given to the patient.          "

## 2024-09-19 NOTE — Assessment & Plan Note (Addendum)
"   Chronic at goal continue amlodipine  lisinopril  and hydrochlorothiazide   Discussed DASH diet and healthy eating       "

## 2024-09-19 NOTE — Progress Notes (Signed)
"  Chief Complaint   Patient presents with    New Patient    Establish Care     1. Have you been to the ER, urgent care clinic since your last visit?  Hospitalized since your last visit?No    2. Have you seen or consulted any other health care providers outside of the [facility] since your last visit?  Include any pap smears or colon screening. No    "

## 2024-09-20 LAB — LIPID PANEL
Chol/HDL Ratio: 4 (ref 0.0–5.0)
Cholesterol, Total: 181 mg/dL (ref 0–200)
HDL: 45 mg/dL (ref 40–60)
LDL Cholesterol: 112 mg/dL — ABNORMAL HIGH (ref 0–100)
Triglycerides: 121 mg/dL (ref 0–150)
VLDL Cholesterol Calculated: 24 mg/dL

## 2024-09-20 LAB — CBC WITH AUTO DIFFERENTIAL
Basophils %: 0.4 % (ref 0.0–1.0)
Basophils Absolute: 0.03 K/UL (ref 0.00–0.10)
Eosinophils %: 6.5 % (ref 0.0–7.0)
Eosinophils Absolute: 0.47 K/UL — ABNORMAL HIGH (ref 0.00–0.40)
Hematocrit: 46 % (ref 36.6–50.3)
Hemoglobin: 15.1 g/dL (ref 12.1–17.0)
Immature Granulocytes %: 0.3 % (ref 0.0–0.5)
Immature Granulocytes Absolute: 0.02 K/UL (ref 0.00–0.04)
Lymphocytes %: 20.3 % (ref 12.0–49.0)
Lymphocytes Absolute: 1.47 K/UL (ref 0.80–3.50)
MCH: 26.2 pg (ref 26.0–34.0)
MCHC: 32.8 g/dL (ref 30.0–36.5)
MCV: 79.9 FL — ABNORMAL LOW (ref 80.0–99.0)
MPV: 9.8 FL (ref 8.9–12.9)
Monocytes %: 7.5 % (ref 5.0–13.0)
Monocytes Absolute: 0.54 K/UL (ref 0.00–1.00)
Neutrophils %: 65 % (ref 32.0–75.0)
Neutrophils Absolute: 4.71 K/UL (ref 1.80–8.00)
Nucleated RBCs: 0 /100{WBCs}
Platelets: 324 K/uL (ref 150–400)
RBC: 5.76 M/uL — ABNORMAL HIGH (ref 4.10–5.70)
RDW: 15.2 % — ABNORMAL HIGH (ref 11.5–14.5)
WBC: 7.2 K/uL (ref 4.1–11.1)
nRBC: 0 K/uL (ref 0.00–0.01)

## 2024-09-20 LAB — COMPREHENSIVE METABOLIC PANEL
Albumin/Globulin Ratio: 0.9 — ABNORMAL LOW (ref 1.1–2.2)
Albumin: 3.9 g/dL (ref 3.5–5.2)
Anion Gap: 14 mmol/L (ref 2–14)
BUN: 21 mg/dL (ref 8–23)
CO2: 23 mmol/L (ref 20–29)
Calcium: 10.2 mg/dL (ref 8.8–10.2)
Chloride: 97 mmol/L — ABNORMAL LOW (ref 98–107)
Creatinine: 1.35 mg/dL — ABNORMAL HIGH (ref 0.70–1.20)
Est, Glom Filt Rate: 55 ml/min/1.73m2 — ABNORMAL LOW (ref >59)
Globulin: 4.1 g/dL — ABNORMAL HIGH (ref 2.0–4.0)
Glucose: 110 mg/dL — ABNORMAL HIGH (ref 65–100)
Sodium: 134 mmol/L — ABNORMAL LOW (ref 136–145)
Total Bilirubin: 0.9 mg/dL (ref 0.0–1.2)
Total Protein: 7.9 g/dL (ref 6.4–8.3)

## 2024-09-20 LAB — PSA, DIAGNOSTIC: PSA: 0.4 ng/mL (ref 0–4.4)

## 2024-09-20 LAB — HEMOGLOBIN A1C
Estimated Avg Glucose: 138 mg/dL
Hemoglobin A1C: 6.5 % — ABNORMAL HIGH (ref 4.0–5.6)
